# Patient Record
Sex: Male | Born: 1965 | Race: White | Hispanic: No | Marital: Married | Smoking: Current every day smoker
Health system: Southern US, Community
[De-identification: ages and names within clinical notes are randomized; demographics above are authoritative.]

## PROBLEM LIST (undated history)

## (undated) DIAGNOSIS — C189 Malignant neoplasm of colon, unspecified: Secondary | ICD-10-CM

## (undated) DIAGNOSIS — E782 Mixed hyperlipidemia: Secondary | ICD-10-CM

## (undated) DIAGNOSIS — C801 Malignant (primary) neoplasm, unspecified: Secondary | ICD-10-CM

## (undated) DIAGNOSIS — I429 Cardiomyopathy, unspecified: Secondary | ICD-10-CM

## (undated) DIAGNOSIS — J449 Chronic obstructive pulmonary disease, unspecified: Secondary | ICD-10-CM

## (undated) DIAGNOSIS — I10 Essential (primary) hypertension: Secondary | ICD-10-CM

## (undated) DIAGNOSIS — K432 Incisional hernia without obstruction or gangrene: Secondary | ICD-10-CM

## (undated) DIAGNOSIS — I350 Nonrheumatic aortic (valve) stenosis: Secondary | ICD-10-CM

## (undated) DIAGNOSIS — I2510 Atherosclerotic heart disease of native coronary artery without angina pectoris: Secondary | ICD-10-CM

## (undated) DIAGNOSIS — N529 Male erectile dysfunction, unspecified: Secondary | ICD-10-CM

## (undated) DIAGNOSIS — Z8701 Personal history of pneumonia (recurrent): Secondary | ICD-10-CM

## (undated) HISTORY — PX: LIVER RESECTION: SHX1977

## (undated) HISTORY — PX: COLOSTOMY: SHX63

## (undated) HISTORY — PX: HERNIA REPAIR: SHX51

## (undated) HISTORY — PX: COLON SURGERY: SHX602

## (undated) HISTORY — PX: COLOSTOMY REVERSAL: SHX5782

---

## 2014-03-29 DIAGNOSIS — R079 Chest pain, unspecified: Secondary | ICD-10-CM | POA: Insufficient documentation

## 2014-03-29 DIAGNOSIS — D696 Thrombocytopenia, unspecified: Secondary | ICD-10-CM | POA: Insufficient documentation

## 2015-04-25 DIAGNOSIS — M6208 Separation of muscle (nontraumatic), other site: Secondary | ICD-10-CM | POA: Insufficient documentation

## 2017-10-03 ENCOUNTER — Emergency Department (HOSPITAL_COMMUNITY)
Admission: EM | Admit: 2017-10-03 | Discharge: 2017-10-03 | Disposition: A | Discharge status: Home / Self Care | Payer: BLUE CROSS/BLUE SHIELD | Attending: Emergency Medicine | Admitting: Emergency Medicine

## 2017-10-03 ENCOUNTER — Emergency Department (HOSPITAL_COMMUNITY): Payer: BLUE CROSS/BLUE SHIELD

## 2017-10-03 ENCOUNTER — Encounter (HOSPITAL_COMMUNITY): Payer: Self-pay | Admitting: Emergency Medicine

## 2017-10-03 DIAGNOSIS — F17210 Nicotine dependence, cigarettes, uncomplicated: Secondary | ICD-10-CM | POA: Diagnosis not present

## 2017-10-03 DIAGNOSIS — R05 Cough: Secondary | ICD-10-CM | POA: Diagnosis present

## 2017-10-03 DIAGNOSIS — R1011 Right upper quadrant pain: Secondary | ICD-10-CM | POA: Insufficient documentation

## 2017-10-03 DIAGNOSIS — J189 Pneumonia, unspecified organism: Secondary | ICD-10-CM

## 2017-10-03 DIAGNOSIS — Z8505 Personal history of malignant neoplasm of liver: Secondary | ICD-10-CM | POA: Diagnosis not present

## 2017-10-03 DIAGNOSIS — J181 Lobar pneumonia, unspecified organism: Secondary | ICD-10-CM | POA: Insufficient documentation

## 2017-10-03 DIAGNOSIS — Z85858 Personal history of malignant neoplasm of other endocrine glands: Secondary | ICD-10-CM | POA: Diagnosis not present

## 2017-10-03 DIAGNOSIS — R1030 Lower abdominal pain, unspecified: Secondary | ICD-10-CM

## 2017-10-03 DIAGNOSIS — Z85038 Personal history of other malignant neoplasm of large intestine: Secondary | ICD-10-CM | POA: Insufficient documentation

## 2017-10-03 HISTORY — DX: Malignant (primary) neoplasm, unspecified: C80.1

## 2017-10-03 LAB — COMPREHENSIVE METABOLIC PANEL
ALK PHOS: 76 U/L (ref 38–126)
ALT: 24 U/L (ref 17–63)
ANION GAP: 10 (ref 5–15)
AST: 28 U/L (ref 15–41)
Albumin: 3.5 g/dL (ref 3.5–5.0)
BILIRUBIN TOTAL: 0.5 mg/dL (ref 0.3–1.2)
BUN: 10 mg/dL (ref 6–20)
CALCIUM: 8.6 mg/dL — AB (ref 8.9–10.3)
CO2: 22 mmol/L (ref 22–32)
Chloride: 102 mmol/L (ref 101–111)
Creatinine, Ser: 1.19 mg/dL (ref 0.61–1.24)
GFR calc Af Amer: >60 mL/min (ref >60)
GFR calc non Af Amer: >60 mL/min (ref >60)
GLUCOSE: 88 mg/dL (ref 65–99)
Potassium: 4.4 mmol/L (ref 3.5–5.1)
SODIUM: 134 mmol/L — AB (ref 135–145)
Total Protein: 6.6 g/dL (ref 6.5–8.1)

## 2017-10-03 LAB — CBC WITH DIFFERENTIAL/PLATELET
Basophils Absolute: 0 10*3/uL (ref 0.0–0.1)
Basophils Relative: 1 %
EOS ABS: 0 10*3/uL (ref 0.0–0.7)
Eosinophils Relative: 1 %
HEMATOCRIT: 46.6 % (ref 39.0–52.0)
HEMOGLOBIN: 15.2 g/dL (ref 13.0–17.0)
LYMPHS ABS: 1.3 10*3/uL (ref 0.7–4.0)
Lymphocytes Relative: 27 %
MCH: 30.5 pg (ref 26.0–34.0)
MCHC: 32.6 g/dL (ref 30.0–36.0)
MCV: 93.4 fL (ref 78.0–100.0)
MONO ABS: 0.7 10*3/uL (ref 0.1–1.0)
MONOS PCT: 15 %
NEUTROS ABS: 2.8 10*3/uL (ref 1.7–7.7)
NEUTROS PCT: 56 %
Platelets: 91 10*3/uL — ABNORMAL LOW (ref 150–400)
RBC: 4.99 MIL/uL (ref 4.22–5.81)
RDW: 13.8 % (ref 11.5–15.5)
WBC: 4.8 10*3/uL (ref 4.0–10.5)

## 2017-10-03 LAB — LIPASE, BLOOD: Lipase: 28 U/L (ref 11–51)

## 2017-10-03 MED ORDER — MORPHINE SULFATE (PF) 4 MG/ML IV SOLN
4.0000 mg | Freq: Once | INTRAVENOUS | Status: AC
  Administered 2017-10-03: 4 mg via INTRAVENOUS
  Filled 2017-10-03: qty 1

## 2017-10-03 MED ORDER — AZITHROMYCIN 250 MG PO TABS
500.0000 mg | ORAL_TABLET | Freq: Once | ORAL | Status: AC
  Administered 2017-10-03: 500 mg via ORAL
  Filled 2017-10-03: qty 2

## 2017-10-03 MED ORDER — BENZONATATE 100 MG PO CAPS
200.0000 mg | ORAL_CAPSULE | Freq: Once | ORAL | Status: AC
  Administered 2017-10-03: 200 mg via ORAL
  Filled 2017-10-03: qty 2

## 2017-10-03 MED ORDER — FENTANYL CITRATE (PF) 100 MCG/2ML IJ SOLN
50.0000 ug | Freq: Once | INTRAMUSCULAR | Status: AC
  Administered 2017-10-03: 50 ug via INTRAVENOUS
  Filled 2017-10-03: qty 2

## 2017-10-03 MED ORDER — ALBUTEROL SULFATE (2.5 MG/3ML) 0.083% IN NEBU: 2.5000 mg | INHALATION_SOLUTION | Freq: Four times a day (QID) | RESPIRATORY_TRACT | 0 refills | Status: DC | PRN

## 2017-10-03 MED ORDER — IPRATROPIUM-ALBUTEROL 0.5-2.5 (3) MG/3ML IN SOLN
3.0000 mL | Freq: Once | RESPIRATORY_TRACT | Status: AC
  Administered 2017-10-03: 3 mL via RESPIRATORY_TRACT
  Filled 2017-10-03: qty 3

## 2017-10-03 MED ORDER — AZITHROMYCIN 250 MG PO TABS: ORAL_TABLET | ORAL | 0 refills | Status: DC

## 2017-10-03 MED ORDER — AMOXICILLIN-POT CLAVULANATE 875-125 MG PO TABS
1.0000 | ORAL_TABLET | Freq: Once | ORAL | Status: AC
  Administered 2017-10-03: 1 via ORAL
  Filled 2017-10-03: qty 1

## 2017-10-03 MED ORDER — ONDANSETRON HCL 4 MG/2ML IJ SOLN
4.0000 mg | Freq: Once | INTRAMUSCULAR | Status: AC
  Administered 2017-10-03: 4 mg via INTRAVENOUS
  Filled 2017-10-03: qty 2

## 2017-10-03 MED ORDER — HYDROCOD POLST-CPM POLST ER 10-8 MG/5ML PO SUER: 5.0000 mL | Freq: Two times a day (BID) | ORAL | 0 refills | Status: DC

## 2017-10-03 MED ORDER — AMOXICILLIN-POT CLAVULANATE 875-125 MG PO TABS: 1.0000 | ORAL_TABLET | Freq: Two times a day (BID) | ORAL | 0 refills | Status: DC

## 2017-10-03 NOTE — ED Provider Notes (Signed)
[facility] EMERGENCY DEPARTMENT Provider Note   CSN: [id number] Arrival date & time: 10/03/17  0631     History   Chief Complaint Chief Complaint  Patient presents with  . Cough  . Fever    HPI Bob Ross is a 52 y.o. male.  HPI   Bob Ross is a 52 y.o. male with hx of multiple abdominal surgeries secondary to colon surgery, colostomy, and hernia repair with mesh placement.  He presents to the Emergency Department complaining of cough, generalized body aches and fever.  Symptoms began 2 days ago.  Fever at home of 101 this morning before taking tylenol.  Cough reported as mostly non-productive, but states he has been coughing excessively and believes he may have "torn" his abdominal mesh. Cough has been associated with nasal congestion.  Denies ear pain, sore throat.   He complains of sharp, stabbing pain to his upper and right abdomen that worsens with cough.  He denies chest pain, shortness of breath, vomiting, diarrhea and constipation.  No flu vaccine this year.  Pt is a smoker    Past Medical History:  Diagnosis Date  . 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   Past Surgical History:  Procedure Laterality Date  . COLON SURGERY    . COLOSTOMY    . COLOSTOMY REVERSAL    . HERNIA REPAIR    . LIVER RESECTION         Home Medications    Prior to Admission medications   Not on File    Family History History reviewed. No pertinent family history.  Social History Social History   Tobacco Use  . Smoking status: Current Every Day Smoker    Packs/day: 2.00    Types: Cigarettes  Substance Use Topics  . Alcohol use: Yes    Comment: occasional  . Drug use: No     Allergies   Patient has no known allergies.   Review of Systems Review of Systems  Constitutional: Negative for appetite change, chills and fever.  HENT: Positive for congestion. Negative for sore throat and trouble swallowing.     Respiratory: Positive for cough. Negative for chest tightness, shortness of breath and wheezing.   Cardiovascular: Negative for chest pain.  Gastrointestinal: Positive for abdominal pain. Negative for abdominal distention, constipation, diarrhea, nausea and vomiting.  Genitourinary: Negative for dysuria and flank pain.  Musculoskeletal: Negative for arthralgias.  Skin: Negative for rash.  Neurological: Negative for dizziness, weakness and numbness.  Hematological: Negative for adenopathy.  All other systems reviewed and are negative.    Physical Exam Updated Vital Signs BP 122/78   Pulse 74   Temp 99.4 F (37.4 C) (Oral)   Resp 18   Ht 5\' 11"  (1.803 m)   Wt 93 kg (205 lb)   SpO2 96%   BMI 28.59 kg/m   Physical Exam  Constitutional: He is oriented to person, place, and time. He appears well-developed and well-nourished. No distress.  HENT:  Head: Normocephalic and atraumatic.  Right Ear: Tympanic membrane and ear canal normal.  Left Ear: Tympanic membrane and ear canal normal.  Mouth/Throat: Uvula is midline, oropharynx is clear and moist and mucous membranes are normal. No oropharyngeal exudate.  Eyes: EOM are normal. Pupils are equal, round, and reactive to light.  Neck: Normal range of motion, full passive range of motion without pain and phonation normal. Neck supple.  Cardiovascular: Normal rate,  regular rhythm, normal heart sounds and intact distal pulses.  No murmur heard. Pulmonary/Chest: Effort normal. No stridor. No respiratory distress. He has wheezes. He has no rales. He exhibits no tenderness.  Few crackles present at the right base.  Also, few expiratory wheezes throughout.    Abdominal: Soft. He exhibits no distension and no mass. There is tenderness. There is no rebound and no guarding.  Mild tenderness to palpation of the right lower abdomen.  No guarding or rebound tenderness.  No palpable hernias of the abdominal wall  Musculoskeletal: Normal range of  motion. He exhibits no edema.  Lymphadenopathy:    He has no cervical adenopathy.  Neurological: He is alert and oriented to person, place, and time. He exhibits normal muscle tone. Coordination normal.  Skin: Skin is warm and dry. Capillary refill takes less than 2 seconds. No rash noted.  Psychiatric: He has a normal mood and affect.  Nursing note and vitals reviewed.    ED Treatments / Results  Labs (all labs ordered are listed, but only abnormal results are displayed) Labs Reviewed  COMPREHENSIVE METABOLIC PANEL - Abnormal; Notable for the following components:      Result Value   Sodium 134 (*)    Calcium 8.6 (*)    All other components within normal limits  CBC WITH DIFFERENTIAL/PLATELET - Abnormal; Notable for the following components:   Platelets 91 (*)    All other components within normal limits  LIPASE, BLOOD    EKG  EKG Interpretation None       Radiology Ct Abdomen Pelvis Wo Contrast  Result Date: 10/03/2017 CLINICAL DATA:  52 year old male with a history of prior ventral abdominal mesh hernia repair and new onset abdominal pain after coughing. EXAM: CT ABDOMEN AND PELVIS WITHOUT CONTRAST TECHNIQUE: Multidetector CT imaging of the abdomen and pelvis was performed following the standard protocol without IV contrast. COMPARISON:  None. FINDINGS: Lower chest: Mild linear atelectasis in the right lower lobe. Otherwise, the lungs are clear. No pleural effusion or pneumothorax. The visualized cardiac structures are within normal limits for size. Unremarkable distal thoracic esophagus. Hepatobiliary: Normal hepatic contour and morphology. Linear calcification in the right hepatic lobe may represent sequelae from prior intervention, or old granulomatous disease. No intra or extrahepatic biliary ductal dilatation. A solitary radiopaque stone is present in the gallbladder neck. Pancreas: Unremarkable. No pancreatic ductal dilatation or surrounding inflammatory changes. Spleen:  Normal in size without focal abnormality. Adrenals/Urinary Tract: Atrophic native right kidney. There are a few punctate calcifications affiliated with the right adrenal gland. The left adrenal gland and kidney are normal in appearance. No evidence of hydronephrosis, nephrolithiasis or exophytic renal mass. The ureters are unremarkable. The bladder is within normal limits save for a few punctate calcifications in the region of the urachus. Stomach/Bowel: History of prior sigmoid colonic resection. The colocolonic anastomosis is widely patent. No focal bowel wall thickening or obstruction. Calcified mesenteric mass in the right lower quadrant measures approximately 2.2 x 5.2 cm. There is a second smaller calcified mass in the right lower quadrant small bowel mesentery which measures 1.4 x 1.4 cm. Vascular/Lymphatic: Limited evaluation in the absence of intravenous contrast. Scattered atherosclerotic calcifications including along the abdominal aorta. No aneurysm. Calcified upper abdominal lymph nodes in the gastrohepatic ligament, the largest measures 2.1 x 2.1 cm. Reproductive: Unremarkable prostate gland. Other: Significant diastasis recti with evidence of prior large ventral hernia repair with mesh. The repair appears predominantly intact. There may be a single defect in the left  anterolateral lower abdomen were a small amount of omental fat protrudes through the mesh. Multiple loops of small bowel are closely adherent to the undersurface of the mesh. Suspect underlying adhesive disease. Musculoskeletal: No acute fracture or aggressive appearing lytic or blastic osseous lesion. IMPRESSION: 1. Evidence of prior large ventral hernia repair with mesh. There is 1 site of possible recurrent herniation of omental fat in the anterolateral aspect of the lower abdominal wall. The acuity of this abnormality can't be determined without prior imaging for comparison. If this corresponds with the location of the patient's  clinical symptoms, it may be acute. 2. Evidence of prior treated colon cancer. The patient has had a sigmoid colectomy and there are calcifications within the liver, gastrohepatic nodal station and within the small bowel mesentery of the right lower quadrant which likely represent treated metastatic disease. Again, correlation with prior imaging would be helpful to ensure stability. No suspicious uncalcified lymphadenopathy or soft tissue masses identified. 3. 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 4.  Aortic Atherosclerosis (ICD10-170.0). 5. Cholelithiasis. Electronically Signed   By: Jacqulynn Cadet M.D.   On: 10/03/2017 09:36   Dg Chest 2 View  Result Date: 10/03/2017 CLINICAL DATA:  Cough and fever for 1 day. EXAM: CHEST  2 VIEW COMPARISON:  None. FINDINGS: The cardiomediastinal silhouette is unremarkable. Streaky opacities within the right lower lung are noted with small right pleural effusion/pleural thickening. A peripheral opacity within the right lower lung noted. The left lung is clear. COPD/emphysema noted. There is no evidence of pneumothorax. No acute bony abnormalities are present. IMPRESSION: Streaky right lower lung opacities with small right pleural effusion/pleural thickening. This is of uncertain chronicity but could represent pneumonia with small parapneumonic effusion. Peripheral opacity within the right lower lung which could represent scarring changes or atelectasis. Recommend comparison with any prior outside studies. If these cannot be obtained, radiographic follow-up to resolution is recommended. COPD/emphysema. Electronically Signed   By: Margarette Canada M.D.   On: 10/03/2017 07:53    Procedures Procedures (including critical care time)  Medications Ordered in ED Medications  ipratropium-albuterol (DUONEB) 0.5-2.5 (3) MG/3ML nebulizer solution 3 mL (3 mLs Nebulization Given 10/03/17 0910)  morphine 4 MG/ML injection 4 mg (4 mg  Intravenous Given 10/03/17 0842)  ondansetron (ZOFRAN) injection 4 mg (4 mg Intravenous Given 10/03/17 0842)  fentaNYL (SUBLIMAZE) injection 50 mcg (50 mcg Intravenous Given 10/03/17 1020)  amoxicillin-clavulanate (AUGMENTIN) 875-125 MG per tablet 1 tablet (1 tablet Oral Given 10/03/17 1019)  azithromycin (ZITHROMAX) tablet 500 mg (500 mg Oral Given 10/03/17 1019)  benzonatate (TESSALON) capsule 200 mg (200 mg Oral Given 10/03/17 1019)     Initial Impression / Assessment and Plan / ED Course  I have reviewed the triage vital signs and the nursing notes.  Pertinent labs & imaging results that were available during my care of the patient were reviewed by me and considered in my medical decision making (see chart for details).    On recheck, lung sounds have improved after albuterol.  Patient overall well-appearing.  Vitals reviewed.  Low-grade fever without tachypnea, tachycardia, or hypoxia.  Discussed imaging results with patient, pain to the abdomen is epigastric and right lower quadrant. small possible compromise of the mesh on the left felt to be related to previous surgeries and given that patient's symptoms are epigastric and right lower abdomen.  There are no previous imaging results or laboratory studies in his medical records for comparison  Feeling better after medication.  Patient appears stable for outpatient treatment of pneumonia  Pt ambulated in the dept maintained O2 sat of 95% on RA.  Patient agrees to treatment plan, appears safe for discharge home, strict return precautions were discussed      Final Clinical Impressions(s) / ED Diagnoses   Final diagnoses:  Community acquired pneumonia of right lower lobe of lung [facility])  Lower abdominal pain    ED Discharge Orders    None       Kem Parkinson, PA-C 10/04/17 2053    Francine Graven, DO 10/05/17 [phone]

## 2017-10-03 NOTE — Discharge Instructions (Signed)
Drink plenty of fluids, use the albuterol nebulizer 1 treatment every 4-6 hours as needed.  Be sure to take the antibiotic as directed until finished.  Follow-up with your primary doctor in 2-3 weeks to ensure resolution of the pneumonia.  As discussed, your CT of your abdomen shows a possible area of compromise to the mesh, he will need to arrange follow-up with your surgeon.  Return to the ER for any worsening symptoms such as persistent fever, vomiting, and shortness of breath.

## 2017-10-03 NOTE — ED Notes (Signed)
RT contacted regarding DuoNeb order.

## 2017-10-03 NOTE — ED Notes (Signed)
Patient transported to CT 

## 2017-10-03 NOTE — ED Triage Notes (Signed)
Pt reports having a cough and fever since yesterday.  States hx of several abd sx with mesh repair and feels like he "did some damage" to abdomen while coughing.

## 2017-10-09 ENCOUNTER — Emergency Department (HOSPITAL_COMMUNITY): Payer: BLUE CROSS/BLUE SHIELD

## 2017-10-09 ENCOUNTER — Emergency Department (HOSPITAL_COMMUNITY)
Admission: EM | Admit: 2017-10-09 | Discharge: 2017-10-09 | Disposition: A | Discharge status: Home / Self Care | Payer: BLUE CROSS/BLUE SHIELD | Attending: Emergency Medicine | Admitting: Emergency Medicine

## 2017-10-09 ENCOUNTER — Encounter (HOSPITAL_COMMUNITY): Payer: Self-pay

## 2017-10-09 DIAGNOSIS — F17210 Nicotine dependence, cigarettes, uncomplicated: Secondary | ICD-10-CM | POA: Diagnosis not present

## 2017-10-09 DIAGNOSIS — R0602 Shortness of breath: Secondary | ICD-10-CM | POA: Diagnosis not present

## 2017-10-09 DIAGNOSIS — R05 Cough: Secondary | ICD-10-CM | POA: Insufficient documentation

## 2017-10-09 DIAGNOSIS — Z79899 Other long term (current) drug therapy: Secondary | ICD-10-CM | POA: Insufficient documentation

## 2017-10-09 DIAGNOSIS — R059 Cough, unspecified: Secondary | ICD-10-CM

## 2017-10-09 DIAGNOSIS — J441 Chronic obstructive pulmonary disease with (acute) exacerbation: Secondary | ICD-10-CM | POA: Insufficient documentation

## 2017-10-09 DIAGNOSIS — R509 Fever, unspecified: Secondary | ICD-10-CM | POA: Diagnosis present

## 2017-10-09 LAB — COMPREHENSIVE METABOLIC PANEL
ALBUMIN: 3.3 g/dL — AB (ref 3.5–5.0)
ALK PHOS: 79 U/L (ref 38–126)
ALT: 20 U/L (ref 17–63)
AST: 21 U/L (ref 15–41)
Anion gap: 9 (ref 5–15)
BILIRUBIN TOTAL: 0.5 mg/dL (ref 0.3–1.2)
BUN: 11 mg/dL (ref 6–20)
CALCIUM: 9.1 mg/dL (ref 8.9–10.3)
CO2: 26 mmol/L (ref 22–32)
CREATININE: 1.12 mg/dL (ref 0.61–1.24)
Chloride: 103 mmol/L (ref 101–111)
GFR calc Af Amer: >60 mL/min (ref >60)
GFR calc non Af Amer: >60 mL/min (ref >60)
GLUCOSE: 91 mg/dL (ref 65–99)
Potassium: 4.6 mmol/L (ref 3.5–5.1)
Sodium: 138 mmol/L (ref 135–145)
TOTAL PROTEIN: 7.2 g/dL (ref 6.5–8.1)

## 2017-10-09 LAB — CBC WITH DIFFERENTIAL/PLATELET
BASOS ABS: 0 10*3/uL (ref 0.0–0.1)
Basophils Relative: 0 %
Eosinophils Absolute: 0.1 10*3/uL (ref 0.0–0.7)
Eosinophils Relative: 2 %
HEMATOCRIT: 48.9 % (ref 39.0–52.0)
Hemoglobin: 15.9 g/dL (ref 13.0–17.0)
LYMPHS ABS: 2.3 10*3/uL (ref 0.7–4.0)
Lymphocytes Relative: 39 %
MCH: 30.3 pg (ref 26.0–34.0)
MCHC: 32.5 g/dL (ref 30.0–36.0)
MCV: 93.3 fL (ref 78.0–100.0)
MONO ABS: 0.6 10*3/uL (ref 0.1–1.0)
Monocytes Relative: 10 %
NEUTROS ABS: 3 10*3/uL (ref 1.7–7.7)
Neutrophils Relative %: 49 %
PLATELETS: 104 10*3/uL — AB (ref 150–400)
RBC: 5.24 MIL/uL (ref 4.22–5.81)
RDW: 13.7 % (ref 11.5–15.5)
WBC: 6 10*3/uL (ref 4.0–10.5)

## 2017-10-09 LAB — INFLUENZA PANEL BY PCR (TYPE A & B)
Influenza A By PCR: NEGATIVE
Influenza B By PCR: NEGATIVE

## 2017-10-09 LAB — BRAIN NATRIURETIC PEPTIDE: B NATRIURETIC PEPTIDE 5: 19 pg/mL (ref 0.0–100.0)

## 2017-10-09 LAB — I-STAT CG4 LACTIC ACID, ED: LACTIC ACID, VENOUS: 1.02 mmol/L (ref 0.5–1.9)

## 2017-10-09 MED ORDER — IPRATROPIUM-ALBUTEROL 0.5-2.5 (3) MG/3ML IN SOLN
3.0000 mL | Freq: Once | RESPIRATORY_TRACT | Status: AC
  Administered 2017-10-09: 3 mL via RESPIRATORY_TRACT
  Filled 2017-10-09: qty 3

## 2017-10-09 MED ORDER — BENZONATATE 100 MG PO CAPS: 100.0000 mg | ORAL_CAPSULE | Freq: Three times a day (TID) | ORAL | 0 refills | Status: DC

## 2017-10-09 MED ORDER — HYDROCOD POLST-CPM POLST ER 10-8 MG/5ML PO SUER: 5.0000 mL | Freq: Every evening | ORAL | 0 refills | Status: DC | PRN

## 2017-10-09 MED ORDER — PREDNISONE 20 MG PO TABS: 40.0000 mg | ORAL_TABLET | Freq: Every day | ORAL | 0 refills | Status: AC

## 2017-10-09 MED ORDER — SODIUM CHLORIDE 0.9 % IV BOLUS (SEPSIS): 1000.0000 mL | Freq: Once | INTRAVENOUS | Status: DC

## 2017-10-09 NOTE — ED Notes (Signed)
resp called.

## 2017-10-09 NOTE — Discharge Instructions (Signed)
Please use your breathing treatment every 4-6 hours as needed.  Please make sure that you use it first thing in the morning, and immediately before you go to bed.  Please stop smoking.  I have given you a prescription for steroids today.  Some common side effects include feelings of extra energy, feeling warm, increased appetite, and stomach upset.  If you are diabetic your sugars may run higher than usual.

## 2017-10-09 NOTE — ED Notes (Signed)
Pt is clear voiced, non dyspneic,   Reports no flu shot this year  Has not followed with CP since last sat   To Rad

## 2017-10-09 NOTE — ED Provider Notes (Signed)
[facility] EMERGENCY DEPARTMENT Provider Note   CSN: [id number] Arrival date & time: 10/09/17  0935     History   Chief Complaint Chief Complaint  Patient presents with  . Pneumonia  . Follow-up    HPI Bob Ross is a 52 y.o. male who presents today for follow-up of diagnosed pneumonia.  He was seen here on 2/9 (5 days ago) and diagnosed with pneumonia.  He reports compliance with his azithromycin and Augmentin, along with albuterol nebulizers.  He reports that he had maybe one day when he felt a little bit better, however generally reports no change.  His wife reports that he has lost approximately 10 pounds in the past week.  He denies any nausea vomiting or diarrhea.  Wife reports that he is having "low-grade" fevers, however never over 100.  Wife reports that when they measured his oxygen saturation at home today it was in the 80s.  Patient has smoked approximately 2 packs a day for 39 years. Patient has a history of colon cancer that spread to his liver.    HPI  Past Medical History:  Diagnosis Date  . 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   Past Surgical History:  Procedure Laterality Date  . COLON SURGERY    . COLOSTOMY    . COLOSTOMY REVERSAL    . HERNIA REPAIR    . LIVER RESECTION         Home Medications    Prior to Admission medications   Medication Sig Start Date End Date Taking? Authorizing Provider  albuterol (PROVENTIL) (2.5 MG/3ML) 0.083% nebulizer solution Take 3 mLs (2.5 mg total) by nebulization every 6 (six) hours as needed for wheezing or shortness of breath. 10/03/17  Yes Triplett, Tammy, PA-C  amoxicillin-clavulanate (AUGMENTIN) 875-125 MG tablet Take 1 tablet by mouth every 12 (twelve) hours. 10/03/17  Yes Triplett, Tammy, PA-C  aspirin EC 81 MG tablet Take 81 mg by mouth daily.   Yes [provider]  atorvastatin (LIPITOR) 80 MG tablet Take 80 mg by mouth daily at 6 PM.   09/28/17  Yes [provider]  azithromycin (ZITHROMAX) 250 MG tablet Take first 2 tablets together, then 1 every day until finished. 10/03/17  Yes Triplett, Tammy, PA-C  benzonatate (TESSALON) 100 MG capsule Take 1 capsule (100 mg total) by mouth every 8 (eight) hours. 10/09/17   Lorin Glass, PA-C  chlorpheniramine-HYDROcodone (TUSSIONEX PENNKINETIC ER) 10-8 MG/5ML SUER Take 5 mLs by mouth at bedtime as needed for cough. 10/09/17   Lorin Glass, PA-C  predniSONE (DELTASONE) 20 MG tablet Take 2 tablets (40 mg total) by mouth daily for 5 days. 10/09/17 10/14/17  Lorin Glass, PA-C    Family History No family history on file.  Social History Social History   Tobacco Use  . Smoking status: Current Every Day Smoker    Packs/day: 2.00    Types: Cigarettes  Substance Use Topics  . Alcohol use: Yes    Comment: occasional  . Drug use: No     Allergies   Patient has no known allergies.   Review of Systems Review of Systems  Constitutional: Positive for activity change, appetite change, chills, fatigue, fever and unexpected weight change.  HENT: Positive for congestion and sore throat.   Respiratory: Positive for cough and shortness of breath. Negative for chest tightness and wheezing.   Cardiovascular: Negative for chest pain, palpitations  and leg swelling.  Gastrointestinal: Positive for abdominal pain. Negative for diarrhea, nausea and vomiting.  Genitourinary: Negative for decreased urine volume, dysuria and hematuria.  Musculoskeletal: Positive for arthralgias and myalgias.  Skin: Negative for rash and wound.  Neurological: Positive for weakness ("all over"). Negative for speech difficulty and headaches.  All other systems reviewed and are negative.    Physical Exam Updated Vital Signs BP 123/77   Pulse 79   Temp 97.8 F (36.6 C) (Oral)   Resp (!) 22   Wt 90.3 kg (199 lb)   SpO2 95%   BMI 27.75 kg/m   Physical Exam  Constitutional: He  appears well-developed and well-nourished. No distress.  HENT:  Head: Normocephalic and atraumatic.  Mouth/Throat: Oropharynx is clear and moist.  Eyes: Conjunctivae are normal. Right eye exhibits no discharge. Left eye exhibits no discharge. No scleral icterus.  Neck: Normal range of motion. Neck supple.  Cardiovascular: Normal rate, regular rhythm, normal heart sounds and intact distal pulses.  No murmur heard. Pulmonary/Chest: Effort normal. No stridor. No respiratory distress.  Diffuse wheezes/rales through all lung fields.   Abdominal: Soft. Bowel sounds are normal. He exhibits no distension. There is no tenderness.  Musculoskeletal: He exhibits no edema or deformity.  No leg swelling.   Neurological: He is alert. He exhibits normal muscle tone.  Skin: Skin is warm and dry. He is not diaphoretic.  Psychiatric: He has a normal mood and affect. His behavior is normal.  Nursing note and vitals reviewed.    ED Treatments / Results  Labs (all labs ordered are listed, but only abnormal results are displayed) Labs Reviewed  COMPREHENSIVE METABOLIC PANEL - Abnormal; Notable for the following components:      Result Value   Albumin 3.3 (*)    All other components within normal limits  CBC WITH DIFFERENTIAL/PLATELET - Abnormal; Notable for the following components:   Platelets 104 (*)    All other components within normal limits  BRAIN NATRIURETIC PEPTIDE  INFLUENZA PANEL BY PCR (TYPE A & B)  I-STAT CG4 LACTIC ACID, ED    EKG  EKG Interpretation None       Radiology Dg Chest 2 View  Result Date: 10/09/2017 CLINICAL DATA:  Productive cough and congestion. EXAM: CHEST  2 VIEW COMPARISON:  10/03/2017 FINDINGS: The cardiac silhouette, mediastinal and hilar contours are normal in stable. Persistent right lower lobe process which appears to be chronic scarring changes and pleural thickening. The left lung is clear. The bony structures are intact. IMPRESSION: Chronic appearing right  basilar scarring changes and pleural thickening. No obvious infiltrate. Electronically Signed   By: Marijo Sanes M.D.   On: 10/09/2017 11:50    Procedures Procedures (including critical care time)  Medications Ordered in ED Medications  ipratropium-albuterol (DUONEB) 0.5-2.5 (3) MG/3ML nebulizer solution 3 mL (3 mLs Nebulization Given 10/09/17 1101)     Initial Impression / Assessment and Plan / ED Course  I have reviewed the triage vital signs and the nursing notes.  Pertinent labs & imaging results that were available during my care of the patient were reviewed by me and considered in my medical decision making (see chart for details).  Clinical Course as of Oct 09 1838  Fri Oct 09, 2017  1128 Lungs are now greatly improved, there is still mild Rales in bilateral lower fields, left worse than right.  [EH]  1138 Asked by RN to cancel IV fluids as patient is normal vitals, able to tolerate Po.   [  EH]  1300 Patient reevaluated, no respiratory distress, resting comfortably at this time.  Discussed discharge plan.  [EH]    Clinical Course User Index [EH] Lorin Glass, PA-C   Landis Martins presents today for evaluation of pneumonia.  He was previously diagnosed with pneumonia and started on azithromycin and Augmentin.  He reports that he is not getting better as he expects that he should be.  He is continuing to have significant cough, and his wife reports that he has lost approximately 10 pounds.  Labs were obtained, no significant leukocytosis.  Platelets are low, however improved since his labs 6 days ago.  No significant electrolyte abnormalities.  Based on reported consistent cough BNP was obtained which was not elevated.  Actiq acid normal, influenza a and B negative.  Chest x-ray shows improvement in pneumonia.  The course and expectations for pneumonia resolution were discussed with the patient and his wife.  Patient has been on appropriate antibiotic therapy.  Given his history of  approximately 80 pack years, will add on steroids to treat this as a acute exacerbation of COPD.  He reports that he has sufficient albuterol nebs at home, was not using them, was encouraged to use them.  He was given a small amount of cough syrup, as he reports that is the only thing that allows him to sleep.  He was informed to only use this at night, and given Tessalon to use during the day.  He was instructed to follow-up with his primary care provider.  He was maintaining his sats on room air.  I do not suspect PE based on course, symptoms, wells score 0 points, perc score 0 points.   At this time there does not appear to be any evidence of an acute emergency medical condition and the patient appears stable for discharge with appropriate outpatient follow up.Diagnosis was discussed with patient who verbalizes understanding and is agreeable to discharge. Pt case discussed with Dr. Lita Mains who agrees with my plan.       Final Clinical Impressions(s) / ED Diagnoses   Final diagnoses:  COPD with acute exacerbation ([place])  Cough    ED Discharge Orders        Ordered    chlorpheniramine-HYDROcodone (TUSSIONEX PENNKINETIC ER) 10-8 MG/5ML SUER  At bedtime PRN     10/09/17 1302    benzonatate (TESSALON) 100 MG capsule  Every 8 hours     10/09/17 1302    predniSONE (DELTASONE) 20 MG tablet  Daily     10/09/17 1302       Lorin Glass, [place] 10/09/17 1840    Julianne Rice, MD 10/10/17 2149

## 2017-10-09 NOTE — ED Notes (Signed)
Pt offered POs and crackers- spouse reports that he wanted a coke-  When informed that department has no cokes, spouse report she will have to go to vending machines

## 2017-10-09 NOTE — ED Triage Notes (Signed)
Pt in for recheck . Reports he was seen last Sat for pneumonia and does not feel like he is improving. conts to cough and SOB

## 2018-08-13 DIAGNOSIS — S46212A Strain of muscle, fascia and tendon of other parts of biceps, left arm, initial encounter: Secondary | ICD-10-CM | POA: Insufficient documentation

## 2020-01-19 DIAGNOSIS — R0602 Shortness of breath: Secondary | ICD-10-CM | POA: Insufficient documentation

## 2020-01-19 DIAGNOSIS — I2510 Atherosclerotic heart disease of native coronary artery without angina pectoris: Secondary | ICD-10-CM | POA: Insufficient documentation

## 2021-03-29 ENCOUNTER — Emergency Department (HOSPITAL_COMMUNITY)
Admission: EM | Admit: 2021-03-29 | Discharge: 2021-03-29 | Disposition: A | Discharge status: Home / Self Care | Payer: BC Managed Care – PPO | Attending: Emergency Medicine | Admitting: Emergency Medicine

## 2021-03-29 ENCOUNTER — Encounter (HOSPITAL_COMMUNITY): Payer: Self-pay | Admitting: *Deleted

## 2021-03-29 ENCOUNTER — Other Ambulatory Visit: Payer: Self-pay

## 2021-03-29 ENCOUNTER — Emergency Department (HOSPITAL_COMMUNITY): Payer: BC Managed Care – PPO

## 2021-03-29 DIAGNOSIS — R5383 Other fatigue: Secondary | ICD-10-CM | POA: Insufficient documentation

## 2021-03-29 DIAGNOSIS — Z7951 Long term (current) use of inhaled steroids: Secondary | ICD-10-CM | POA: Diagnosis not present

## 2021-03-29 DIAGNOSIS — Z7982 Long term (current) use of aspirin: Secondary | ICD-10-CM | POA: Insufficient documentation

## 2021-03-29 DIAGNOSIS — Z79899 Other long term (current) drug therapy: Secondary | ICD-10-CM | POA: Insufficient documentation

## 2021-03-29 DIAGNOSIS — J441 Chronic obstructive pulmonary disease with (acute) exacerbation: Secondary | ICD-10-CM | POA: Diagnosis not present

## 2021-03-29 DIAGNOSIS — Z85038 Personal history of other malignant neoplasm of large intestine: Secondary | ICD-10-CM | POA: Insufficient documentation

## 2021-03-29 DIAGNOSIS — F17210 Nicotine dependence, cigarettes, uncomplicated: Secondary | ICD-10-CM | POA: Diagnosis not present

## 2021-03-29 DIAGNOSIS — I10 Essential (primary) hypertension: Secondary | ICD-10-CM | POA: Insufficient documentation

## 2021-03-29 DIAGNOSIS — R0602 Shortness of breath: Secondary | ICD-10-CM | POA: Diagnosis present

## 2021-03-29 HISTORY — DX: Essential (primary) hypertension: I10

## 2021-03-29 HISTORY — DX: Chronic obstructive pulmonary disease, unspecified: J44.9

## 2021-03-29 LAB — CBC
HCT: 52.2 % — ABNORMAL HIGH (ref 39.0–52.0)
Hemoglobin: 17.3 g/dL — ABNORMAL HIGH (ref 13.0–17.0)
MCH: 31.3 pg (ref 26.0–34.0)
MCHC: 33.1 g/dL (ref 30.0–36.0)
MCV: 94.4 fL (ref 80.0–100.0)
Platelets: 121 10*3/uL — ABNORMAL LOW (ref 150–400)
RBC: 5.53 MIL/uL (ref 4.22–5.81)
RDW: 14 % (ref 11.5–15.5)
WBC: 7.2 10*3/uL (ref 4.0–10.5)
nRBC: 0 % (ref 0.0–0.2)

## 2021-03-29 LAB — BASIC METABOLIC PANEL
Anion gap: 7 (ref 5–15)
BUN: 18 mg/dL (ref 6–20)
CO2: 27 mmol/L (ref 22–32)
Calcium: 9 mg/dL (ref 8.9–10.3)
Chloride: 107 mmol/L (ref 98–111)
Creatinine, Ser: 1.11 mg/dL (ref 0.61–1.24)
GFR, Estimated: >60 mL/min (ref >60)
Glucose, Bld: 92 mg/dL (ref 70–99)
Potassium: 4.5 mmol/L (ref 3.5–5.1)
Sodium: 141 mmol/L (ref 135–145)

## 2021-03-29 LAB — TROPONIN I (HIGH SENSITIVITY): Troponin I (High Sensitivity): 16 ng/L (ref <18)

## 2021-03-29 MED ORDER — DOXYCYCLINE HYCLATE 100 MG PO TABS
100.0000 mg | ORAL_TABLET | Freq: Once | ORAL | Status: AC
  Administered 2021-03-29: 100 mg via ORAL
  Filled 2021-03-29: qty 1

## 2021-03-29 MED ORDER — DOXYCYCLINE HYCLATE 100 MG PO CAPS: 100.0000 mg | ORAL_CAPSULE | Freq: Two times a day (BID) | ORAL | 0 refills | Status: DC

## 2021-03-29 MED ORDER — AEROCHAMBER PLUS FLO-VU MEDIUM MISC: 1.0000 | Freq: Once | Status: DC

## 2021-03-29 MED ORDER — PREDNISONE 20 MG PO TABS: 40.0000 mg | ORAL_TABLET | Freq: Every day | ORAL | 0 refills | Status: DC

## 2021-03-29 MED ORDER — ALBUTEROL SULFATE HFA 108 (90 BASE) MCG/ACT IN AERS
6.0000 | INHALATION_SPRAY | Freq: Once | RESPIRATORY_TRACT | Status: AC
  Administered 2021-03-29: 6 via RESPIRATORY_TRACT
  Filled 2021-03-29: qty 6.7

## 2021-03-29 MED ORDER — PREDNISONE 20 MG PO TABS
40.0000 mg | ORAL_TABLET | Freq: Once | ORAL | Status: AC
Start: 2021-03-29 — End: 2021-03-29
  Administered 2021-03-29: 40 mg via ORAL
  Filled 2021-03-29: qty 2

## 2021-03-29 MED ORDER — BENZONATATE 100 MG PO CAPS: 100.0000 mg | ORAL_CAPSULE | Freq: Three times a day (TID) | ORAL | 0 refills | Status: DC

## 2021-03-29 MED ORDER — ALBUTEROL SULFATE HFA 108 (90 BASE) MCG/ACT IN AERS: 2.0000 | INHALATION_SPRAY | RESPIRATORY_TRACT | 3 refills | Status: AC | PRN

## 2021-03-29 NOTE — ED Triage Notes (Signed)
C/o shortness of breath and pain in chest when breathing, recent covid exposure

## 2021-03-29 NOTE — Discharge Instructions (Addendum)
Your testing shows no pneumonia  Please take 2 puffs of albuterol every 4 hours for the first 24 hours, then every 4 hours as needed.  This medicine will help to open up your lungs and allow you breathe easier, cough less and have less tightness in your chest.  This medicine can be taken with you - it is small and portable.    Prednisone is a steroid that helps to reduce certain types of inflammation and may be used for allergic reactions, some rashes such as poison ivy or dermatitis, for asthma attacks or bronchitis and for certain types of pain.  Please take this medicine exactly as prescribed - '40mg'$  by mouth daily for 5 days.  This can have certain side effects with some people including feeling like you can't sleep, feeling anxious or feeling like you are on a "high".  It should not cause weight gain if only taken for a short time.     Doxycycline '100mg'$  by mouth twice daily until the medicine is completely finished - this medicine is a strong antibiotic that treats certain infections.    Thank you for letting us take care of you today!  Please obtain all of your results from medical records or have your doctors office obtain the results - share them with your doctor - you should be seen at your doctors office in the next 2 days. Call today to arrange your follow up. Take the medications as prescribed. Please review all of the medicines and only take them if you do not have an allergy to them. Please be aware that if you are taking birth control pills, taking other prescriptions, ESPECIALLY ANTIBIOTICS may make the birth control ineffective - if this is the case, either do not engage in sexual activity or use alternative methods of birth control such as condoms until you have finished the medicine and your family doctor says it is OK to restart them. If you are on a blood thinner such as COUMADIN, be aware that any other medicine that you take may cause the coumadin to either work too much, or not  enough - you should have your coumadin level rechecked in next 7 days if this is the case.  ?  It is also a possibility that you have an allergic reaction to any of the medicines that you have been prescribed - Everybody reacts differently to medications and while MOST people have no trouble with most medicines, you may have a reaction such as nausea, vomiting, rash, swelling, shortness of breath. If this is the case, please stop taking the medicine immediately and contact your physician.   If you were given a medication in the ED such as percocet, vicodin, or morphine, be aware that these medicines are sedating and may change your ability to take care of yourself adequately for several hours after being given this medicines - you should not drive or take care of small children if you were given this medicine in the Emergency Department or if you have been prescribed these types of medicines. ?   You should return to the ER IMMEDIATELY if you develop severe or worsening symptoms.

## 2021-03-29 NOTE — ED Provider Notes (Signed)
[facility] EMERGENCY DEPARTMENT Provider Note   CSN: [id number] Arrival date & time: 03/29/21  1206     History Chief Complaint  Patient presents with   Shortness of Breath    Bob Ross is a 55 y.o. male.   Shortness of Breath  This patient is a 55 year old male with a known history of COPD, he has reduced the amount of tobacco that he smokes but he does still smoke.  He has a history of hypertension.  He states that about 10 days ago he tested positive for COVID after being exposed to a coworker.  Since that time he did see his family doctor who gave him a Z-Pak, he did not get any better, he then started to improve a little bit but over the last couple of days has worsened with increasing cough and chest congestion.  He had fever at the beginning of the illness but this seems to have gone away, he has had some progressive generalized fatigue.  He does have some pain in his chest when he takes a deep breath or when he coughs.  He has not had any specific medications for this prior to arrival, he is using his daily bronchodilator therapy with some intermittent relief.  He denies nausea vomiting or diarrhea.  He was sent here by the urgent care where he went this morning, it was reported that he had an oxygen around 90%  Past Medical History:  Diagnosis Date   Cancer ([place])    colon, liver, lymph node   COPD (chronic obstructive pulmonary disease) ([place])    Hypertension     There are no problems to display for this patient.   Past Surgical History:  Procedure Laterality Date   COLON SURGERY     COLOSTOMY     COLOSTOMY REVERSAL     HERNIA REPAIR     LIVER RESECTION         No family history on file.  Social History   Tobacco Use   Smoking status: Every Day    Packs/day: 2.00    Types: Cigarettes   Smokeless tobacco: Never  Substance Use Topics   Alcohol use: Yes    Comment: occasional   Drug use: No    Home Medications Prior to Admission medications    Medication Sig Start Date End Date Taking? Authorizing Provider  albuterol (VENTOLIN HFA) 108 (90 Base) MCG/ACT inhaler Inhale 2 puffs into the lungs every 4 (four) hours as needed for wheezing or shortness of breath. 03/29/21  Yes Noemi Chapel, MD  benzonatate (TESSALON) 100 MG capsule Take 1 capsule (100 mg total) by mouth every 8 (eight) hours. 03/29/21  Yes Noemi Chapel, MD  doxycycline (VIBRAMYCIN) 100 MG capsule Take 1 capsule (100 mg total) by mouth 2 (two) times daily. 03/29/21  Yes Noemi Chapel, MD  predniSONE (DELTASONE) 20 MG tablet Take 2 tablets (40 mg total) by mouth daily. 03/29/21  Yes Noemi Chapel, MD  albuterol (PROVENTIL) (2.5 MG/3ML) 0.083% nebulizer solution Take 3 mLs (2.5 mg total) by nebulization every 6 (six) hours as needed for wheezing or shortness of breath. 10/03/17   Triplett, Tammy, PA-C  amoxicillin-clavulanate (AUGMENTIN) 875-125 MG tablet Take 1 tablet by mouth every 12 (twelve) hours. 10/03/17   Triplett, Tammy, PA-C  aspirin EC 81 MG tablet Take 81 mg by mouth daily.    [provider]  atorvastatin (LIPITOR) 80 MG tablet Take 80 mg by mouth daily at 6 PM.  09/28/17   [provider]  azithromycin (ZITHROMAX) 250 MG tablet Take first 2 tablets together, then 1 every day until finished. 10/03/17   Triplett, Tammy, PA-C  chlorpheniramine-HYDROcodone (TUSSIONEX PENNKINETIC ER) 10-8 MG/5ML SUER Take 5 mLs by mouth at bedtime as needed for cough. 10/09/17   Lorin Glass, PA-C    Allergies    Patient has no known allergies.  Review of Systems   Review of Systems  Respiratory:  Positive for shortness of breath.   All other systems reviewed and are negative.  Physical Exam Updated Vital Signs BP 104/72 (BP Location: Right Arm)   Pulse 84   Temp 98.3 F (36.8 C) (Oral)   Resp 18   SpO2 97%   Physical Exam Vitals and nursing note reviewed.  Constitutional:      General: He is not in acute distress.    Appearance: He is well-developed.  HENT:      Head: Normocephalic and atraumatic.     Mouth/Throat:     Pharynx: No oropharyngeal exudate.  Eyes:     General: No scleral icterus.       Right eye: No discharge.        Left eye: No discharge.     Conjunctiva/sclera: Conjunctivae normal.     Pupils: Pupils are equal, round, and reactive to light.  Neck:     Thyroid: No thyromegaly.     Vascular: No JVD.  Cardiovascular:     Rate and Rhythm: Normal rate and regular rhythm.     Heart sounds: Normal heart sounds. No murmur heard.   No friction rub. No gallop.  Pulmonary:     Effort: Pulmonary effort is normal. No respiratory distress.     Breath sounds: No wheezing or rales.     Comments: No increased work of breathing or accessory muscle use, no rales or rhonchi, he has diminished breath sounds bilaterally but is able to speak in full sentences Abdominal:     General: Bowel sounds are normal. There is no distension.     Palpations: Abdomen is soft. There is no mass.     Tenderness: There is no abdominal tenderness.  Musculoskeletal:        General: No tenderness. Normal range of motion.     Cervical back: Normal range of motion and neck supple.  Lymphadenopathy:     Cervical: No cervical adenopathy.  Skin:    General: Skin is warm and dry.     Findings: No erythema or rash.  Neurological:     Mental Status: He is alert.     Coordination: Coordination normal.  Psychiatric:        Behavior: Behavior normal.    ED Results / Procedures / Treatments   Labs (all labs ordered are listed, but only abnormal results are displayed) Labs Reviewed  CBC - Abnormal; Notable for the following components:      Result Value   Hemoglobin 17.3 (*)    HCT 52.2 (*)    Platelets 121 (*)    All other components within normal limits  BASIC METABOLIC PANEL  TROPONIN I (HIGH SENSITIVITY)    EKG EKG Interpretation  Date/Time:  Friday March 29 2021 12:20:52 EDT Ventricular Rate:  94 PR Interval:  148 QRS Duration: 82 QT  Interval:  368 QTC Calculation: 460 R Axis:   48 Text Interpretation: Normal sinus rhythm ST & T wave abnormality, consider inferior ischemia Prolonged QT Abnormal ECG since 10/09/17, rate faster but ST abnormalities are unchanged from prior Confirmed by  Noemi Chapel [phone]) on 03/29/2021 12:23:27 PM  Radiology DG Chest 2 View  Result Date: 03/29/2021 CLINICAL DATA:  Chest pain EXAM: CHEST - 2 VIEW COMPARISON:  10/09/2017 FINDINGS: Heart size is normal. Hyperinflated lungs with chronically coarsened interstitial markings. Extensive linear scarring within the right mid lung and right lung base. Chronic blunting of the right costophrenic angle, likely reflecting scarring. Mild left basilar scarring or atelectasis. No superimposed airspace opacities seen. No large pleural fluid collection. No pneumothorax. IMPRESSION: 1. 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 2. Mild left basilar scarring or atelectasis. Electronically Signed   By: Davina Poke D.O.   On: 03/29/2021 13:23    Procedures Procedures   Medications Ordered in ED Medications  AeroChamber Plus Flo-Vu Medium MISC 1 each (1 each Other Not Given 03/29/21 1257)  predniSONE (DELTASONE) tablet 40 mg (40 mg Oral Given 03/29/21 1255)  doxycycline (VIBRA-TABS) tablet 100 mg (100 mg Oral Given 03/29/21 1255)  albuterol (VENTOLIN HFA) 108 (90 Base) MCG/ACT inhaler 6 puff (6 puffs Inhalation Given 03/29/21 1257)    ED Course  I have reviewed the triage vital signs and the nursing notes.  Pertinent labs & imaging results that were available during my care of the patient were reviewed by me and considered in my medical decision making (see chart for details).  Clinical Course as of 03/29/21 1349  Fri Mar 29, 2021  1346 Xray normal - no infiltrates - trop neg, BMP and CBC normal - stable for d/c with COPD treatment. [BM]    Clinical Course User Index [BM] Noemi Chapel, MD   MDM Rules/Calculators/A&P                           EKG is  relatively unchanged, will go for x-ray to make sure he has not developed a bacterial pneumonia, he is afebrile, heart rate of 81, blood pressure of 107/83 and oxygen is 93% on room air.  He does have diminished lung sounds, will give steroids, albuterol, with his underlying history of COPD and worsening cough we will also give a round of doxycycline.  Evidently he did not get antivirals but is more than 10 days from his positive test and they would not be helpful at this time anyway.  The patient is understanding and agreeable to the plan  Improved with meds Xray neg Stable for d/c Pt agreeable.  Final Clinical Impression(s) / ED Diagnoses Final diagnoses:  COPD exacerbation ([place])    Rx / DC Orders ED Discharge Orders          Ordered    predniSONE (DELTASONE) 20 MG tablet  Daily        03/29/21 1348    albuterol (VENTOLIN HFA) 108 (90 Base) MCG/ACT inhaler  Every 4 hours PRN        03/29/21 1348    doxycycline (VIBRAMYCIN) 100 MG capsule  2 times daily        03/29/21 1348    benzonatate (TESSALON) 100 MG capsule  Every 8 hours        03/29/21 1348             Noemi Chapel, MD 03/29/21 1349

## 2021-08-30 DIAGNOSIS — J449 Chronic obstructive pulmonary disease, unspecified: Secondary | ICD-10-CM | POA: Diagnosis not present

## 2021-08-30 DIAGNOSIS — Z6830 Body mass index (BMI) 30.0-30.9, adult: Secondary | ICD-10-CM | POA: Diagnosis not present

## 2021-08-30 DIAGNOSIS — F17200 Nicotine dependence, unspecified, uncomplicated: Secondary | ICD-10-CM | POA: Diagnosis not present

## 2021-10-15 DIAGNOSIS — R059 Cough, unspecified: Secondary | ICD-10-CM | POA: Diagnosis not present

## 2021-10-15 DIAGNOSIS — J069 Acute upper respiratory infection, unspecified: Secondary | ICD-10-CM | POA: Diagnosis not present

## 2021-11-19 ENCOUNTER — Ambulatory Visit
Admission: RE | Admit: 2021-11-19 | Discharge: 2021-11-19 | Disposition: A | Discharge status: Home / Self Care | Payer: BC Managed Care – PPO | Source: Ambulatory Visit | Attending: Urgent Care | Admitting: Urgent Care

## 2021-11-19 ENCOUNTER — Ambulatory Visit (INDEPENDENT_AMBULATORY_CARE_PROVIDER_SITE_OTHER): Payer: BC Managed Care – PPO

## 2021-11-19 ENCOUNTER — Other Ambulatory Visit: Payer: Self-pay

## 2021-11-19 VITALS — BP 106/71 | HR 83 | Temp 98.1°F | Resp 18

## 2021-11-19 DIAGNOSIS — L03012 Cellulitis of left finger: Secondary | ICD-10-CM | POA: Diagnosis not present

## 2021-11-19 DIAGNOSIS — M19042 Primary osteoarthritis, left hand: Secondary | ICD-10-CM | POA: Diagnosis not present

## 2021-11-19 DIAGNOSIS — M79642 Pain in left hand: Secondary | ICD-10-CM

## 2021-11-19 MED ORDER — ACETAMINOPHEN 325 MG PO TABS: 650.0000 mg | ORAL_TABLET | Freq: Four times a day (QID) | ORAL | 0 refills | Status: DC | PRN

## 2021-11-19 MED ORDER — DOXYCYCLINE HYCLATE 100 MG PO CAPS: 100.0000 mg | ORAL_CAPSULE | Freq: Two times a day (BID) | ORAL | 0 refills | Status: DC

## 2021-11-19 MED ORDER — TRAMADOL HCL 50 MG PO TABS: 50.0000 mg | ORAL_TABLET | Freq: Four times a day (QID) | ORAL | 0 refills | Status: DC | PRN

## 2021-11-19 NOTE — ED Provider Notes (Signed)
?[place] ? ? ?MRN: [id number] DOB: Jun 05, 1966 ? ?Subjective:  ? ?Bob Ross is a 56 y.o. male presenting for 3-day history of acute onset persistent and worsening left hand pain, swelling, redness.  He thought that there was an abscess over the left index finger and popped it with some drainage that came out.  He does a lot of work with his hands and is worried that he has a metallic foreign body there.  Would like an x-ray.  No history of CKD.  He does have high risk for heart event since he is a smoker and has heart disease.  No history of MI.  Does not do NSAIDs but he did take tramadol yesterday from the severe pain he has. ? ?No current facility-administered medications for this encounter. ? ?Current Outpatient Medications:  ?  doxycycline (VIBRAMYCIN) 100 MG capsule, Take 1 capsule (100 mg total) by mouth 2 (two) times daily., Disp: 20 capsule, Rfl: 0 ?  albuterol (PROVENTIL) (2.5 MG/3ML) 0.083% nebulizer solution, Take 3 mLs (2.5 mg total) by nebulization every 6 (six) hours as needed for wheezing or shortness of breath., Disp: 75 mL, Rfl: 0 ?  albuterol (VENTOLIN HFA) 108 (90 Base) MCG/ACT inhaler, Inhale 2 puffs into the lungs every 4 (four) hours as needed for wheezing or shortness of breath., Disp: 1 each, Rfl: 3 ?  aspirin EC 81 MG tablet, Take 81 mg by mouth daily., Disp: , Rfl:  ?  atorvastatin (LIPITOR) 80 MG tablet, Take 80 mg by mouth daily at 6 PM. , Disp: , Rfl: 0 ?  benzonatate (TESSALON) 100 MG capsule, Take 1 capsule (100 mg total) by mouth every 8 (eight) hours., Disp: 21 capsule, Rfl: 0 ?  chlorpheniramine-HYDROcodone (TUSSIONEX PENNKINETIC ER) 10-8 MG/5ML SUER, Take 5 mLs by mouth at bedtime as needed for cough., Disp: 30 mL, Rfl: 0 ?  predniSONE (DELTASONE) 20 MG tablet, Take 2 tablets (40 mg total) by mouth daily., Disp: 10 tablet, Rfl: 0  ? ?No Known Allergies ? ?Past Medical History:  ?Diagnosis Date  ? Cancer [facility])   ? colon, liver, lymph node  ? COPD (chronic  obstructive pulmonary disease) ([place])   ? Hypertension   ?  ? ?Past Surgical History:  ?Procedure Laterality Date  ? COLON SURGERY    ? COLOSTOMY    ? COLOSTOMY REVERSAL    ? HERNIA REPAIR    ? LIVER RESECTION    ? ? ?History reviewed. No pertinent family history. ? ?Social History  ? ?Tobacco Use  ? Smoking status: Every Day  ?  Packs/day: 2.00  ?  Types: Cigarettes  ? Smokeless tobacco: Never  ?Substance Use Topics  ? Alcohol use: Yes  ?  Comment: occasional  ? Drug use: No  ? ? ?ROS ? ? ?Objective:  ? ?Vitals: ?BP 106/71 (BP Location: Right Arm)   Pulse 83   Temp 98.1 ?F (36.7 ?C) (Oral)   Resp 18   SpO2 95%  ? ?Physical Exam ?Constitutional:   ?   General: He is not in acute distress. ?   Appearance: Normal appearance. He is well-developed and normal weight. He is not ill-appearing, toxic-appearing or diaphoretic.  ?HENT:  ?   Head: Normocephalic and atraumatic.  ?   Right Ear: External ear normal.  ?   Left Ear: External ear normal.  ?   Nose: Nose normal.  ?   Mouth/Throat:  ?   Pharynx: Oropharynx is clear.  ?Eyes:  ?   General:  No scleral icterus.    ?   Right eye: No discharge.     ?   Left eye: No discharge.  ?   Extraocular Movements: Extraocular movements intact.  ?Cardiovascular:  ?   Rate and Rhythm: Normal rate.  ?Pulmonary:  ?   Effort: Pulmonary effort is normal.  ?Musculoskeletal:  ?     Hands: ? ?   Cervical back: Normal range of motion.  ?Neurological:  ?   Mental Status: He is alert and oriented to person, place, and time.  ?Psychiatric:     ?   Mood and Affect: Mood normal.     ?   Behavior: Behavior normal.     ?   Thought Content: Thought content normal.     ?   Judgment: Judgment normal.  ? ? ? ? ? ? ? ? ?DG Hand Complete Left ? ?Result Date: 11/19/2021 ?CLINICAL DATA:  LEFT hand pain, redness, and swelling for 2 days, possible insect bite second MCP joint region EXAM: LEFT HAND - COMPLETE 3+ VIEW COMPARISON:  None FINDINGS: Osseous mineralization normal. Joint spaces preserved though mild  degenerative changes are noted at the IP joint thumb. No acute fracture, dislocation, or bone destruction. IMPRESSION: No acute osseous abnormalities. Electronically Signed   By: Lavonia Dana M.D.   On: 11/19/2021 09:33   ? ?Assessment and Plan :  ? ?PDMP not reviewed this encounter. ? ?1. Cellulitis of finger of left hand   ?2. Left hand pain   ? ?Start doxycycline to address cellulitis of the left index finger, hand.  Recommended Tylenol for regular pain control, tramadol for breakthrough pain.  Maintain strict ER precautions.  I did provide patient with information to [facility] in the event that he wants a consultation regarding his left finger, hand infection. Counseled patient on potential for adverse effects with medications prescribed/recommended today, ER and return-to-clinic precautions discussed, patient verbalized understanding. ? ?  ?Jaynee Eagles, PA-C ?11/19/21 8527 ? ?

## 2021-11-19 NOTE — ED Triage Notes (Signed)
Pt presents with swollen left hand from possible insect bite  ?

## 2021-11-19 NOTE — Discharge Instructions (Addendum)
Start taking doxycycline to address the infection of your finger and hand.  Please schedule Tylenol at 500 mg - 650 mg once every 6 hours as needed for aches and pains.  If you still have pain despite taking Tylenol regularly, this is breakthrough pain.  You can use tramadol once every 6 hours for this.  Once your pain is better controlled, switch back to just Tylenol. ? ?You can contact [facility] today to see if they want to schedule follow-up appointment for you in the event that you end up needing an incision and drainage procedure.  The other option is to present to the emergency room if your symptoms worsen significantly. ? ?

## 2022-03-18 ENCOUNTER — Ambulatory Visit: Payer: Self-pay

## 2022-03-18 ENCOUNTER — Telehealth: Payer: Self-pay | Admitting: Orthopedic Surgery

## 2022-03-18 ENCOUNTER — Ambulatory Visit
Admission: EM | Admit: 2022-03-18 | Discharge: 2022-03-18 | Disposition: A | Discharge status: Home / Self Care | Payer: BC Managed Care – PPO | Attending: Family Medicine | Admitting: Family Medicine

## 2022-03-18 ENCOUNTER — Ambulatory Visit (INDEPENDENT_AMBULATORY_CARE_PROVIDER_SITE_OTHER): Payer: BC Managed Care – PPO

## 2022-03-18 DIAGNOSIS — M25561 Pain in right knee: Secondary | ICD-10-CM | POA: Diagnosis not present

## 2022-03-18 DIAGNOSIS — M1711 Unilateral primary osteoarthritis, right knee: Secondary | ICD-10-CM | POA: Diagnosis not present

## 2022-03-18 MED ORDER — DEXAMETHASONE SODIUM PHOSPHATE 10 MG/ML IJ SOLN
10.0000 mg | Freq: Once | INTRAMUSCULAR | Status: AC
  Administered 2022-03-18: 10 mg via INTRAMUSCULAR

## 2022-03-18 MED ORDER — CYCLOBENZAPRINE HCL 10 MG PO TABS: 10.0000 mg | ORAL_TABLET | Freq: Two times a day (BID) | ORAL | 0 refills | Status: DC | PRN

## 2022-03-18 NOTE — Discharge Instructions (Signed)
Follow-up with orthopedics for your knee pain and to further evaluate the abnormal spot on your x-ray today.  This may be nothing serious but does merit further evaluation.

## 2022-03-18 NOTE — ED Triage Notes (Signed)
Pt reports swelling and pain in right knee x 4-5 days. Pain is worse when walking. Ibuprofen and knee brace gives some relief. Pt denies any injury.

## 2022-03-18 NOTE — Telephone Encounter (Signed)
Patient inquiry/call returned per voice message - patient states seen at Urgent Care in [place] for knee pain - aware we will call back when office reopens tomorrow regarding scheduling.

## 2022-03-18 NOTE — ED Provider Notes (Signed)
[facility]    CSN: [id number] Arrival date & time: 03/18/22  1037      History   Chief Complaint Chief Complaint  Patient presents with   Appointment    1100   Knee Pain    HPI Bob Ross is a 56 y.o. male.   Presenting today with 4 to 5-day history of right anterior knee pain, swelling with no known injury.  States the pain is worse with extension of the leg, weightbearing.  Denies redness, warmth, weakness, numbness, tingling, radiation of pain.  Has torn a ligament in this knee in the past but otherwise no known chronic issues with the knee.  Trying ibuprofen and Tylenol, knee brace which provides mild short-term relief.    Past Medical History:  Diagnosis Date   Cancer [facility])    colon, liver, lymph node   COPD (chronic obstructive pulmonary disease) (HCC)    Hypertension     There are no problems to display for this patient.   Past Surgical History:  Procedure Laterality Date   COLON SURGERY     COLOSTOMY     COLOSTOMY REVERSAL     HERNIA REPAIR     LIVER RESECTION         Home Medications    Prior to Admission medications   Medication Sig Start Date End Date Taking? Authorizing Provider  cyclobenzaprine (FLEXERIL) 10 MG tablet Take 1 tablet (10 mg total) by mouth 2 (two) times daily as needed for muscle spasms. Do not drink alcohol or drive while taking this medication.  May cause drowsiness. 03/18/22  Yes Volney American, PA-C  acetaminophen (TYLENOL) 325 MG tablet Take 2 tablets (650 mg total) by mouth every 6 (six) hours as needed. 11/19/21   Jaynee Eagles, PA-C  albuterol (PROVENTIL) (2.5 MG/3ML) 0.083% nebulizer solution Take 3 mLs (2.5 mg total) by nebulization every 6 (six) hours as needed for wheezing or shortness of breath. 10/03/17   Triplett, Tammy, PA-C  albuterol (VENTOLIN HFA) 108 (90 Base) MCG/ACT inhaler Inhale 2 puffs into the lungs every 4 (four) hours as needed for wheezing or shortness of breath. 03/29/21   Noemi Chapel, MD   aspirin EC 81 MG tablet Take 81 mg by mouth daily.    [provider]  atorvastatin (LIPITOR) 80 MG tablet Take 80 mg by mouth daily at 6 PM.  09/28/17   [provider]  benzonatate (TESSALON) 100 MG capsule Take 1 capsule (100 mg total) by mouth every 8 (eight) hours. 03/29/21   Noemi Chapel, MD  chlorpheniramine-HYDROcodone [facility]) 10-8 MG/5ML SUER Take 5 mLs by mouth at bedtime as needed for cough. 10/09/17   Lorin Glass, PA-C  doxycycline (VIBRAMYCIN) 100 MG capsule Take 1 capsule (100 mg total) by mouth 2 (two) times daily. 11/19/21   Jaynee Eagles, PA-C  predniSONE (DELTASONE) 20 MG tablet Take 2 tablets (40 mg total) by mouth daily. 03/29/21   Noemi Chapel, MD  traMADol (ULTRAM) 50 MG tablet Take 1 tablet (50 mg total) by mouth every 6 (six) hours as needed. 11/19/21   Jaynee Eagles, PA-C    Family History History reviewed. No pertinent family history.  Social History Social History   Tobacco Use   Smoking status: Every Day    Packs/day: 2.00    Types: Cigarettes   Smokeless tobacco: Never  Substance Use Topics   Alcohol use: Yes    Comment: occasional   Drug use: No     Allergies  Tiotropium bromide monohydrate   Review of Systems Review of Systems Per HPI  Physical Exam Triage Vital Signs ED Triage Vitals  Enc Vitals Group     BP 03/18/22 1120 125/70     Pulse Rate 03/18/22 1120 70     Resp 03/18/22 1120 18     Temp 03/18/22 1120 98.2 F (36.8 C)     Temp Source 03/18/22 1120 Oral     SpO2 03/18/22 1120 92 %     Weight --      Height --      Head Circumference --      Peak Flow --      Pain Score 03/18/22 1118 6     Pain Loc --      Pain Edu? --      Excl. in [place]? --    No data found.  Updated Vital Signs BP 125/70 (BP Location: Right Arm)   Pulse 70   Temp 98.2 F (36.8 C) (Oral)   Resp 18   SpO2 92%   Visual Acuity Right Eye Distance:   Left Eye Distance:   Bilateral Distance:    Right Eye Near:    Left Eye Near:    Bilateral Near:     Physical Exam Vitals and nursing note reviewed.  Constitutional:      Appearance: Normal appearance.  HENT:     Head: Atraumatic.  Eyes:     Extraocular Movements: Extraocular movements intact.     Conjunctiva/sclera: Conjunctivae normal.  Cardiovascular:     Rate and Rhythm: Normal rate and regular rhythm.  Pulmonary:     Effort: Pulmonary effort is normal.  Musculoskeletal:        General: Swelling and tenderness present. No deformity or signs of injury. Normal range of motion.     Cervical back: Normal range of motion and neck supple.     Comments: Trace anterior knee swelling, no discoloration, warmth.  Minimal tenderness to palpation medial patellar border.  Negative McMurray's and drawer testing.  No joint instability on exam.  Normal range of motion intact  Skin:    General: Skin is warm and dry.  Neurological:     General: No focal deficit present.     Mental Status: He is oriented to person, place, and time.     Comments: Right lower extremity neurovascularly intact  Psychiatric:        Mood and Affect: Mood normal.        Thought Content: Thought content normal.        Judgment: Judgment normal.    UC Treatments / Results  Labs (all labs ordered are listed, but only abnormal results are displayed) Labs Reviewed - No data to display  EKG   Radiology DG Knee Complete 4 Views Right  Result Date: 03/18/2022 CLINICAL DATA:  Swelling and pain. EXAM: RIGHT KNEE - COMPLETE 4+ VIEW COMPARISON:  None available FINDINGS: Minimal medial compartment joint space narrowing and peripheral osteophytosis. Mild patellofemoral joint space narrowing with mild superior and minimal inferior patellar degenerative osteophytes. There is a sclerotic focus within the anterior lateral distal femoral metadiaphysis measuring up to 3 cm in craniocaudal dimension that is nonspecific but compatible with a bone island. No joint effusion.  No acute fracture or  dislocation. IMPRESSION: 1. Mild medial and patellofemoral compartment osteoarthritis. 2. Sclerotic focus within the distal anterior humeral metadiaphysis is nonspecific but compatible with a bone island. In the absence of a known primary malignancy, a sclerotic bone metastasis  would be far less likely. Recommend clinical correlation. Electronically Signed   By: Yvonne Kendall M.D.   On: 03/18/2022 11:46    Procedures Procedures (including critical care time)  Medications Ordered in UC Medications  dexamethasone (DECADRON) injection 10 mg (10 mg Intramuscular Given 03/18/22 1211)    Initial Impression / Assessment and Plan / UC Course  I have reviewed the triage vital signs and the nursing notes.  Pertinent labs & imaging results that were available during my care of the patient were reviewed by me and considered in my medical decision making (see chart for details).     X-ray of the right knee showing osteoarthritic changes, possibly his current symptoms could be related to an arthritis flare.  We will try IM Decadron, Flexeril, rest protocol and discussed follow-up with orthopedics particularly as there is a sclerotic focus seen on x-ray that would merit further evaluation, particular in the setting of his past history of metastatic cancer.  Discussed these findings with patient who is agreeable to scheduling with orthopedics.  These resources were given.  Final Clinical Impressions(s) / UC Diagnoses   Final diagnoses:  Acute pain of right knee     Discharge Instructions      Follow-up with orthopedics for your knee pain and to further evaluate the abnormal spot on your x-ray today.  This may be nothing serious but does merit further evaluation.    ED Prescriptions     Medication Sig Dispense Auth. Provider   cyclobenzaprine (FLEXERIL) 10 MG tablet Take 1 tablet (10 mg total) by mouth 2 (two) times daily as needed for muscle spasms. Do not drink alcohol or drive while taking this  medication.  May cause drowsiness. 10 tablet Volney American, [place]      PDMP not reviewed this encounter.   Volney American, [place] 03/18/22 1346

## 2022-03-19 NOTE — Telephone Encounter (Signed)
Done; patient aware of appointment scheduled.

## 2022-03-25 ENCOUNTER — Ambulatory Visit (INDEPENDENT_AMBULATORY_CARE_PROVIDER_SITE_OTHER): Payer: BC Managed Care – PPO | Admitting: Orthopaedic Surgery

## 2022-03-25 ENCOUNTER — Encounter: Payer: Self-pay | Admitting: Orthopaedic Surgery

## 2022-03-25 VITALS — BP 129/88 | HR 76 | Ht 71.0 in | Wt 223.1 lb

## 2022-03-25 DIAGNOSIS — F17210 Nicotine dependence, cigarettes, uncomplicated: Secondary | ICD-10-CM

## 2022-03-25 DIAGNOSIS — M25561 Pain in right knee: Secondary | ICD-10-CM

## 2022-03-25 MED ORDER — NAPROXEN 500 MG PO TABS: 500.0000 mg | ORAL_TABLET | Freq: Two times a day (BID) | ORAL | 5 refills | Status: DC

## 2022-03-25 NOTE — Progress Notes (Signed)
Subjective:    Patient ID: Bob Ross, male    DOB: [birth year], 56 y.o.   MRN: [id number]  HPI He has pain of the right knee.  He has had some slight swelling and popping for a while but on March 12, 2022 he had a lot of pain medially of the knee, swelling and popping.  It gave way.  He went to urgent care.  X-rays were done which showed: IMPRESSION: 1. Mild medial and patellofemoral compartment osteoarthritis. 2. Sclerotic focus within the distal anterior humeral metadiaphysis is nonspecific but compatible with a bone island. In the absence of a known primary malignancy, a sclerotic bone metastasis would be far less likely. Recommend clinical correlation.  He has continued pain.  He has some giving way and the swelling is less.  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  He has tried heat and ibuprofen which help slightly.  I have independently reviewed and interpreted x-rays of this patient done at another site by another physician or qualified health professional.  I will notify xray department about statement of humeral problem instead of femoral problem.  I have explained X-rays to the patient.  I am concerned about medial meniscus tear by his history and the giving way.  He is 20 years post colon cancer and chemotherapy.  He has no problem now.    Review of Systems  Constitutional:  Positive for activity change.  Musculoskeletal:  Positive for arthralgias, gait problem and joint swelling.  All other systems reviewed and are negative. For Review of Systems, all other systems reviewed and are negative.  The following is a summary of the past history medically, past history surgically, known current medicines, social history and family history.  This information is gathered electronically by the computer from prior information and documentation.  I review this each visit and have found including this information at this point in the chart is beneficial and informative.   Past  Medical History:  Diagnosis Date   Cancer ([place])    colon, liver, lymph node   COPD (chronic obstructive pulmonary disease) (HCC)    Hypertension     Past Surgical History:  Procedure Laterality Date   COLON SURGERY     COLOSTOMY     COLOSTOMY REVERSAL     HERNIA REPAIR     LIVER RESECTION      Current Outpatient Medications on File Prior to Visit  Medication Sig Dispense Refill   acetaminophen (TYLENOL) 325 MG tablet Take 2 tablets (650 mg total) by mouth every 6 (six) hours as needed. 30 tablet 0   albuterol (PROVENTIL) (2.5 MG/3ML) 0.083% nebulizer solution Take 3 mLs (2.5 mg total) by nebulization every 6 (six) hours as needed for wheezing or shortness of breath. 75 mL 0   albuterol (VENTOLIN HFA) 108 (90 Base) MCG/ACT inhaler Inhale 2 puffs into the lungs every 4 (four) hours as needed for wheezing or shortness of breath. 1 each 3   aspirin EC 81 MG tablet Take 81 mg by mouth daily.     atorvastatin (LIPITOR) 80 MG tablet Take 80 mg by mouth daily at 6 PM.   0   benzonatate (TESSALON) 100 MG capsule Take 1 capsule (100 mg total) by mouth every 8 (eight) hours. 21 capsule 0   chlorpheniramine-HYDROcodone (TUSSIONEX PENNKINETIC ER) 10-8 MG/5ML SUER Take 5 mLs by mouth at bedtime as needed for cough. 30 mL 0   doxycycline (VIBRAMYCIN) 100 MG capsule Take 1 capsule (100 mg total) by  mouth 2 (two) times daily. 20 capsule 0   traMADol (ULTRAM) 50 MG tablet Take 1 tablet (50 mg total) by mouth every 6 (six) hours as needed. 15 tablet 0   cyclobenzaprine (FLEXERIL) 10 MG tablet Take 1 tablet (10 mg total) by mouth 2 (two) times daily as needed for muscle spasms. Do not drink alcohol or drive while taking this medication.  May cause drowsiness. (Patient not taking: Reported on 03/25/2022) 10 tablet 0   predniSONE (DELTASONE) 20 MG tablet Take 2 tablets (40 mg total) by mouth daily. (Patient not taking: Reported on 03/25/2022) 10 tablet 0   No current facility-administered medications on file  prior to visit.    Social History   Socioeconomic History   Marital status: Married    Spouse name: Not on file   Number of children: Not on file   Years of education: Not on file   Highest education level: Not on file  Occupational History   Not on file  Tobacco Use   Smoking status: Every Day    Packs/day: 2.00    Types: Cigarettes   Smokeless tobacco: Never  Substance and Sexual Activity   Alcohol use: Yes    Comment: occasional   Drug use: No   Sexual activity: Not on file  Other Topics Concern   Not on file  Social History Narrative   Not on file   Social Determinants of Health   Financial Resource Strain: Not on file  Food Insecurity: Not on file  Transportation Needs: Not on file  Physical Activity: Not on file  Stress: Not on file  Social Connections: Not on file  Intimate Partner Violence: Not on file    History reviewed. No pertinent family history.  BP 129/88   Pulse 76   Ht '5\' 11"'$  (1.803 m)   Wt 223 lb 2 oz (101.2 kg)   BMI 31.12 kg/m   Body mass index is 31.12 kg/m.      Objective:   Physical Exam Vitals and nursing note reviewed. Exam conducted with a chaperone present.  Constitutional:      Appearance: He is well-developed.  HENT:     Head: Normocephalic and atraumatic.  Eyes:     Conjunctiva/sclera: Conjunctivae normal.     Pupils: Pupils are equal, round, and reactive to light.  Cardiovascular:     Rate and Rhythm: Normal rate and regular rhythm.  Pulmonary:     Effort: Pulmonary effort is normal.  Abdominal:     Palpations: Abdomen is soft.  Musculoskeletal:     Cervical back: Normal range of motion and neck supple.       Legs:  Skin:    General: Skin is warm and dry.  Neurological:     Mental Status: He is alert and oriented to person, place, and time.     Cranial Nerves: No cranial nerve deficit.     Motor: No abnormal muscle tone.     Coordination: Coordination normal.     Deep Tendon Reflexes: Reflexes are normal  and symmetric. Reflexes normal.  Psychiatric:        Behavior: Behavior normal.        Thought Content: Thought content normal.        Judgment: Judgment normal.           Assessment & Plan:   Encounter Diagnoses  Name Primary?   Acute pain of right knee Yes   Nicotine dependence, cigarettes, uncomplicated    PROCEDURE NOTE:  The  patient requests injections of the right knee , verbal consent was obtained.  The right knee was prepped appropriately after time out was performed.   Sterile technique was observed and injection of 1 cc of DepoMedrol '40mg'$  with several cc's of plain xylocaine. Anesthesia was provided by ethyl chloride and a 20-gauge needle was used to inject the knee area. The injection was tolerated well.  A band aid dressing was applied.  The patient was advised to apply ice later today and tomorrow to the injection sight as needed.  I have told him I am concerned about meniscus tear medially of the right knee.  I will begin Naprosyn 500 po bid pc.  Return in two weeks.  He may need MRI then.  Call if any problem.  Precautions discussed.  Electronically Signed Sanjuana Kava, MD 8/1/20238:23 AM

## 2022-03-25 NOTE — Patient Instructions (Signed)
Smoking Tobacco Information, Adult Smoking tobacco can be harmful to your health. Tobacco contains a toxic colorless chemical called nicotine. Nicotine causes changes in your brain that make you want more and more. This is called addiction. This can make it hard to stop smoking once you start. Tobacco also has other toxic chemicals that can hurt your body and raise your risk of many cancers. Menthol or "lite" tobacco or cigarette brands are not safer than regular brands. How can smoking tobacco affect me? Smoking tobacco puts you at risk for: Cancer. Smoking is most commonly associated with lung cancer, but can also lead to cancer in other parts of the body. Chronic obstructive pulmonary disease (COPD). This is a long-term lung condition that makes it hard to breathe. It also gets worse over time. High blood pressure (hypertension), heart disease, stroke, heart attack, and lung infections, such as pneumonia. Cataracts. This is when the lenses in the eyes become clouded. Digestive problems. This may include peptic ulcers, heartburn, and gastroesophageal reflux disease (GERD). Oral health problems, such as gum disease, mouth sores, and tooth loss. Loss of taste and smell. Smoking also affects how you look and smell. Smoking may cause: Wrinkles. Yellow or stained teeth, fingers, and fingernails. Bad breath. Bad-smelling clothes and hair. Smoking tobacco can also affect your social life, because: It may be challenging to find places to smoke when away from home. Many workplaces, restaurants, hotels, and public places are tobacco-free. Smoking is expensive. This is due to the cost of tobacco and the long-term costs of treating health problems from smoking. Secondhand smoke may affect those around you. Secondhand smoke can cause lung cancer, breathing problems, and heart disease. Children of smokers have a higher risk for: Sudden infant death syndrome (SIDS). Ear infections. Lung infections. What  actions can I take to prevent health problems? Quit smoking  Do not start smoking. Quit if you already smoke. Do not replace cigarette smoking with vaping devices, such as e-cigarettes. Make a plan to quit smoking and commit to it. Look for programs to help you, and ask your health care provider for recommendations and ideas. Set a date and write down all the reasons you want to quit. Let your friends and family know you are quitting so they can help and support you. Consider finding friends who also want to quit. It can be easier to quit with someone else, so that you can support each other. Talk with your health care provider about using nicotine replacement medicines to help you quit. These include gum, lozenges, patches, sprays, or pills. If you try to quit but return to smoking, stay positive. It is common to slip up when you first quit, so take it one day at a time. Be prepared for cravings. When you feel the urge to smoke, chew gum or suck on hard candy. Lifestyle Stay busy. Take care of your body. Get plenty of exercise, eat a healthy diet, and drink plenty of water. Find ways to manage your stress, such as meditation, yoga, exercise, or time spent with friends and family. Ask your health care provider about having regular tests (screenings) to check for cancer. This may include blood tests, imaging tests, and other tests. Where to find support To get support to quit smoking, consider: Asking your health care provider for more information and resources. Joining a support group for people who want to quit smoking in your local community. There are many effective programs that may help you to quit. Calling the smokefree.gov counselor   helpline at 1-800-QUIT-NOW (1-800-784-8669). Where to find more information You may find more information about quitting smoking from: Centers for Disease Control and Prevention: cdc.gov/tobacco Smokefree.gov: smokefree.gov American Lung Association:  freedomfromsmoking.org Contact a health care provider if: You have problems breathing. Your lips, nose, or fingers turn blue. You have chest pain. You are coughing up blood. You feel like you will faint. You have other health changes that cause you to worry. Summary Smoking tobacco can negatively affect your health, the health of those around you, your finances, and your social life. Do not start smoking. Quit if you already smoke. If you need help quitting, ask your health care provider. Consider joining a support group for people in your local community who want to quit smoking. There are many effective programs that may help you to quit. This information is not intended to replace advice given to you by your health care provider. Make sure you discuss any questions you have with your health care provider. Document Revised: 08/06/2021 Document Reviewed: 08/06/2021 Elsevier Patient Education  2023 [employer]  

## 2022-04-02 DIAGNOSIS — H6090 Unspecified otitis externa, unspecified ear: Secondary | ICD-10-CM | POA: Diagnosis not present

## 2022-04-02 DIAGNOSIS — H9203 Otalgia, bilateral: Secondary | ICD-10-CM | POA: Diagnosis not present

## 2022-04-02 DIAGNOSIS — R070 Pain in throat: Secondary | ICD-10-CM | POA: Diagnosis not present

## 2022-04-08 ENCOUNTER — Encounter: Payer: Self-pay | Admitting: Orthopaedic Surgery

## 2022-04-08 ENCOUNTER — Ambulatory Visit: Payer: BC Managed Care – PPO | Admitting: Orthopaedic Surgery

## 2022-04-08 DIAGNOSIS — M25561 Pain in right knee: Secondary | ICD-10-CM

## 2022-04-08 DIAGNOSIS — F17210 Nicotine dependence, cigarettes, uncomplicated: Secondary | ICD-10-CM

## 2022-04-08 DIAGNOSIS — Z720 Tobacco use: Secondary | ICD-10-CM | POA: Insufficient documentation

## 2022-04-08 DIAGNOSIS — H6090 Unspecified otitis externa, unspecified ear: Secondary | ICD-10-CM | POA: Insufficient documentation

## 2022-04-08 NOTE — Patient Instructions (Signed)
Steps to Quit Smoking Smoking tobacco is the leading cause of preventable death. It can affect almost every organ in the body. Smoking puts you and people around you at risk for many serious, long-lasting (chronic) diseases. Quitting smoking can be hard, but it is one of the best things that you can do for your health. It is never too late to quit. Do not give up if you cannot quit the first time. Some people need to try many times to quit. Do your best to stick to your quit plan, and talk with your doctor if you have any questions or concerns. How do I get ready to quit? Pick a date to quit. Set a date within the next 2 weeks to give you time to prepare. Write down the reasons why you are quitting. Keep this list in places where you will see it often. Tell your family, friends, and co-workers that you are quitting. Their support is important. Talk with your doctor about the choices that may help you quit. Find out if your health insurance will pay for these treatments. Know the people, places, things, and activities that make you want to smoke (triggers). Avoid them. What first steps can I take to quit smoking? Throw away all cigarettes at home, at work, and in your car. Throw away the things that you use when you smoke, such as ashtrays and lighters. Clean your car. Empty the ashtray. Clean your home, including curtains and carpets. What can I do to help me quit smoking? Talk with your doctor about taking medicines and seeing a counselor. You are more likely to succeed when you do both. If you are pregnant or breastfeeding: Talk with your doctor about counseling or other ways to quit smoking. Do not take medicine to help you quit smoking unless your doctor tells you to. Quit right away Quit smoking completely, instead of slowly cutting back on how much you smoke over a period of time. Stopping smoking right away may be more successful than slowly quitting. Go to counseling. In-person is best  if this is an option. You are more likely to quit if you go to counseling sessions regularly. Take medicine You may take medicines to help you quit. Some medicines need a prescription, and some you can buy over-the-counter. Some medicines may contain a drug called nicotine to replace the nicotine in cigarettes. Medicines may: Help you stop having the desire to smoke (cravings). Help to stop the problems that come when you stop smoking (withdrawal symptoms). Your doctor may ask you to use: Nicotine patches, gum, or lozenges. Nicotine inhalers or sprays. Non-nicotine medicine that you take by mouth. Find resources Find resources and other ways to help you quit smoking and remain smoke-free after you quit. They include: Online chats with a counselor. Phone quitlines. Printed self-help materials. Support groups or group counseling. Text messaging programs. Mobile phone apps. Use apps on your mobile phone or tablet that can help you stick to your quit plan. Examples of free services include Quit Guide from the CDC and smokefree.gov  What can I do to make it easier to quit?  Talk to your family and friends. Ask them to support and encourage you. Call a phone quitline, such as 1-800-QUIT-NOW, reach out to support groups, or work with a counselor. Ask people who smoke to not smoke around you. Avoid places that make you want to smoke, such as: Bars. Parties. Smoke-break areas at work. Spend time with people who do not smoke. Lower   the stress in your life. Stress can make you want to smoke. Try these things to lower stress: Getting regular exercise. Doing deep-breathing exercises. Doing yoga. Meditating. What benefits will I see if I quit smoking? Over time, you may have: A better sense of smell and taste. Less coughing and sore throat. A slower heart rate. Lower blood pressure. Clearer skin. Better breathing. Fewer sick days. Summary Quitting smoking can be hard, but it is one of  the best things that you can do for your health. Do not give up if you cannot quit the first time. Some people need to try many times to quit. When you decide to quit smoking, make a plan to help you succeed. Quit smoking right away, not slowly over a period of time. When you start quitting, get help and support to keep you smoke-free. This information is not intended to replace advice given to you by your health care provider. Make sure you discuss any questions you have with your health care provider. Document Revised: 08/02/2021 Document Reviewed: 08/02/2021 Elsevier Patient Education  2023 Elsevier Inc.  

## 2022-04-08 NOTE — Progress Notes (Signed)
I am better.  The injection last time really helped him.  He has no swelling now of the knee on the right.  He has no giving way.  He has been taking the Naprosyn with no side effects.  He is pleased with his progress.  Right knee has no effusion, has crepitus, ROM 0 to 115, no limp, stable, NV intact, no distal edema.  Encounter Diagnoses  Name Primary?   Acute pain of right knee Yes   Nicotine dependence, cigarettes, uncomplicated    I have told him he still could have a meniscus tear.  To just observe it.  I will see him in one month.  Call if any problem.  Precautions discussed.  Continue the Naprosyn.  Electronically Signed Sanjuana Kava, MD 8/15/20238:27 AM

## 2022-04-09 ENCOUNTER — Emergency Department (HOSPITAL_COMMUNITY): Payer: BC Managed Care – PPO

## 2022-04-09 ENCOUNTER — Encounter (HOSPITAL_COMMUNITY)
Admission: EM | Disposition: A | Discharge status: Home / Self Care | Payer: Self-pay | Source: Home / Self Care | Attending: Cardiovascular Disease

## 2022-04-09 ENCOUNTER — Other Ambulatory Visit: Payer: Self-pay

## 2022-04-09 ENCOUNTER — Encounter (HOSPITAL_COMMUNITY): Payer: Self-pay | Admitting: Emergency Medicine

## 2022-04-09 ENCOUNTER — Inpatient Hospital Stay (HOSPITAL_COMMUNITY)
Admission: EM | Admit: 2022-04-09 | Discharge: 2022-04-18 | DRG: 266 | Disposition: A | Discharge status: Home / Self Care | Payer: BC Managed Care – PPO | Attending: Cardiovascular Disease | Admitting: Cardiovascular Disease

## 2022-04-09 DIAGNOSIS — I214 Non-ST elevation (NSTEMI) myocardial infarction: Secondary | ICD-10-CM

## 2022-04-09 DIAGNOSIS — I723 Aneurysm of iliac artery: Secondary | ICD-10-CM | POA: Diagnosis not present

## 2022-04-09 DIAGNOSIS — F17200 Nicotine dependence, unspecified, uncomplicated: Secondary | ICD-10-CM | POA: Diagnosis not present

## 2022-04-09 DIAGNOSIS — R7303 Prediabetes: Secondary | ICD-10-CM | POA: Diagnosis present

## 2022-04-09 DIAGNOSIS — I7143 Infrarenal abdominal aortic aneurysm, without rupture: Secondary | ICD-10-CM | POA: Diagnosis present

## 2022-04-09 DIAGNOSIS — I25110 Atherosclerotic heart disease of native coronary artery with unstable angina pectoris: Secondary | ICD-10-CM | POA: Diagnosis not present

## 2022-04-09 DIAGNOSIS — K045 Chronic apical periodontitis: Secondary | ICD-10-CM | POA: Diagnosis present

## 2022-04-09 DIAGNOSIS — Z01818 Encounter for other preprocedural examination: Secondary | ICD-10-CM

## 2022-04-09 DIAGNOSIS — D72829 Elevated white blood cell count, unspecified: Secondary | ICD-10-CM | POA: Diagnosis present

## 2022-04-09 DIAGNOSIS — E782 Mixed hyperlipidemia: Secondary | ICD-10-CM | POA: Diagnosis present

## 2022-04-09 DIAGNOSIS — Z720 Tobacco use: Secondary | ICD-10-CM | POA: Diagnosis present

## 2022-04-09 DIAGNOSIS — D696 Thrombocytopenia, unspecified: Secondary | ICD-10-CM | POA: Diagnosis not present

## 2022-04-09 DIAGNOSIS — Z9049 Acquired absence of other specified parts of digestive tract: Secondary | ICD-10-CM

## 2022-04-09 DIAGNOSIS — I10 Essential (primary) hypertension: Secondary | ICD-10-CM | POA: Diagnosis not present

## 2022-04-09 DIAGNOSIS — J449 Chronic obstructive pulmonary disease, unspecified: Secondary | ICD-10-CM | POA: Diagnosis not present

## 2022-04-09 DIAGNOSIS — Z888 Allergy status to other drugs, medicaments and biological substances status: Secondary | ICD-10-CM

## 2022-04-09 DIAGNOSIS — I110 Hypertensive heart disease with heart failure: Secondary | ICD-10-CM | POA: Diagnosis present

## 2022-04-09 DIAGNOSIS — J439 Emphysema, unspecified: Secondary | ICD-10-CM | POA: Diagnosis not present

## 2022-04-09 DIAGNOSIS — Z79899 Other long term (current) drug therapy: Secondary | ICD-10-CM

## 2022-04-09 DIAGNOSIS — C787 Secondary malignant neoplasm of liver and intrahepatic bile duct: Secondary | ICD-10-CM | POA: Diagnosis present

## 2022-04-09 DIAGNOSIS — I428 Other cardiomyopathies: Secondary | ICD-10-CM | POA: Diagnosis present

## 2022-04-09 DIAGNOSIS — Z006 Encounter for examination for normal comparison and control in clinical research program: Secondary | ICD-10-CM | POA: Diagnosis not present

## 2022-04-09 DIAGNOSIS — Z20822 Contact with and (suspected) exposure to covid-19: Secondary | ICD-10-CM | POA: Diagnosis not present

## 2022-04-09 DIAGNOSIS — L538 Other specified erythematous conditions: Secondary | ICD-10-CM | POA: Diagnosis present

## 2022-04-09 DIAGNOSIS — K0530 Chronic periodontitis, unspecified: Secondary | ICD-10-CM

## 2022-04-09 DIAGNOSIS — Q231 Congenital insufficiency of aortic valve: Secondary | ICD-10-CM | POA: Diagnosis not present

## 2022-04-09 DIAGNOSIS — I350 Nonrheumatic aortic (valve) stenosis: Secondary | ICD-10-CM

## 2022-04-09 DIAGNOSIS — K029 Dental caries, unspecified: Secondary | ICD-10-CM | POA: Diagnosis not present

## 2022-04-09 DIAGNOSIS — I5043 Acute on chronic combined systolic (congestive) and diastolic (congestive) heart failure: Secondary | ICD-10-CM | POA: Diagnosis not present

## 2022-04-09 DIAGNOSIS — I708 Atherosclerosis of other arteries: Secondary | ICD-10-CM | POA: Diagnosis present

## 2022-04-09 DIAGNOSIS — Z9221 Personal history of antineoplastic chemotherapy: Secondary | ICD-10-CM

## 2022-04-09 DIAGNOSIS — E669 Obesity, unspecified: Secondary | ICD-10-CM | POA: Diagnosis present

## 2022-04-09 DIAGNOSIS — R079 Chest pain, unspecified: Secondary | ICD-10-CM | POA: Diagnosis not present

## 2022-04-09 DIAGNOSIS — K06020 Generalized gingival recession, unspecified: Secondary | ICD-10-CM | POA: Diagnosis present

## 2022-04-09 DIAGNOSIS — Z6830 Body mass index (BMI) 30.0-30.9, adult: Secondary | ICD-10-CM

## 2022-04-09 DIAGNOSIS — Z952 Presence of prosthetic heart valve: Secondary | ICD-10-CM

## 2022-04-09 DIAGNOSIS — K439 Ventral hernia without obstruction or gangrene: Secondary | ICD-10-CM | POA: Diagnosis present

## 2022-04-09 DIAGNOSIS — Z7982 Long term (current) use of aspirin: Secondary | ICD-10-CM

## 2022-04-09 DIAGNOSIS — Z85038 Personal history of other malignant neoplasm of large intestine: Secondary | ICD-10-CM

## 2022-04-09 DIAGNOSIS — K036 Deposits [accretions] on teeth: Secondary | ICD-10-CM | POA: Diagnosis present

## 2022-04-09 DIAGNOSIS — Z7951 Long term (current) use of inhaled steroids: Secondary | ICD-10-CM

## 2022-04-09 DIAGNOSIS — I2510 Atherosclerotic heart disease of native coronary artery without angina pectoris: Secondary | ICD-10-CM | POA: Diagnosis not present

## 2022-04-09 DIAGNOSIS — N2889 Other specified disorders of kidney and ureter: Secondary | ICD-10-CM | POA: Diagnosis present

## 2022-04-09 DIAGNOSIS — F17210 Nicotine dependence, cigarettes, uncomplicated: Secondary | ICD-10-CM | POA: Diagnosis not present

## 2022-04-09 DIAGNOSIS — Z905 Acquired absence of kidney: Secondary | ICD-10-CM

## 2022-04-09 DIAGNOSIS — S35401S Unspecified injury of right renal artery, sequela: Secondary | ICD-10-CM

## 2022-04-09 DIAGNOSIS — Z8701 Personal history of pneumonia (recurrent): Secondary | ICD-10-CM

## 2022-04-09 DIAGNOSIS — Z85528 Personal history of other malignant neoplasm of kidney: Secondary | ICD-10-CM

## 2022-04-09 HISTORY — DX: Personal history of pneumonia (recurrent): Z87.01

## 2022-04-09 HISTORY — DX: Cardiomyopathy, unspecified: I42.9

## 2022-04-09 HISTORY — DX: Male erectile dysfunction, unspecified: N52.9

## 2022-04-09 HISTORY — DX: Essential (primary) hypertension: I10

## 2022-04-09 HISTORY — DX: Mixed hyperlipidemia: E78.2

## 2022-04-09 HISTORY — PX: LEFT HEART CATH AND CORONARY ANGIOGRAPHY: CATH118249

## 2022-04-09 HISTORY — DX: Incisional hernia without obstruction or gangrene: K43.2

## 2022-04-09 HISTORY — DX: Atherosclerotic heart disease of native coronary artery without angina pectoris: I25.10

## 2022-04-09 HISTORY — DX: Nonrheumatic aortic (valve) stenosis: I35.0

## 2022-04-09 HISTORY — DX: Malignant neoplasm of colon, unspecified: C18.9

## 2022-04-09 LAB — BASIC METABOLIC PANEL
Anion gap: 5 (ref 5–15)
BUN: 17 mg/dL (ref 6–20)
CO2: 25 mmol/L (ref 22–32)
Calcium: 9.1 mg/dL (ref 8.9–10.3)
Chloride: 110 mmol/L (ref 98–111)
Creatinine, Ser: 1.12 mg/dL (ref 0.61–1.24)
GFR, Estimated: >60 mL/min (ref >60)
Glucose, Bld: 82 mg/dL (ref 70–99)
Potassium: 4.3 mmol/L (ref 3.5–5.1)
Sodium: 140 mmol/L (ref 135–145)

## 2022-04-09 LAB — CBC
HCT: 49.3 % (ref 39.0–52.0)
Hemoglobin: 16.4 g/dL (ref 13.0–17.0)
MCH: 30.9 pg (ref 26.0–34.0)
MCHC: 33.3 g/dL (ref 30.0–36.0)
MCV: 93 fL (ref 80.0–100.0)
Platelets: 117 10*3/uL — ABNORMAL LOW (ref 150–400)
RBC: 5.3 MIL/uL (ref 4.22–5.81)
RDW: 14.8 % (ref 11.5–15.5)
WBC: 11.4 10*3/uL — ABNORMAL HIGH (ref 4.0–10.5)
nRBC: 0 % (ref 0.0–0.2)

## 2022-04-09 LAB — TROPONIN I (HIGH SENSITIVITY)
Troponin I (High Sensitivity): 489 ng/L (ref <18)
Troponin I (High Sensitivity): 1146 ng/L (ref <18)

## 2022-04-09 SURGERY — LEFT HEART CATH AND CORONARY ANGIOGRAPHY
Anesthesia: LOCAL

## 2022-04-09 MED ORDER — IOHEXOL 350 MG/ML SOLN
INTRAVENOUS | Status: DC | PRN
  Administered 2022-04-09: 105 mL

## 2022-04-09 MED ORDER — ASPIRIN 81 MG PO CHEW
CHEWABLE_TABLET | ORAL | Status: AC
  Filled 2022-04-09: qty 4

## 2022-04-09 MED ORDER — TRAMADOL HCL 50 MG PO TABS: 50.0000 mg | ORAL_TABLET | Freq: Four times a day (QID) | ORAL | Status: DC | PRN

## 2022-04-09 MED ORDER — NITROGLYCERIN IN D5W 200-5 MCG/ML-% IV SOLN
5.0000 ug/min | INTRAVENOUS | Status: DC
  Administered 2022-04-09: 5 ug/min via INTRAVENOUS
  Filled 2022-04-09: qty 250

## 2022-04-09 MED ORDER — ACETAMINOPHEN 325 MG PO TABS
650.0000 mg | ORAL_TABLET | ORAL | Status: DC | PRN
  Filled 2022-04-09: qty 2

## 2022-04-09 MED ORDER — HEPARIN (PORCINE) IN NACL 1000-0.9 UT/500ML-% IV SOLN
INTRAVENOUS | Status: AC
  Filled 2022-04-09: qty 500

## 2022-04-09 MED ORDER — LIDOCAINE HCL (PF) 1 % IJ SOLN
INTRAMUSCULAR | Status: DC | PRN
  Administered 2022-04-09: 2 mL

## 2022-04-09 MED ORDER — MIDAZOLAM HCL 2 MG/2ML IJ SOLN
INTRAMUSCULAR | Status: AC
  Filled 2022-04-09: qty 2

## 2022-04-09 MED ORDER — ACETAMINOPHEN 500 MG PO TABS
1000.0000 mg | ORAL_TABLET | Freq: Once | ORAL | Status: AC
Start: 2022-04-09 — End: 2022-04-09
  Administered 2022-04-09: 1000 mg via ORAL
  Filled 2022-04-09: qty 2

## 2022-04-09 MED ORDER — ACETAMINOPHEN 325 MG PO TABS
650.0000 mg | ORAL_TABLET | ORAL | Status: DC | PRN
  Administered 2022-04-11 – 2022-04-14 (×3): 650 mg via ORAL
  Filled 2022-04-09 (×2): qty 2

## 2022-04-09 MED ORDER — HEPARIN (PORCINE) 25000 UT/250ML-% IV SOLN
1200.0000 [IU]/h | INTRAVENOUS | Status: DC
  Administered 2022-04-09: 1200 [IU]/h via INTRAVENOUS
  Filled 2022-04-09: qty 250

## 2022-04-09 MED ORDER — ATORVASTATIN CALCIUM 80 MG PO TABS
80.0000 mg | ORAL_TABLET | Freq: Every day | ORAL | Status: DC
  Administered 2022-04-10: 80 mg via ORAL
  Filled 2022-04-09: qty 1

## 2022-04-09 MED ORDER — LABETALOL HCL 5 MG/ML IV SOLN: 10.0000 mg | INTRAVENOUS | Status: AC | PRN

## 2022-04-09 MED ORDER — ONDANSETRON HCL 4 MG/2ML IJ SOLN: 4.0000 mg | Freq: Four times a day (QID) | INTRAMUSCULAR | Status: DC | PRN

## 2022-04-09 MED ORDER — MIDAZOLAM HCL 2 MG/2ML IJ SOLN
INTRAMUSCULAR | Status: DC | PRN
  Administered 2022-04-09: 1 mg via INTRAVENOUS

## 2022-04-09 MED ORDER — SODIUM CHLORIDE 0.9% FLUSH
3.0000 mL | Freq: Two times a day (BID) | INTRAVENOUS | Status: DC
  Administered 2022-04-09 – 2022-04-17 (×15): 3 mL via INTRAVENOUS

## 2022-04-09 MED ORDER — NITROGLYCERIN 0.4 MG SL SUBL
0.4000 mg | SUBLINGUAL_TABLET | SUBLINGUAL | Status: DC | PRN
  Administered 2022-04-09: 0.4 mg via SUBLINGUAL
  Filled 2022-04-09: qty 1

## 2022-04-09 MED ORDER — HEPARIN SODIUM (PORCINE) 1000 UNIT/ML IJ SOLN
INTRAMUSCULAR | Status: DC | PRN
  Administered 2022-04-09: 5000 [IU] via INTRAVENOUS

## 2022-04-09 MED ORDER — HEPARIN BOLUS VIA INFUSION
4000.0000 [IU] | Freq: Once | INTRAVENOUS | Status: AC
  Administered 2022-04-09: 4000 [IU] via INTRAVENOUS

## 2022-04-09 MED ORDER — SODIUM CHLORIDE 0.9 % IV SOLN: 250.0000 mL | INTRAVENOUS | Status: DC | PRN

## 2022-04-09 MED ORDER — VERAPAMIL HCL 2.5 MG/ML IV SOLN
INTRAVENOUS | Status: AC
  Filled 2022-04-09: qty 2

## 2022-04-09 MED ORDER — MORPHINE SULFATE (PF) 4 MG/ML IV SOLN
4.0000 mg | Freq: Once | INTRAVENOUS | Status: AC
  Administered 2022-04-09: 4 mg via INTRAVENOUS
  Filled 2022-04-09: qty 1

## 2022-04-09 MED ORDER — ONDANSETRON HCL 4 MG/2ML IJ SOLN
4.0000 mg | Freq: Once | INTRAMUSCULAR | Status: AC
Start: 2022-04-09 — End: 2022-04-09
  Administered 2022-04-09: 4 mg via INTRAVENOUS
  Filled 2022-04-09: qty 2

## 2022-04-09 MED ORDER — ALBUTEROL SULFATE (2.5 MG/3ML) 0.083% IN NEBU
2.5000 mg | INHALATION_SOLUTION | Freq: Four times a day (QID) | RESPIRATORY_TRACT | Status: DC | PRN
  Administered 2022-04-14: 2.5 mg via RESPIRATORY_TRACT

## 2022-04-09 MED ORDER — ASPIRIN 81 MG PO CHEW
CHEWABLE_TABLET | ORAL | Status: DC | PRN
  Administered 2022-04-09: 324 mg via ORAL

## 2022-04-09 MED ORDER — ONDANSETRON HCL 4 MG/2ML IJ SOLN
4.0000 mg | Freq: Four times a day (QID) | INTRAMUSCULAR | Status: DC | PRN
Start: 2022-04-09 — End: 2022-04-17

## 2022-04-09 MED ORDER — HEPARIN SODIUM (PORCINE) 1000 UNIT/ML IJ SOLN
INTRAMUSCULAR | Status: AC
  Filled 2022-04-09: qty 10

## 2022-04-09 MED ORDER — ASPIRIN 81 MG PO TBEC
81.0000 mg | DELAYED_RELEASE_TABLET | Freq: Every day | ORAL | Status: DC
  Administered 2022-04-10 – 2022-04-18 (×8): 81 mg via ORAL
  Filled 2022-04-09 (×8): qty 1

## 2022-04-09 MED ORDER — SODIUM CHLORIDE 0.9% FLUSH
3.0000 mL | INTRAVENOUS | Status: DC | PRN
  Administered 2022-04-13: 3 mL via INTRAVENOUS

## 2022-04-09 MED ORDER — LACTATED RINGERS IV BOLUS
1000.0000 mL | Freq: Once | INTRAVENOUS | Status: AC
  Administered 2022-04-09: 1000 mL via INTRAVENOUS

## 2022-04-09 MED ORDER — VERAPAMIL HCL 2.5 MG/ML IV SOLN
INTRAVENOUS | Status: DC | PRN
  Administered 2022-04-09: 10 mL via INTRA_ARTERIAL

## 2022-04-09 MED ORDER — FENTANYL CITRATE (PF) 100 MCG/2ML IJ SOLN
INTRAMUSCULAR | Status: AC
  Filled 2022-04-09: qty 2

## 2022-04-09 MED ORDER — SODIUM CHLORIDE 0.9 % IV BOLUS
500.0000 mL | Freq: Once | INTRAVENOUS | Status: AC
  Administered 2022-04-09: 500 mL via INTRAVENOUS

## 2022-04-09 MED ORDER — SODIUM CHLORIDE 0.9 % IV SOLN: INTRAVENOUS | Status: AC

## 2022-04-09 MED ORDER — ASPIRIN 325 MG PO TABS: 325.0000 mg | ORAL_TABLET | Freq: Every day | ORAL | Status: DC

## 2022-04-09 MED ORDER — LIDOCAINE HCL (PF) 1 % IJ SOLN
INTRAMUSCULAR | Status: AC
  Filled 2022-04-09: qty 30

## 2022-04-09 MED ORDER — SODIUM CHLORIDE 0.9 % IV SOLN: INTRAVENOUS | Status: DC

## 2022-04-09 MED ORDER — SODIUM CHLORIDE 0.9% FLUSH
3.0000 mL | Freq: Two times a day (BID) | INTRAVENOUS | Status: DC
  Administered 2022-04-11 – 2022-04-17 (×10): 3 mL via INTRAVENOUS

## 2022-04-09 MED ORDER — FENTANYL CITRATE (PF) 100 MCG/2ML IJ SOLN
INTRAMUSCULAR | Status: DC | PRN
  Administered 2022-04-09: 25 ug via INTRAVENOUS

## 2022-04-09 MED ORDER — SODIUM CHLORIDE 0.9 % IV BOLUS: 500.0000 mL | Freq: Once | INTRAVENOUS | Status: DC

## 2022-04-09 MED ORDER — HYDRALAZINE HCL 20 MG/ML IJ SOLN: 10.0000 mg | INTRAMUSCULAR | Status: AC | PRN

## 2022-04-09 MED ORDER — SODIUM CHLORIDE 0.9% FLUSH: 3.0000 mL | INTRAVENOUS | Status: DC | PRN

## 2022-04-09 MED ORDER — SODIUM CHLORIDE 0.9 % IV SOLN
INTRAVENOUS | Status: AC | PRN
  Administered 2022-04-09: 10 mL/h via INTRAVENOUS

## 2022-04-09 MED ORDER — HEPARIN (PORCINE) IN NACL 1000-0.9 UT/500ML-% IV SOLN
INTRAVENOUS | Status: DC | PRN
  Administered 2022-04-09 (×2): 500 mL

## 2022-04-09 SURGICAL SUPPLY — 15 items
BAND ZEPHYR COMPRESS 30 LONG (HEMOSTASIS) ×1 IMPLANT
CATH INFINITI 6F ANG MULTIPACK (CATHETERS) ×1 IMPLANT
CATH LAUNCHER 6FR EBU3.5 (CATHETERS) ×1 IMPLANT
GLIDESHEATH SLEND SS 6F .021 (SHEATH) ×1 IMPLANT
GUIDEWIRE INQWIRE 1.5J.035X260 (WIRE) IMPLANT
INQWIRE 1.5J .035X260CM (WIRE) ×2
KIT HEART LEFT (KITS) ×2 IMPLANT
PACK CARDIAC CATHETERIZATION (CUSTOM PROCEDURE TRAY) ×2 IMPLANT
SYR MEDRAD MARK V 150ML (SYRINGE) ×1 IMPLANT
TRANSDUCER W/STOPCOCK (MISCELLANEOUS) ×2 IMPLANT
TUBING CIL FLEX 10 FLL-RA (TUBING) ×2 IMPLANT
TUBING CONTRAST HIGH PRESS 48 (TUBING) ×1 IMPLANT
VALVE GUARDIAN II ~~LOC~~ HEMO (MISCELLANEOUS) ×1 IMPLANT
WIRE EMERALD 3MM-J .035X260CM (WIRE) ×1 IMPLANT
WIRE EMERALD ST .035X150CM (WIRE) ×1 IMPLANT

## 2022-04-09 NOTE — ED Triage Notes (Signed)
Pt presents with mid-sternal and left chest pain that started around 7 am.

## 2022-04-09 NOTE — Progress Notes (Signed)
ANTICOAGULATION CONSULT NOTE - Initial Consult  Pharmacy Consult for heparin Indication: chest pain/ACS  Allergies  Allergen Reactions   Tiotropium Bromide Monohydrate     Other reaction(s): anaphylaxis    Patient Measurements: Height: '5\' 11"'$  (180.3 cm) Weight: 100.7 kg (222 lb) IBW/kg (Calculated) : 75.3 Heparin Dosing Weight: 96kg  Vital Signs: Temp: 98 F (36.7 C) (08/16 1350) Temp Source: Oral (08/16 1350) BP: 102/75 (08/16 1500) Pulse Rate: 66 (08/16 1500)  Labs: Recent Labs    04/09/22 1424  HGB 16.4  HCT 49.3  PLT 117*  CREATININE 1.12  TROPONINIHS 489*    Estimated Creatinine Clearance: 91.2 mL/min (by C-G formula based on SCr of 1.12 mg/dL).   Medical History: Past Medical History:  Diagnosis Date   Cancer ([place])    colon, liver, lymph node   COPD (chronic obstructive pulmonary disease) ([facility])    Hypertension     Medications:  (Not in a hospital admission)   Assessment: Pharmacy consulted to dose heparin in patient with chest pain/ACS.  Patient is not on anticoagulation prior to admission.  CBC WNL Trop 489  Goal of Therapy:  Heparin level 0.3-0.7 units/ml Monitor platelets by anticoagulation protocol: Yes   Plan:  Give 4000 units bolus x 1 Start heparin infusion at 1200 units/hr Check anti-Xa level in 6 hours and daily while on heparin Continue to monitor H&H and platelets  Margot Ables, PharmD Clinical Pharmacist 04/09/2022 3:31 PM

## 2022-04-09 NOTE — Interval H&P Note (Signed)
History and Physical Interval Note:  04/09/2022 5:46 PM  Kalim Kissel  has presented today for surgery, with the diagnosis of chest pain.  The various methods of treatment have been discussed with the patient and family. After consideration of risks, benefits and other options for treatment, the patient has consented to  Procedure(s): LEFT HEART CATH AND CORONARY ANGIOGRAPHY (N/A) as a surgical intervention.  The patient's history has been reviewed, patient examined, no change in status, stable for surgery.  I have reviewed the patient's chart and labs.  Questions were answered to the patient's satisfaction.    Cath Lab Visit (complete for each Cath Lab visit)  Clinical Evaluation Leading to the Procedure:   ACS: Yes.    Non-ACS:    Anginal Classification: CCS IV  Anti-ischemic medical therapy: Maximal Therapy (2 or more classes of medications)  Non-Invasive Test Results: No non-invasive testing performed  Prior CABG: No previous CABG        Early Osmond

## 2022-04-09 NOTE — H&P (Signed)
Cardiology Admission History and Physical:   Patient ID: Bob Ross; [id number]; 10/19/1965   Admission date: 04/09/2022  Primary Care Provider: Coolidge Breeze, FNP Primary Cardiologist: New to [facility] Primary Electrophysiologist: None  Chief Complaint: Chest tightness  Patient Profile:   Bob Ross is a 56 y.o. 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  He was not doing anything strenuous at the time.  Symptoms persisted, associated with diaphoresis and sense of breathlessness.  He does not report any preceding symptoms at baseline, follows with PCP in [facility].  History includes nonobstructive CAD documented at cardiac catheterization in 2015 at [facility] and more recently in 2021 through the [facility] cardiology system.  It looks like he had evidence of a cardiomyopathy with LVEF 40% at cardiac catheterization in 2021, although has not had regular follow-up for this.  Home regimen includes aspirin and Lipitor (had myalgias on 80 mg dose and it was cut back to 40 mg daily).  LDL was 86 in 2021, no recent lab work for review.  Patient evaluated in the ER, treated with nitroglycerin, morphine, ultimately IV nitroglycerin and heparin with improvement in chest pain but still present.  I personally reviewed his ECG which shows nonspecific inferolateral ST-T wave abnormalities, suggestive of ischemia.  Past Medical History:  Diagnosis Date   CAD (coronary artery disease)    Nonobstructive by cardiac catheterization 2021 [facility] cardiology   Cardiomyopathy [facility])    Colon cancer ([place])    Metastatic to liver and lymph nodes status  post surgery and chemotherapy 2005   Erectile dysfunction    Essential hypertension    History of pneumonia    Incisional hernia    Mixed hyperlipidemia     Past Surgical History:  Procedure Laterality Date   COLON SURGERY     COLOSTOMY     COLOSTOMY REVERSAL     HERNIA REPAIR     LIVER RESECTION       Medications Prior to Admission: Prior to Admission medications   Medication Sig Start Date End Date Taking? Authorizing Provider  acetaminophen (TYLENOL) 325 MG tablet Take 2 tablets (650 mg total) by mouth every 6 (six) hours as needed. 11/19/21   Jaynee Eagles, PA-C  albuterol (PROVENTIL) (2.5 MG/3ML) 0.083% nebulizer solution Take 3 mLs (2.5 mg total) by nebulization every 6 (six) hours as needed for wheezing or shortness of breath. 10/03/17   Triplett, Tammy, PA-C  albuterol (VENTOLIN HFA) 108 (90 Base) MCG/ACT inhaler Inhale 2 puffs into the lungs every 4 (four) hours as needed for wheezing or shortness of breath. 03/29/21   Noemi Chapel, MD  amoxicillin-clavulanate (AUGMENTIN) 875-125 MG tablet 1 tablet Orally every 12 hrs for 10 day(s) 04/02/22   [provider]  aspirin EC 81 MG tablet Take 81 mg by mouth daily.    [provider]  atorvastatin (LIPITOR) 80 MG tablet Take 80 mg by mouth daily at 6 PM.  09/28/17   [provider]  cyclobenzaprine (FLEXERIL) 10 MG tablet Take 1 tablet (10 mg total) by mouth 2 (two) times daily as needed for muscle spasms. Do not drink alcohol or drive while taking this  medication.  May cause drowsiness. 03/18/22   Volney American, PA-C  naproxen (NAPROSYN) 500 MG tablet Take 1 tablet (500 mg total) by mouth 2 (two) times daily with a meal. 03/25/22   Sanjuana Kava, MD  predniSONE (DELTASONE) 20 MG tablet Take 2 tablets (40 mg total) by mouth daily. 03/29/21   Noemi Chapel, MD  traMADol (ULTRAM) 50 MG tablet Take 1 tablet (50 mg total) by mouth every 6 (six) hours as needed. 11/19/21   Jaynee Eagles, PA-C     Allergies:     Allergies  Allergen Reactions   Tiotropium Bromide Monohydrate     Other reaction(s): anaphylaxis    Social History:   Social History   Tobacco Use   Smoking status: Every Day    Packs/day: 2.00    Types: Cigarettes   Smokeless tobacco: Never  Substance Use Topics   Alcohol use: Yes    Comment: Occasional     Family History:  The patient's family history includes AAA (abdominal aortic aneurysm) in his mother; Cancer in his father.    ROS:  Please see the history of present illness.  All other ROS reviewed and negative.     Physical Exam/Data:   Vitals:   04/09/22 1500 04/09/22 1530 04/09/22 1600 04/09/22 1615  BP: 102/75 1'07/86 95/71 97/74 '$  Pulse: 66 66 62 60  Resp: '17 12 14 13  '$ Temp:      TempSrc:      SpO2: 94% 98% 96% 95%  Weight:      Height:       No intake or output data in the 24 hours ending 04/09/22 1625 Filed Weights   04/09/22 1349  Weight: 100.7 kg   Body mass index is 30.96 kg/m.   Gen: Patient is in no acute distress, 3/10 chest pain. HEENT: Conjunctiva and lids normal, oropharynx clear. Neck: Supple, no elevated JVP or carotid bruits, no thyromegaly. Lungs: Clear to auscultation, nonlabored breathing at rest. Cardiac: Regular rate and rhythm, no S3 or significant systolic murmur, no pericardial rub. Abdomen: Large midline incisional hernia, nontender, bowel sounds present, no guarding or rebound. Extremities: No pitting edema, distal pulses 2+. Skin: Warm and dry. Musculoskeletal: No kyphosis. Neuropsychiatric: Alert and oriented x3, affect grossly appropriate.   EKG:  An ECG dated 04/09/2022 was personally reviewed today and demonstrated:  Sinus rhythm with PAC, inferolateral ST-T wave abnormalities.  Relevant CV Studies:  Cardiac catheterization 01/27/2020 ([facility]): Findings:  1. Diffuse non-obstructive CAD including 30% mid LAD stenosis (FFR =  0.90), 30% mid circumflex stenosis (Pd/Pa = 0.99), and 30% proximal RCA  stenosis  (Pd/Pa = 1.0)  2. Normal left ventricular filling pressures (LVEDP =  15 mmHg)  3. Decreased  left ventricular systolic function (estimated LVEF = 40 %)  with lateral wall hypokinesis   Laboratory Data:  Chemistry Recent Labs  Lab 04/09/22 1424  NA 140  K 4.3  CL 110  CO2 25  GLUCOSE 82  BUN 17  CREATININE 1.12  CALCIUM 9.1  GFRNONAA >60  ANIONGAP 5     Hematology Recent Labs  Lab 04/09/22 1424  WBC 11.4*  RBC 5.30  HGB 16.4  HCT 49.3  MCV 93.0  MCH 30.9  MCHC 33.3  RDW 14.8  PLT 117*   Cardiac Enzymes Recent Labs  Lab 04/09/22 1424  TROPONINIHS 489*    Radiology/Studies:  DG Chest Portable 1 View  Result Date: 04/09/2022 CLINICAL DATA:  Chest pain EXAM: PORTABLE CHEST 1 VIEW COMPARISON:  03/29/2021 FINDINGS: The heart size and mediastinal contours are within normal limits. Unchanged small, chronic right pleural effusion and/or pleural thickening with associated bandlike scarring or atelectasis of the right lung base. No acute appearing airspace opacity. The visualized skeletal structures are unremarkable. IMPRESSION: 1. Unchanged small, chronic right pleural effusion and/or pleural thickening with associated bandlike scarring or atelectasis of the right lung base. 2.  No acute appearing airspace opacity. Electronically Signed   By: Delanna Ahmadi M.D.   On: 04/09/2022 15:15    Assessment and Plan:   1.  Unstable angina with evolving NSTEMI, initial high-sensitivity troponin I 489 and nonspecific ST-T wave abnormalities inferolaterally.  At baseline patient has a history of mild, nonobstructive CAD documented at cardiac catheterization in 2021.  Possible plaque rupture at this time given sudden onset of symptoms this morning.  He continues to have chest pain on IV nitroglycerin and IV heparin, has received aspirin and morphine, on statin therapy.  2.  Secondary cardiomyopathy, LVEF 40% at cardiac catheterization in 2021.  Presumably nonischemic given reassuring  coronary status at that time.  Does not look like he has had regular follow-up for this or recent reevaluation of LVEF.  3.  Mixed hyperlipidemia, on Lipitor.  No recent lipids for review.  4.  Tobacco abuse.  5.  History of metastatic colon cancer status post surgery and chemotherapy in 2005 and with residual abdominal incisional hernia.  He has been in remission by report.  Situation reviewed with patient and wife.  Plan will be transfer to [facility] in anticipation of a diagnostic cardiac catheterization to assess for revascularization strategies today.  Risks and benefits considered and he is in agreement to proceed, had a radial cardiac catheterization in 2021 without incident.  He will need an echocardiogram as well.  Continue aspirin, Lipitor, IV nitroglycerin, IV heparin.  Current heart rate in the 09G and systolic around 283, hold off on further medications pending further evaluation.  Dr. Burt Knack is the accepting physician on our service at [facility].  Signed, Rozann Lesches, MD  04/09/2022 4:25 PM

## 2022-04-09 NOTE — ED Provider Notes (Signed)
[place] PROGRESSIVE CARE Provider Note  CSN: [id number] Arrival date & time: 04/09/22 1341  Chief Complaint(s) Chest Pain  HPI Bob Ross is a 56 y.o. male with PMH CAD with catheterization in 2021 with nonobstructive cardiac disease, colon cancer/renal cancer status post nephrectomy and chemotherapy, HTN, HLD who presents emergency department for evaluation of chest pain.  Patient states that he awoke around 4 AM this morning with no symptoms but abruptly at 7 AM this morning he started to have worsening severe onset midsternal chest pain.  He states that the pain feels like a pressure sitting on top of his chest with minimal radiation to his left arm.  He has no associated shortness of breath, diaphoresis, nausea or vomiting.  He does endorse mild exertional component to this pain.  He states it does feel similar to the previous episode of needing catheterization.  He states that most of his cardiac care was performed at [facility] but he has since moved and would like to establish cardiac care here closer to [facility].  He took 325 aspirin prior to arrival but has not taken any nitroglycerin.  Past Medical History Past Medical History:  Diagnosis Date   CAD (coronary artery disease)    Nonobstructive by cardiac catheterization 2021 [facility] cardiology   Cardiomyopathy [facility])    Colon cancer [facility])    Metastatic to liver and lymph nodes status post surgery and chemotherapy 2005   Erectile dysfunction    Essential hypertension    History of pneumonia    Incisional hernia    Mixed hyperlipidemia    Patient Active Problem List   Diagnosis Date Noted   NSTEMI (non-ST elevated myocardial infarction) ([place]) 04/09/2022   Otitis externa 04/08/2022   Tobacco abuse 04/08/2022   Coronary artery disease 01/19/2020   SOB (shortness of breath) 01/19/2020   Rupture of left distal biceps tendon 08/13/2018   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 Authorizing Provider  albuterol (PROVENTIL) (2.5 MG/3ML) 0.083% nebulizer solution Take 3 mLs (2.5 mg total) by nebulization every 6 (six) hours as needed for wheezing or shortness of breath. 10/03/17  Yes Triplett, Tammy, PA-C  albuterol (VENTOLIN HFA) 108 (90 Base) MCG/ACT inhaler Inhale 2 puffs into the lungs every 4 (four) hours as needed for wheezing or shortness of breath. 03/29/21  Yes Noemi Chapel, MD  aspirin EC 81 MG tablet Take 81 mg by mouth daily.   Yes [provider]  atorvastatin (LIPITOR) 80 MG tablet Take 80 mg by mouth daily at 6 PM.  09/28/17  Yes [provider]  naproxen (NAPROSYN) 500 MG tablet Take 1 tablet (500 mg total) by mouth 2 (two) times daily with a meal. 03/25/22  Yes Sanjuana Kava, MD  predniSONE (DELTASONE) 20 MG tablet Take 2 tablets (40 mg total) by mouth daily. 03/29/21  Yes Noemi Chapel, MD  traMADol (ULTRAM) 50 MG tablet Take 1 tablet (50 mg total) by mouth every 6 (six) hours as needed. 11/19/21  Yes Jaynee Eagles, PA-C  acetaminophen (TYLENOL) 325 MG tablet Take 2 tablets (650 mg total) by mouth every 6 (six) hours as needed. 11/19/21   Jaynee Eagles, PA-C  amoxicillin-clavulanate (AUGMENTIN) 875-125 MG tablet 1 tablet Orally every 12 hrs for 10 day(s) 04/02/22   [provider]  cyclobenzaprine (FLEXERIL) 10 MG tablet Take 1 tablet (10 mg total)  by mouth 2 (two) times daily as needed for muscle spasms. Do not drink alcohol or drive while taking this medication.  May cause drowsiness. 03/18/22   Volney American, PA-C                                                                                                                                    Past Surgical History Past Surgical History:  Procedure Laterality Date   COLON SURGERY     COLOSTOMY     COLOSTOMY REVERSAL     HERNIA REPAIR     LIVER RESECTION     Family  History Family History  Problem Relation Age of Onset   AAA (abdominal aortic aneurysm) Mother    Cancer Father     Social History Social History   Tobacco Use   Smoking status: Every Day    Packs/day: 2.00    Types: Cigarettes   Smokeless tobacco: Never  Substance Use Topics   Alcohol use: Yes    Comment: Occasional   Drug use: No   Allergies Tiotropium bromide monohydrate  Review of Systems Review of Systems  Cardiovascular:  Positive for chest pain.    Physical Exam Vital Signs  I have reviewed the triage vital signs BP 107/80 (BP Location: Left Arm)   Pulse (!) 59   Temp 97.7 F (36.5 C) (Oral)   Resp 12   Ht '5\' 11"'$  (1.803 m)   Wt 100.7 kg   SpO2 94%   BMI 30.96 kg/m   Physical Exam Constitutional:      General: He is not in acute distress.    Appearance: Normal appearance. He is ill-appearing.  HENT:     Head: Normocephalic and atraumatic.     Nose: No congestion or rhinorrhea.  Eyes:     General:        Right eye: No discharge.        Left eye: No discharge.     Extraocular Movements: Extraocular movements intact.     Pupils: Pupils are equal, round, and reactive to light.  Cardiovascular:     Rate and Rhythm: Normal rate and regular rhythm.     Heart sounds: No murmur heard. Pulmonary:     Effort: No respiratory distress.     Breath sounds: No wheezing or rales.  Abdominal:     General: There is no distension.     Tenderness: There is no abdominal tenderness.  Musculoskeletal:        General: Normal range of motion.     Cervical back: Normal range of motion.  Skin:    General: Skin is warm and dry.  Neurological:     General: No focal deficit present.     Mental Status: He is alert.     ED Results and Treatments Labs (all labs ordered are listed, but only abnormal results are displayed) Labs Reviewed  CBC - Abnormal; Notable for  the following components:      Result Value   WBC 11.4 (*)    Platelets 117 (*)    All other  components within normal limits  TROPONIN I (HIGH SENSITIVITY) - Abnormal; Notable for the following components:   Troponin I (High Sensitivity) 489 (*)    All other components within normal limits  TROPONIN I (HIGH SENSITIVITY) - Abnormal; Notable for the following components:   Troponin I (High Sensitivity) 1,146 (*)    All other components within normal limits  BASIC METABOLIC PANEL  CBC  LIPOPROTEIN A (LPA)  LIPID PANEL  BASIC METABOLIC PANEL  HEMOGLOBIN A1C  HIV ANTIBODY (ROUTINE TESTING W REFLEX)                                                                                                                          Radiology CARDIAC CATHETERIZATION  Result Date: 04/09/2022 1.  Mild obstructive coronary artery disease. 2.  Severely elevated LVEDP of 29 mmHg. 3.  Heavily calcified aortic valve with a mean gradient of 52 mmHg and a peak to peak gradient of 62 mmHg consistent with severe aortic stenosis; aortic waveforms demonstrated parvus and tardus. 4.  Preserved left ventricular ejection fraction. Recommendation: Echocardiogram to evaluate for severe aortic stenosis which will inform therapy moving forward.   DG Chest Portable 1 View  Result Date: 04/09/2022 CLINICAL DATA:  Chest pain EXAM: PORTABLE CHEST 1 VIEW COMPARISON:  03/29/2021 FINDINGS: The heart size and mediastinal contours are within normal limits. Unchanged small, chronic right pleural effusion and/or pleural thickening with associated bandlike scarring or atelectasis of the right lung base. No acute appearing airspace opacity. The visualized skeletal structures are unremarkable. IMPRESSION: 1. Unchanged small, chronic right pleural effusion and/or pleural thickening with associated bandlike scarring or atelectasis of the right lung base. 2.  No acute appearing airspace opacity. Electronically Signed   By: Delanna Ahmadi M.D.   On: 04/09/2022 15:15    Pertinent labs & imaging results that were available during my care of  the patient were reviewed by me and considered in my medical decision making (see MDM for details).  Medications Ordered in ED Medications  nitroGLYCERIN (NITROSTAT) SL tablet 0.4 mg ( Sublingual MAR Unhold 04/09/22 1928)  nitroGLYCERIN 50 mg in dextrose 5 % 250 mL (0.2 mg/mL) infusion (0 mcg/min Intravenous Stopped 04/09/22 1618)  heparin ADULT infusion 100 units/mL (25000 units/257m) (1,200 Units/hr Intravenous Infusion Verify 04/09/22 1553)  aspirin EC tablet 81 mg (has no administration in time range)  traMADol (ULTRAM) tablet 50 mg (has no administration in time range)  atorvastatin (LIPITOR) tablet 80 mg (has no administration in time range)  albuterol (PROVENTIL) (2.5 MG/3ML) 0.083% nebulizer solution 2.5 mg (has no administration in time range)  acetaminophen (TYLENOL) tablet 650 mg (has no administration in time range)  ondansetron (ZOFRAN) injection 4 mg (has no administration in time range)  sodium chloride flush (NS) 0.9 % injection 3 mL (3 mLs Intravenous Not Given 04/09/22  2127)  sodium chloride flush (NS) 0.9 % injection 3 mL (has no administration in time range)  0.9 %  sodium chloride infusion (has no administration in time range)  labetalol (NORMODYNE) injection 10 mg (has no administration in time range)  hydrALAZINE (APRESOLINE) injection 10 mg (has no administration in time range)  acetaminophen (TYLENOL) tablet 650 mg (has no administration in time range)  ondansetron (ZOFRAN) injection 4 mg (has no administration in time range)  0.9 %  sodium chloride infusion (has no administration in time range)  sodium chloride flush (NS) 0.9 % injection 3 mL (3 mLs Intravenous Given 04/09/22 2126)  sodium chloride flush (NS) 0.9 % injection 3 mL (has no administration in time range)  0.9 %  sodium chloride infusion (has no administration in time range)  lactated ringers bolus 1,000 mL (0 mLs Intravenous Stopped 04/09/22 1548)  acetaminophen (TYLENOL) tablet 1,000 mg (1,000 mg Oral Given  04/09/22 1511)  morphine (PF) 4 MG/ML injection 4 mg (4 mg Intravenous Given 04/09/22 1546)  ondansetron (ZOFRAN) injection 4 mg (4 mg Intravenous Given 04/09/22 1544)  heparin bolus via infusion 4,000 Units (4,000 Units Intravenous Bolus from Bag 04/09/22 1551)  sodium chloride 0.9 % bolus 500 mL (500 mLs Intravenous New Bag/Given 04/09/22 1628)  0.9 %  sodium chloride infusion (10 mL/hr Intravenous New Bag/Given 04/09/22 1756)                                                                                                                                     Procedures .Critical Care  Performed by: Teressa Lower, MD Authorized by: Teressa Lower, MD   Critical care provider statement:    Critical care time (minutes):  30   Critical care was necessary to treat or prevent imminent or life-threatening deterioration of the following conditions:  Cardiac failure   Critical care was time spent personally by me on the following activities:  Development of treatment plan with patient or surrogate, discussions with consultants, evaluation of patient's response to treatment, examination of patient, ordering and review of laboratory studies, ordering and review of radiographic studies, ordering and performing treatments and interventions, pulse oximetry, re-evaluation of patient's condition and review of old charts   (including critical care time)  Medical Decision Making / ED Course   This patient presents to the ED for concern of chest pain, this involves an extensive number of treatment options, and is a complaint that carries with it a high risk of complications and morbidity.  The differential diagnosis includes ACS, PE, pneumonia, GERD  MDM: Patient seen in the emergency department for evaluation of chest pain.  Physical exam reveals an ill-appearing patient clutching his chest with facial ruddiness and obvious discomfort.  Initial laboratory evaluation with a leukocytosis to 11.4 and chest x-ray  with no acute pathology.  ECG without significant ST elevation.  Nitroglycerin trial performed in the emergency department and he did have some  mild improvement with the initial dose of sublingual nitro but he had a significant headache with this dose.  Thus we decided to move to a nitroglycerin drip to slowly titrate the nitroglycerin for pain control without significant side effects.  Initial troponin was pending at time of signout and just as I was performing signout to the oncoming provider his troponin returned at 489.  Please see provider signout note for continuation of work-up.  Anticipate cardiology consultation and transfer to [facility] for catheterization.   Additional history obtained: -Additional history obtained from wife -External records from outside source obtained and reviewed including: Chart review including previous notes, labs, imaging, consultation notes   Lab Tests: -I ordered, reviewed, and interpreted labs.   The pertinent results include:   Labs Reviewed  CBC - Abnormal; Notable for the following components:      Result Value   WBC 11.4 (*)    Platelets 117 (*)    All other components within normal limits  TROPONIN I (HIGH SENSITIVITY) - Abnormal; Notable for the following components:   Troponin I (High Sensitivity) 489 (*)    All other components within normal limits  TROPONIN I (HIGH SENSITIVITY) - Abnormal; Notable for the following components:   Troponin I (High Sensitivity) 1,146 (*)    All other components within normal limits  BASIC METABOLIC PANEL  CBC  LIPOPROTEIN A (LPA)  LIPID PANEL  BASIC METABOLIC PANEL  HEMOGLOBIN A1C  HIV ANTIBODY (ROUTINE TESTING W REFLEX)      EKG   EKG Interpretation  Date/Time:  Wednesday April 09 2022 13:55:43 EDT Ventricular Rate:  77 PR Interval:  158 QRS Duration: 88 QT Interval:  384 QTC Calculation: 434 R Axis:   -28 Text Interpretation: Sinus rhythm with marked sinus arrhythmia Left ventricular  hypertrophy with repolarization abnormality ( R in aVL ) Abnormal ECG When compared with ECG of 29-Mar-2021 12:20, QRS axis Shifted left No significant change since last tracing Confirmed by Isla Pence [phone]) on 04/09/2022 3:09:58 PM         Imaging Studies ordered: I ordered imaging studies including chest x-ray I independently visualized and interpreted imaging. I agree with the radiologist interpretation   Medicines ordered and prescription drug management: Meds ordered this encounter  Medications   lactated ringers bolus 1,000 mL   nitroGLYCERIN (NITROSTAT) SL tablet 0.4 mg   DISCONTD: aspirin tablet 325 mg   acetaminophen (TYLENOL) tablet 1,000 mg   nitroGLYCERIN 50 mg in dextrose 5 % 250 mL (0.2 mg/mL) infusion   morphine (PF) 4 MG/ML injection 4 mg   ondansetron (ZOFRAN) injection 4 mg   heparin bolus via infusion 4,000 Units   heparin ADULT infusion 100 units/mL (25000 units/245m)   aspirin EC tablet 81 mg   traMADol (ULTRAM) tablet 50 mg   atorvastatin (LIPITOR) tablet 80 mg   albuterol (PROVENTIL) (2.5 MG/3ML) 0.083% nebulizer solution 2.5 mg   acetaminophen (TYLENOL) tablet 650 mg   ondansetron (ZOFRAN) injection 4 mg   sodium chloride flush (NS) 0.9 % injection 3 mL   sodium chloride flush (NS) 0.9 % injection 3 mL   0.9 %  sodium chloride infusion   DISCONTD: 0.9 %  sodium chloride infusion   DISCONTD: sodium chloride 0.9 % bolus 500 mL   sodium chloride 0.9 % bolus 500 mL   DISCONTD: aspirin chewable tablet   0.9 %  sodium chloride infusion   DISCONTD: midazolam (VERSED) injection   DISCONTD: fentaNYL (SUBLIMAZE) injection   DISCONTD: lidocaine (PF) (  XYLOCAINE) 1 % injection   DISCONTD: Radial Cocktail/Verapamil only   DISCONTD: heparin sodium (porcine) injection   DISCONTD: iohexol (OMNIPAQUE) 350 MG/ML injection   DISCONTD: Heparin (Porcine) in NaCl 1000-0.9 UT/500ML-% SOLN   labetalol (NORMODYNE) injection 10 mg   hydrALAZINE (APRESOLINE)  injection 10 mg   acetaminophen (TYLENOL) tablet 650 mg   ondansetron (ZOFRAN) injection 4 mg   0.9 %  sodium chloride infusion   sodium chloride flush (NS) 0.9 % injection 3 mL   sodium chloride flush (NS) 0.9 % injection 3 mL   0.9 %  sodium chloride infusion    -I have reviewed the patients home medicines and have made adjustments as needed  Critical interventions Nitroglycerin, expedited cardiac work-up   Cardiac Monitoring: The patient was maintained on a cardiac monitor.  I personally viewed and interpreted the cardiac monitored which showed an underlying rhythm of: NSR  Social Determinants of Health:  Factors impacting patients care include: none   Reevaluation: After the interventions noted above, I reevaluated the patient and found that they have :improved  Co morbidities that complicate the patient evaluation  Past Medical History:  Diagnosis Date   CAD (coronary artery disease)    Nonobstructive by cardiac catheterization 2021 [facility] cardiology   Cardiomyopathy [facility])    Colon cancer ([place])    Metastatic to liver and lymph nodes status post surgery and chemotherapy 2005   Erectile dysfunction    Essential hypertension    History of pneumonia    Incisional hernia    Mixed hyperlipidemia       Dispostion: I considered admission for this patient, and disposition currently pending but anticipate urgent transfer to [facility] for likely catheterization.     Final Clinical Impression(s) / ED Diagnoses Final diagnoses:  NSTEMI (non-ST elevated myocardial infarction) [facility])     '@PCDICTATION'$ @    Teressa Lower, MD 04/09/22 2312

## 2022-04-09 NOTE — ED Notes (Signed)
Pt left with [facility]

## 2022-04-09 NOTE — ED Provider Notes (Signed)
Pt signed out by Dr. Matilde Sprang pending labs.  Pt's troponin came back elevated at 489.  Pt told the nurse his pain was coming back, and he looked uncomfortable.  Pt d/w Dr. Domenic Polite who will come see pt.  Pt started on a nitro and a heparin drip.  He is given morphine as well for his pain.  Dr. Domenic Polite will admit.  Heart cath is planned.   Isla Pence, MD 04/09/22 1630

## 2022-04-10 ENCOUNTER — Observation Stay (HOSPITAL_COMMUNITY): Payer: BC Managed Care – PPO

## 2022-04-10 ENCOUNTER — Other Ambulatory Visit: Payer: Self-pay | Admitting: Cardiology

## 2022-04-10 ENCOUNTER — Encounter (HOSPITAL_COMMUNITY): Payer: Self-pay | Admitting: Internal Medicine

## 2022-04-10 DIAGNOSIS — L538 Other specified erythematous conditions: Secondary | ICD-10-CM | POA: Diagnosis present

## 2022-04-10 DIAGNOSIS — D72829 Elevated white blood cell count, unspecified: Secondary | ICD-10-CM | POA: Diagnosis present

## 2022-04-10 DIAGNOSIS — C787 Secondary malignant neoplasm of liver and intrahepatic bile duct: Secondary | ICD-10-CM | POA: Diagnosis not present

## 2022-04-10 DIAGNOSIS — E782 Mixed hyperlipidemia: Secondary | ICD-10-CM | POA: Diagnosis present

## 2022-04-10 DIAGNOSIS — I214 Non-ST elevation (NSTEMI) myocardial infarction: Secondary | ICD-10-CM | POA: Diagnosis not present

## 2022-04-10 DIAGNOSIS — J439 Emphysema, unspecified: Secondary | ICD-10-CM | POA: Diagnosis not present

## 2022-04-10 DIAGNOSIS — Q231 Congenital insufficiency of aortic valve: Secondary | ICD-10-CM | POA: Diagnosis not present

## 2022-04-10 DIAGNOSIS — D696 Thrombocytopenia, unspecified: Secondary | ICD-10-CM | POA: Diagnosis not present

## 2022-04-10 DIAGNOSIS — Z952 Presence of prosthetic heart valve: Secondary | ICD-10-CM | POA: Diagnosis not present

## 2022-04-10 DIAGNOSIS — E669 Obesity, unspecified: Secondary | ICD-10-CM | POA: Diagnosis present

## 2022-04-10 DIAGNOSIS — J449 Chronic obstructive pulmonary disease, unspecified: Secondary | ICD-10-CM

## 2022-04-10 DIAGNOSIS — I428 Other cardiomyopathies: Secondary | ICD-10-CM | POA: Diagnosis not present

## 2022-04-10 DIAGNOSIS — I7143 Infrarenal abdominal aortic aneurysm, without rupture: Secondary | ICD-10-CM | POA: Diagnosis not present

## 2022-04-10 DIAGNOSIS — K036 Deposits [accretions] on teeth: Secondary | ICD-10-CM | POA: Diagnosis present

## 2022-04-10 DIAGNOSIS — F17210 Nicotine dependence, cigarettes, uncomplicated: Secondary | ICD-10-CM | POA: Diagnosis present

## 2022-04-10 DIAGNOSIS — I25110 Atherosclerotic heart disease of native coronary artery with unstable angina pectoris: Secondary | ICD-10-CM | POA: Diagnosis not present

## 2022-04-10 DIAGNOSIS — K06020 Generalized gingival recession, unspecified: Secondary | ICD-10-CM | POA: Diagnosis present

## 2022-04-10 DIAGNOSIS — I723 Aneurysm of iliac artery: Secondary | ICD-10-CM | POA: Diagnosis not present

## 2022-04-10 DIAGNOSIS — Z20822 Contact with and (suspected) exposure to covid-19: Secondary | ICD-10-CM | POA: Diagnosis not present

## 2022-04-10 DIAGNOSIS — K045 Chronic apical periodontitis: Secondary | ICD-10-CM | POA: Diagnosis present

## 2022-04-10 DIAGNOSIS — R7303 Prediabetes: Secondary | ICD-10-CM | POA: Diagnosis present

## 2022-04-10 DIAGNOSIS — I5043 Acute on chronic combined systolic (congestive) and diastolic (congestive) heart failure: Secondary | ICD-10-CM | POA: Diagnosis not present

## 2022-04-10 DIAGNOSIS — K029 Dental caries, unspecified: Secondary | ICD-10-CM | POA: Diagnosis present

## 2022-04-10 DIAGNOSIS — K439 Ventral hernia without obstruction or gangrene: Secondary | ICD-10-CM | POA: Diagnosis present

## 2022-04-10 DIAGNOSIS — I350 Nonrheumatic aortic (valve) stenosis: Secondary | ICD-10-CM

## 2022-04-10 DIAGNOSIS — I110 Hypertensive heart disease with heart failure: Secondary | ICD-10-CM | POA: Diagnosis not present

## 2022-04-10 DIAGNOSIS — Z006 Encounter for examination for normal comparison and control in clinical research program: Secondary | ICD-10-CM | POA: Diagnosis not present

## 2022-04-10 LAB — LIPID PANEL
Cholesterol: 126 mg/dL (ref 0–200)
HDL: 36 mg/dL — ABNORMAL LOW (ref >40)
LDL Cholesterol: 28 mg/dL (ref 0–99)
Total CHOL/HDL Ratio: 3.5 RATIO
Triglycerides: 309 mg/dL — ABNORMAL HIGH (ref <150)
VLDL: 62 mg/dL — ABNORMAL HIGH (ref 0–40)

## 2022-04-10 LAB — CBC
HCT: 44.7 % (ref 39.0–52.0)
Hemoglobin: 15.1 g/dL (ref 13.0–17.0)
MCH: 31.4 pg (ref 26.0–34.0)
MCHC: 33.8 g/dL (ref 30.0–36.0)
MCV: 92.9 fL (ref 80.0–100.0)
Platelets: 101 10*3/uL — ABNORMAL LOW (ref 150–400)
RBC: 4.81 MIL/uL (ref 4.22–5.81)
RDW: 14.9 % (ref 11.5–15.5)
WBC: 10.5 10*3/uL (ref 4.0–10.5)
nRBC: 0 % (ref 0.0–0.2)

## 2022-04-10 LAB — BASIC METABOLIC PANEL
Anion gap: 4 — ABNORMAL LOW (ref 5–15)
BUN: 18 mg/dL (ref 6–20)
CO2: 24 mmol/L (ref 22–32)
Calcium: 8.2 mg/dL — ABNORMAL LOW (ref 8.9–10.3)
Chloride: 111 mmol/L (ref 98–111)
Creatinine, Ser: 1.21 mg/dL (ref 0.61–1.24)
GFR, Estimated: >60 mL/min (ref >60)
Glucose, Bld: 106 mg/dL — ABNORMAL HIGH (ref 70–99)
Potassium: 4.2 mmol/L (ref 3.5–5.1)
Sodium: 139 mmol/L (ref 135–145)

## 2022-04-10 LAB — ECHOCARDIOGRAM COMPLETE
AR max vel: 0.65 cm2
AV Area VTI: 0.52 cm2
AV Area mean vel: 0.55 cm2
AV Mean grad: 48.7 mmHg
AV Peak grad: 73.7 mmHg
Ao pk vel: 4.29 m/s
Area-P 1/2: 4.89 cm2
Calc EF: 52.6 %
Height: 71 in
S' Lateral: 4.1 cm
Single Plane A2C EF: 55 %
Single Plane A4C EF: 45.5 %
Weight: 3529.6 oz

## 2022-04-10 LAB — HEMOGLOBIN A1C
Hgb A1c MFr Bld: 6.2 % — ABNORMAL HIGH (ref 4.8–5.6)
Mean Plasma Glucose: 131.24 mg/dL

## 2022-04-10 LAB — HIV ANTIBODY (ROUTINE TESTING W REFLEX): HIV Screen 4th Generation wRfx: NONREACTIVE

## 2022-04-10 LAB — HEPARIN LEVEL (UNFRACTIONATED): Heparin Unfractionated: <0.1 IU/mL — ABNORMAL LOW (ref 0.30–0.70)

## 2022-04-10 MED ORDER — ENOXAPARIN SODIUM 40 MG/0.4ML IJ SOSY
40.0000 mg | PREFILLED_SYRINGE | Freq: Every day | INTRAMUSCULAR | Status: AC
  Administered 2022-04-10 – 2022-04-16 (×7): 40 mg via SUBCUTANEOUS
  Filled 2022-04-10 (×7): qty 0.4

## 2022-04-10 MED ORDER — IOHEXOL 350 MG/ML SOLN
80.0000 mL | Freq: Once | INTRAVENOUS | Status: AC | PRN
  Administered 2022-04-10: 80 mL via INTRAVENOUS

## 2022-04-10 MED ORDER — NICOTINE 21 MG/24HR TD PT24
21.0000 mg | MEDICATED_PATCH | Freq: Every day | TRANSDERMAL | Status: DC
  Administered 2022-04-10 – 2022-04-18 (×9): 21 mg via TRANSDERMAL
  Filled 2022-04-10 (×9): qty 1

## 2022-04-10 MED ORDER — SENNOSIDES-DOCUSATE SODIUM 8.6-50 MG PO TABS
2.0000 | ORAL_TABLET | Freq: Two times a day (BID) | ORAL | Status: DC | PRN
  Administered 2022-04-10: 2 via ORAL
  Filled 2022-04-10 (×2): qty 2

## 2022-04-10 MED ORDER — FUROSEMIDE 10 MG/ML IJ SOLN
40.0000 mg | Freq: Once | INTRAMUSCULAR | Status: AC
  Administered 2022-04-10: 40 mg via INTRAVENOUS
  Filled 2022-04-10: qty 4

## 2022-04-10 NOTE — Progress Notes (Signed)
  Echocardiogram 2D Echocardiogram has been performed.  Bob Ross 04/10/2022, 9:54 AM

## 2022-04-10 NOTE — H&P (View-Only) (Signed)
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  The patient reports feeling well until yesterday morning when he developed severe substernal chest tightness about the time he arrived at work.  He was not doing any strenuous activity.  This persisted and was associated with shortness of breath and diaphoresis.  He presented to the emergency room at [facility].  Electrocardiogram showed nonspecific inferolateral ST-T wave changes suggesting ischemia.  Initial troponin was 489 and increased to 1146.  He was treated with nitroglycerin, morphine, and IV nitroglycerin with heparin with improvement in his pain but it persisted and he was transferred to [facility] for further evaluation.  Cardiac catheterization yesterday showed mild diffuse disease throughout the LAD.  There is moderate diffuse disease throughout the right coronary artery.  LVEDP was elevated at 29.  The aortic valve had a mean gradient of 52 mmHg with a peak to peak gradient of 62 mmHg consistent with severe aortic stenosis.  He had an echocardiogram today showing a calcified and thickened aortic valve with a mean gradient of 49 mmHg and a peak gradient of 74 mmHg with a valve area of 0.52 cm by VTI.  Left  ventricular ejection fraction was 50 to 55%.  He had a gated cardiac CTA today which has not been officially read but appears to show a heavily calcified bicuspid aortic valve.  His abdominal and pelvic CTA shows severe diffuse aortoiliac atherosclerosis with calcified and noncalcified plaque.  There is a 3.1 cm infrarenal abdominal aortic aneurysm.  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  He denies any prior chest pain or pressure.  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  He denies any dizziness or syncope.  Has had no orthopnea.  Denies peripheral edema.  He does use inhalers for wheezing frequently.  The patient underwent multiple surgeries related to his metastatic colon cancer and abdominal wall hernias.  He had a colon resection with colostomy as well as partial liver resection.  He was treated with chemotherapy.  He subsequently developed abdominal wall infection and recurrent abdominal wall hernias and had to have multiple surgeries, use of mesh, wound vacs, and eventually healed but his abdominal wall has basically been a large hernia.  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  He has noted chronic problems with his teeth that he relates to chemotherapy.  He does not see a dentist regularly. Past Medical History:  Diagnosis Date   CAD (coronary  artery disease)    Nonobstructive by cardiac catheterization 2021 [facility] cardiology   Cardiomyopathy [facility])    Colon cancer ([place])    Metastatic to liver and lymph nodes status post surgery and chemotherapy 2005   Erectile dysfunction    Essential hypertension    History of pneumonia    Incisional hernia    Mixed hyperlipidemia     Past Surgical History:  Procedure Laterality Date   COLON SURGERY     COLOSTOMY     COLOSTOMY  REVERSAL     HERNIA REPAIR     LEFT HEART CATH AND CORONARY ANGIOGRAPHY N/A 04/09/2022   Procedure: LEFT HEART CATH AND CORONARY ANGIOGRAPHY;  Surgeon: Early Osmond, MD;  Location: [facility];  Service: Cardiovascular;  Laterality: N/A;   LIVER RESECTION      Family History  Problem Relation Age of Onset   AAA (abdominal aortic aneurysm) Mother    Cancer Father     Social History   Socioeconomic History   Marital status: Married    Spouse name: Not on file   Number of children: Not on file   Years of education: Not on file   Highest education level: Not on file  Occupational History   Not on file  Tobacco Use   Smoking status: Every Day    Packs/day: 2.00    Types: Cigarettes   Smokeless tobacco: Never  Substance and Sexual Activity   Alcohol use: Yes    Comment: Occasional   Drug use: No   Sexual activity: Not on file  Other Topics Concern   Not on file  Social History Narrative   Not on file   Social Determinants of Health   Financial Resource Strain: Not on file  Food Insecurity: Not on file  Transportation Needs: Not on file  Physical Activity: Not on file  Stress: Not on file  Social Connections: Not on file  Intimate Partner Violence: Not on file    Prior to Admission medications   Medication Sig Start Date End Date Taking? Authorizing Provider  albuterol (PROVENTIL) (2.5 MG/3ML) 0.083% nebulizer solution Take 3 mLs (2.5 mg total) by nebulization every 6 (six) hours as needed for wheezing or shortness of breath. Patient taking differently: Take 2.5 mg by nebulization as needed for wheezing or shortness of breath. 10/03/17  Yes Triplett, Tammy, PA-C  albuterol (VENTOLIN HFA) 108 (90 Base) MCG/ACT inhaler Inhale 2 puffs into the lungs every 4 (four) hours as needed for wheezing or shortness of breath. Patient taking differently: Inhale 2 puffs into the lungs daily as needed for wheezing or shortness of breath. 03/29/21  Yes Noemi Chapel, MD  aspirin  EC 81 MG tablet Take 81 mg by mouth every other day.   Yes [provider]  aspirin-acetaminophen-caffeine (EXCEDRIN MIGRAINE) [phone] MG tablet Take 2 tablets by mouth daily as needed for headache or migraine.   Yes [provider]  atorvastatin (LIPITOR) 40 MG tablet Take 40 mg by mouth daily.   Yes [provider]  cholecalciferol (VITAMIN D3) 25 MCG (1000 UNIT) tablet Take 1,000 Units by mouth daily.   Yes [provider]  Cyanocobalamin (B-12 PO) Take 1 capsule by mouth daily.   Yes [provider]  Fluticasone-Umeclidin-Vilant (TRELEGY ELLIPTA) 100-62.5-25 MCG/ACT AEPB Inhale 1 puff into the lungs daily.   Yes [provider]  ibuprofen (ADVIL) 800 MG tablet Take 800 mg by mouth daily as needed for headache.   Yes [provider]  lisinopril (ZESTRIL) 10 MG tablet Take 10 mg by mouth at bedtime.   Yes [provider]  naproxen (NAPROSYN) 500 MG tablet Take 1 tablet (500 mg total) by mouth 2 (two) times daily with a meal. 03/25/22  Yes Sanjuana Kava, MD  Omega-3 Fatty Acids (FISH OIL PO) Take 1 capsule by mouth daily.   Yes [provider]  senna-docusate (SENNA S) 8.6-50 MG tablet Take 1-2 tablets by mouth See admin instructions. Take 2 tablets in the morning and 1 tablet at night   Yes [provider]  traMADol (ULTRAM) 50 MG tablet Take 1 tablet (50 mg total) by mouth every 6 (six) hours as needed. Patient taking differently: Take 50 mg by mouth daily as needed for moderate pain. 11/19/21  Yes Jaynee Eagles, PA-C  amoxicillin-clavulanate (AUGMENTIN) 875-125 MG tablet 1 tablet Orally every 12 hrs for 10 day(s) Patient not taking: Reported on 04/10/2022 04/02/22   [provider]  atorvastatin (LIPITOR) 80 MG tablet Take 80 mg by mouth daily at 6 PM.  Patient not taking: Reported on 04/10/2022 09/28/17   [provider]  cyclobenzaprine (FLEXERIL) 10 MG tablet Take 1 tablet (10 mg total) by  mouth 2 (two) times daily as needed for muscle spasms. Do not drink alcohol or drive while taking this medication.  May cause drowsiness. Patient not taking: Reported on 04/10/2022 03/18/22   Volney American, PA-C  predniSONE (DELTASONE) 20 MG tablet Take 2 tablets (40 mg total) by mouth daily. Patient not taking: Reported on 04/10/2022 03/29/21   Noemi Chapel, MD    Current Facility-Administered Medications  Medication Dose Route Frequency Provider Last Rate Last Admin   0.9 %  sodium chloride infusion  250 mL Intravenous PRN Ahmed Prima, Marye Round M, PA-C       0.9 %  sodium chloride infusion  250 mL Intravenous PRN Early Osmond, MD       acetaminophen (TYLENOL) tablet 650 mg  650 mg Oral Q4H PRN Ahmed Prima, Brittany M, PA-C       acetaminophen (TYLENOL) tablet 650 mg  650 mg Oral Q4H PRN Early Osmond, MD       albuterol (PROVENTIL) (2.5 MG/3ML) 0.083% nebulizer solution 2.5 mg  2.5 mg Nebulization Q6H PRN Strader, Brittany M, PA-C       aspirin EC tablet 81 mg  81 mg Oral Daily Bernerd Pho M, PA-C   81 mg at 04/10/22 0857   atorvastatin (LIPITOR) tablet 80 mg  80 mg Oral [id number] Erma Heritage, PA-C   80 mg at 04/10/22 1644   enoxaparin (LOVENOX) injection 40 mg  40 mg Subcutaneous Daily Reino Bellis B, NP   40 mg at 04/10/22 1001   nicotine (NICODERM CQ - dosed in mg/24 hours) patch 21 mg  21 mg Transdermal Daily Ledora Bottcher, PA   21 mg at 04/10/22 1747   nitroGLYCERIN (NITROSTAT) SL tablet 0.4 mg  0.4 mg Sublingual Q5 min PRN Bernerd Pho M, PA-C   0.4 mg at 04/09/22 1451   ondansetron (ZOFRAN) injection 4 mg  4 mg Intravenous Q6H PRN Ahmed Prima, [place] M, PA-C       ondansetron [facility]) injection 4 mg  4 mg Intravenous Q6H PRN Early Osmond, MD       sodium chloride flush (NS) 0.9 % injection 3 mL  3 mL Intravenous Q12H Strader, [place] M, PA-C       sodium chloride flush (NS) 0.9 % injection 3 mL  3 mL Intravenous PRN  Ahmed Prima, [place] M, PA-C       sodium  chloride flush (NS) 0.9 % injection 3 mL  3 mL Intravenous Q12H Early Osmond, MD   3 mL at 04/10/22 1002   sodium chloride flush (NS) 0.9 % injection 3 mL  3 mL Intravenous PRN Early Osmond, MD       traMADol Veatrice Bourbon) tablet 50 mg  50 mg Oral Q6H PRN Ahmed Prima, [place] M, PA-C        Allergies  Allergen Reactions   Tiotropium Bromide Monohydrate Anaphylaxis      Review of Systems:   General:  normal appetite, normal energy, no weight gain, no weight loss, no fever  Cardiac:  + chest pain with exertion, + chest pain at rest, no SOB with  exertion, no resting SOB, no PND, no orthopnea, no palpitations, no arrhythmia, no atrial fibrillation, no LE edema, no dizzy spells, no syncope  Respiratory:  no shortness of breath, no home oxygen, no productive cough, no dry cough, no bronchitis, + wheezing, no hemoptysis, no asthma, no pain with inspiration or cough, no sleep apnea, no CPAP at night  GI:   no difficulty swallowing, no reflux, no frequent heartburn, no hiatal hernia, no abdominal pain, no constipation, no diarrhea, no hematochezia, no hematemesis, no melena  GU:   no dysuria,  no frequency, no urinary tract infection, no hematuria, no enlarged prostate, no kidney stones, + kidney disease  Vascular:  no pain suggestive of claudication, no pain in feet, no leg cramps, no varicose veins, no DVT, no non-healing foot ulcer  Neuro:   no stroke, no TIA's, no seizures, no headaches, no temporary blindness one eye,  no slurred speech, no peripheral neuropathy, no chronic pain, no instability of gait, no memory/cognitive dysfunction  Musculoskeletal: no arthritis, no joint swelling, no myalgias, no difficulty walking, normal mobility   Skin:   no rash, no itching, no skin infections, no pressure sores or ulcerations  Psych:   no anxiety, no depression, no nervousness, no unusual recent stress  Eyes:   no blurry vision, no floaters, no recent vision changes, no glasses or contacts  ENT:   no  hearing loss, no loose or painful teeth, no dentures, last saw dentist many years ago  Hematologic:  no easy bruising, no abnormal bleeding, no clotting disorder, no frequent epistaxis  Endocrine:  no diabetes, does not check CBG's at home     Physical Exam:   BP 115/83 (BP Location: Left Arm)   Pulse 74   Temp 98.1 F (36.7 C) (Oral)   Resp 16   Ht '5\' 11"'$  (1.803 m)   Wt 100.1 kg   SpO2 94%   BMI 30.77 kg/m   General:  well-appearing  HEENT:  Unremarkable, NCAT, PERLA, EOMI, poor dentition  Neck:   no JVD, no bruits, no adenopathy   Chest:   clear to auscultation, symmetrical breath sounds, no wheezes, no rhonchi   CV:   RRR, 3/6 systolic murmur RSB, no diastolic murmur  Abdomen:  soft, non-tender, no masses, very large ventral hernia extending from the xyphoid process to lower abdomen.  Extremities:  warm, well-perfused, pulses palpable at ankles, no lower extremity edema  Rectal/GU  Deferred  Neuro:   Grossly non-focal and symmetrical throughout  Skin:   Clean and dry, no rashes, no breakdown  Diagnostic Tests:  Lab Results: Recent Labs    04/09/22 1424 04/10/22 0142  WBC 11.4* 10.5  HGB 16.4 15.1  HCT 49.3 44.7  PLT 117* 101*   BMET:  Recent Labs    04/09/22 1424 04/10/22 0142  NA 140 139  K 4.3 4.2  CL 110 111  CO2 25 24  GLUCOSE 82 106*  BUN 17 18  CREATININE 1.12 1.21  CALCIUM 9.1 8.2*    Physicians  Panel Physicians Referring Physician Case Authorizing Physician  Early Osmond, MD (Primary)     Procedures  LEFT HEART CATH AND CORONARY ANGIOGRAPHY   Conclusion  1.  Mild obstructive coronary artery disease. 2.  Severely elevated LVEDP of 29 mmHg. 3.  Heavily calcified aortic valve with a mean gradient of 52 mmHg and a peak to peak gradient of 62 mmHg consistent with severe aortic stenosis; aortic waveforms demonstrated parvus and tardus. 4.  Preserved left ventricular ejection fraction.   Recommendation: Echocardiogram to evaluate for  severe aortic stenosis which will inform therapy moving forward.   Indications  Acute coronary syndrome (HCC) [I24.9 (ICD-10-CM)]   Clinical Presentation  CHF/Shock Congestive heart failure present. NYHA Class III. No shock present.   Procedural Details  Technical Details The patient is a 56 year old male with a history of nonobstructive coronary artery disease on cardiac catheterization in 2021, metastatic colon cancer status postsurgery and chemotherapy in remission and ongoing tobacco abuse who developed acute onset chest pain earlier today.  He was found to have developed an acute coronary syndrome.  He was unable to be rendered chest pain-free with medications.  For this reason he was referred for an urgent invasive assessment due to the indication of acute coronary syndrome refractory to medical therapy.  After obtaining consent the patient was brought to the cardiac catheterization laboratory and prepped and draped in a sterile fashion.  Xylocaine was used to anesthetize the right wrist and 6 French Terumo glide sheath placed there.  5000 units heparin and 5 mg of verapamil were administered through the sheath.  Coronary angiography was done with a JR4 diagnostic catheter and EBU 3 5 guide.  We noticed that the patient's arterial waveform demonstrated parvus and tardus.  Additionally we noticed a very large jet from the aortic valve on catheter manipulation and that the aortic valve was severely calcified.  After ruling out obstructive coronary artery disease we elected to cross the valve.  A left ventricular end-diastolic pressure was measured and found to be 29 mmHg.  Ventriculography in the RAO projection demonstrated a normal ejection fraction with no significant wall motion abnormalities and no evidence of Takotsubo cardiomyopathy.  The peak to peak gradient across the aortic valve was 62 mmHg and the mean gradient was 52 mmHg consistent with severe aortic stenosis.  At the conclusion of  the procedure the patient was comfortable and chest pain-free.  A TR band was placed.  There are no acute complications. Estimated blood loss <50 mL.   During this procedure medications were administered to achieve and maintain moderate conscious sedation while the patient's heart rate, blood pressure, and oxygen saturation were continuously monitored and I was present face-to-face 100% of this time.   Medications (Filter: Administrations occurring from 1741 to 1926 on 04/09/22)  important  Continuous medications are totaled by the amount administered until 04/09/22 1926.   aspirin chewable tablet (mg) Total dose:  324 mg  Date/Time Rate/Dose/Volume Action   04/09/22 1755 324 mg Given    0.9 %  sodium chloride infusion (mL/hr) Total dose:  Cannot be calculated*  *Continuous medication not stopped within the calculation time range. Date/Time Rate/Dose/Volume Action   04/09/22  1756 10 mL/hr New Bag/Given    midazolam (VERSED) injection (mg) Total dose:  1 mg  Date/Time Rate/Dose/Volume Action   04/09/22 1801 1 mg Given    fentaNYL (SUBLIMAZE) injection (mcg) Total dose:  25 mcg  Date/Time Rate/Dose/Volume Action   04/09/22 1801 25 mcg Given    lidocaine (PF) (XYLOCAINE) 1 % injection (mL) Total volume:  2 mL  Date/Time Rate/Dose/Volume Action   04/09/22 1808 2 mL Given    Radial Cocktail/Verapamil only (mL) Total volume:  10 mL  Date/Time Rate/Dose/Volume Action   04/09/22 1809 10 mL Given    heparin sodium (porcine) injection (Units) Total dose:  5,000 Units  Date/Time Rate/Dose/Volume Action   04/09/22 1809 5,000 Units Given    iohexol (OMNIPAQUE) 350 MG/ML injection (mL) Total volume:  105 mL  Date/Time Rate/Dose/Volume Action   04/09/22 1853 105 mL Given    Heparin (Porcine) in NaCl 1000-0.9 UT/500ML-% SOLN (mL) Total volume:  1,000 mL  Date/Time Rate/Dose/Volume Action   04/09/22 1838 500 mL Given   1838 500 mL Given    Sedation Time  Sedation  Time Physician-1: 50 minutes 8 seconds Contrast  Medication Name Total Dose  iohexol (OMNIPAQUE) 350 MG/ML injection 105 mL   Radiation/Fluoro  Fluoro time: 7.3, 7.3 (min) DAP: 28003 (mGycm2) Cumulative Air Kerma: 462, 703 (mGy) Complications  Complications documented before study signed (04/09/2022  5:00 PM)   No complications were associated with this study.  Documented by Early Osmond, MD - 04/09/2022  7:04 PM     Coronary Findings  Diagnostic Dominance: Right Left Anterior Descending  There is mild diffuse disease throughout the vessel.    Right Coronary Artery  There is moderate diffuse disease throughout the vessel.    Intervention   No interventions have been documented.   Coronary Diagrams  Diagnostic Dominance: Right  Intervention  Implants     No implant documentation for this case.   Syngo Images   Show images for CARDIAC CATHETERIZATION Images on Long Term Storage   Show images for [place], Gentle to Procedure Log  Procedure Log    Hemo Data  Flowsheet Row Most Recent Value  Aortic Mean Gradient 52.46 mmHg  Aortic Peak Gradient 93.8 mmHg  AO Systolic Pressure 182 mmHg  AO Diastolic Pressure 78 mmHg  AO Mean 93 mmHg  LV Systolic Pressure 993 mmHg  LV Diastolic Pressure 15 mmHg  LV EDP 29 mmHg  AOp Systolic Pressure 716 mmHg  AOp Diastolic Pressure 71 mmHg  AOp Mean Pressure 88 mmHg  LVp Systolic Pressure 967 mmHg  LVp Diastolic Pressure 17 mmHg  LVp EDP Pressure 32 mmHg    ECHOCARDIOGRAM REPORT         Patient Name:   HRISTOPHER MISSILDINE  Date of Exam: 04/10/2022  Medical Rec #:  [id number]  Height:       71.0 in  Accession #:    [id number] Weight:       220.6 lb  Date of Birth:  [birth year]   BSA:          2.198 m  Patient Age:    16 years   BP:           100/74 mmHg  Patient Gender: M          HR:           65 bpm.  Exam Location:  Inpatient   Procedure: 2D Echo, Cardiac Doppler and Color Doppler   Indications:    Acute  myocardial  infarction, unspecified I21.9     History:        Patient has no prior history of Echocardiogram  examinations.                  Cardiomyopathy, CAD; Risk Factors:Hypertension and  Dyslipidemia.     Sonographer:    Bernadene Person RDCS  Referring Phys: [id number] [place]     1. Left ventricular ejection fraction, by estimation, is 50 to 55%. Left  ventricular ejection fraction by 2D MOD biplane is 52.6 %. The left  ventricle has low normal function. The left ventricle demonstrates  regional wall motion abnormalities (see  scoring diagram/findings for description). The left ventricular internal  cavity size was mildly dilated. Left ventricular diastolic parameters are  consistent with Grade I diastolic dysfunction (impaired relaxation).  Elevated left ventricular  end-diastolic pressure. There is mild hypokinesis of the left ventricular,  basal-mid inferior wall and inferolateral wall.   2. Right ventricular systolic function is normal. The right ventricular  size is normal.   3. The mitral valve is grossly normal. Trivial mitral valve  regurgitation.   4. The aortic valve is calcified. There is moderate calcification of the  aortic valve. Aortic valve regurgitation is not visualized. Severe aortic  valve stenosis. Aortic valve area, by VTI measures 0.52 cm. Aortic valve  mean gradient measures 48.7  mmHg. Aortic valve Vmax measures 4.29 m/s. DI is 0.16   5. The inferior vena cava is normal in size with greater than 50%  respiratory variability, suggesting right atrial pressure of 3 mmHg.   Comparison(s): No prior Echocardiogram.   FINDINGS   Left Ventricle: Left ventricular ejection fraction, by estimation, is 50  to 55%. Left ventricular ejection fraction by 2D MOD biplane is 52.6 %.  The left ventricle has low normal function. The left ventricle  demonstrates regional wall motion  abnormalities. Mild hypokinesis of the left ventricular, basal-mid   inferior wall and inferolateral wall. The left ventricular internal cavity  size was mildly dilated. There is no left ventricular hypertrophy. Left  ventricular diastolic parameters are  consistent with Grade I diastolic dysfunction (impaired relaxation).  Elevated left ventricular end-diastolic pressure.      LV Wall Scoring:  The antero-lateral wall and posterior wall are hypokinetic. The inferior  wall  is normal.   Right Ventricle: The right ventricular size is normal. No increase in  right ventricular wall thickness. Right ventricular systolic function is  normal.   Left Atrium: Left atrial size was normal in size.   Right Atrium: Right atrial size was normal in size.   Pericardium: There is no evidence of pericardial effusion.   Mitral Valve: The mitral valve is grossly normal. Trivial mitral valve  regurgitation.   Tricuspid Valve: The tricuspid valve is grossly normal. Tricuspid valve  regurgitation is trivial.   Aortic Valve: The aortic valve is calcified. There is moderate  calcification of the aortic valve. Aortic valve regurgitation is not  visualized. Severe aortic stenosis is present. Aortic valve mean gradient  measures 48.7 mmHg. Aortic valve peak gradient  measures 73.7 mmHg. Aortic valve area, by VTI measures 0.52 cm.   Pulmonic Valve: The pulmonic valve was normal in structure. Pulmonic valve  regurgitation is not visualized.   Aorta: The aortic root and ascending aorta are structurally normal, with  no evidence of dilitation.   Venous: The inferior vena cava is normal in size with greater than 50%  respiratory variability,  suggesting right atrial pressure of 3 mmHg.   IAS/Shunts: No atrial level shunt detected by color flow Doppler.      LEFT VENTRICLE  PLAX 2D                        Biplane EF (MOD)  LVIDd:         5.90 cm         LV Biplane EF:   Left  LVIDs:         4.10 cm                          ventricular  LV PW:         1.10 cm                           ejection  LV IVS:        1.00 cm                          fraction by  LVOT diam:     2.00 cm                          2D MOD  LV SV:         62                               biplane is  LV SV Index:   28                               52.6 %.  LVOT Area:     3.14 cm                                 Diastology                                 LV e' medial:    5.03 cm/s  LV Volumes (MOD)               LV E/e' medial:  16.3  LV vol d, MOD    125.0 ml      LV e' lateral:   7.54 cm/s  A2C:                           LV E/e' lateral: 10.8  LV vol d, MOD    121.0 ml  A4C:  LV vol s, MOD    56.3 ml  A2C:  LV vol s, MOD    65.9 ml  A4C:  LV SV MOD A2C:   68.7 ml  LV SV MOD A4C:   121.0 ml  LV SV MOD BP:    69.3 ml   RIGHT VENTRICLE  RV S prime:     11.30 cm/s  TAPSE (M-mode): 2.2 cm   LEFT ATRIUM             Index        RIGHT ATRIUM           Index  LA diam:  3.10 cm 1.41 cm/m   RA Area:     13.70 cm  LA Vol (A2C):   51.7 ml 23.52 ml/m  RA Volume:   34.40 ml  15.65 ml/m  LA Vol (A4C):   47.2 ml 21.47 ml/m  LA Biplane Vol: 51.9 ml 23.61 ml/m   AORTIC VALVE  AV Area (Vmax):    0.65 cm  AV Area (Vmean):   0.55 cm  AV Area (VTI):     0.52 cm  AV Vmax:           429.33 cm/s  AV Vmean:          331.000 cm/s  AV VTI:            1.23 m  AV Peak Grad:      73.7 mmHg  AV Mean Grad:      48.7 mmHg  LVOT Vmax:         88.30 cm/s  LVOT Vmean:        58.300 cm/s  LVOT VTI:          0.198 m  LVOT/AV VTI ratio: 0.16     AORTA  Ao Root diam: 3.10 cm  Ao Asc diam:  3.50 cm   MITRAL VALVE  MV Area (PHT): 4.89 cm    SHUNTS  MV Decel Time: 155 msec    Systemic VTI:  0.20 m  MV E velocity: 81.80 cm/s  Systemic Diam: 2.00 cm  MV A velocity: 79.70 cm/s  MV E/A ratio:  1.03   Lyman Bishop MD  Electronically signed by Lyman Bishop MD  Signature Date/Time: 04/10/2022/12:08:40 PM         Final    Narrative & Impression  CLINICAL DATA:  Preop evaluation for  aortic valve replacement   EXAM: CTA ABDOMEN AND PELVIS WITHOUT AND WITH CONTRAST   TECHNIQUE: Multidetector CT imaging of the abdomen and pelvis was performed using the standard protocol during bolus administration of intravenous contrast. Multiplanar reconstructed images and MIPs were obtained and reviewed to evaluate the vascular anatomy.   RADIATION DOSE REDUCTION: This exam was performed according to the departmental dose-optimization program which includes automated exposure control, adjustment of the mA and/or kV according to patient size and/or use of iterative reconstruction technique.   CONTRAST:  19m OMNIPAQUE IOHEXOL 350 MG/ML SOLN   COMPARISON:  CT abdomen and pelvis dated October 03, 2016   FINDINGS: CTA CHEST FINDINGS   Cardiovascular: Normal heart size. No pericardial effusion. Aortic valve thickening and calcifications. Normal caliber thoracic aorta with mild atherosclerotic disease. Standard three-vessel aortic arch with no significant stenosis. No suspicious central filling defects of the pulmonary arteries.   Mediastinum/Nodes: Esophagus and thyroid are unremarkable. Calcified mediastinal and right hilar lymph nodes, likely sequela of prior granulomatous infection. No pathologically enlarged lymph nodes seen in the chest.   Lungs/Pleura: Central airways are patent. Mild centrilobular emphysema. Smooth right pleural thickening. Linear opacities of the right lung base, likely due to scarring.   Musculoskeletal: No chest wall abnormality. No acute or significant osseous findings.   CTA ABDOMEN AND PELVIS FINDINGS   Hepatobiliary: Unchanged linear calcification of the hepatic lobe, possibly due to prior intervention granulomatous disease. Gallstone with no gallbladder wall thickening. No biliary ductal dilation.   Pancreas: Unremarkable. No pancreatic ductal dilatation or surrounding inflammatory changes.   Spleen: Normal in size without focal  abnormality.   Adrenals/Urinary Tract: Bilateral adrenal glands are unremarkable. Severely atrophic right kidney. No hydronephrosis. Scattered low-attenuation lesions of  the left kidney, largest is compatible with a simple cysts, others are too small to completely characterize, no further follow-up imaging is recommended.   Stomach/Bowel: Stomach is within normal limits. Prior partial resection of the sigmoid colon. No evidence of bowel wall thickening, distention, or inflammatory changes.   Vascular/lymphatic: Calcified abdominal lymph nodes of the gastrohepatic ligament, unchanged when compared with prior exam. No enlarged noncalcified lymph nodes seen in the abdomen or pelvis. Severe calcified and noncalcified plaque of the abdominal aorta. Dilated infrarenal abdominal aorta, measuring up to 3.1 cm. 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   Reproductive: Prostatomegaly, measuring up to 4.8 cm.   Other: Stable linear calcified structure of the right lower quadrant seen on series 4, image 197. large amount of rectus diastasis, similar to prior. Prior ventral abdominal wall hernia repair with small defect of the left lower quadrant seen on series 4, image 191, unchanged when compared prior.   Musculoskeletal: No acute or significant osseous findings.   VASCULAR MEASUREMENTS PERTINENT TO TAVR:   AORTA:   Minimal Aortic Diameter -  16.5 mm   Severity of Aortic Calcification-moderate   RIGHT PELVIS:   Right Common Iliac Artery -   Minimal Diameter-8.1 mm   Tortuosity-severe   Calcification-moderate   Right External Iliac Artery -   Minimal Diameter-7.9 mm   Tortuosity-mild   Calcification-mild   Right Common Femoral Artery -   Minimal Diameter-6.1 mm   Tortuosity-mild   Calcification-mild   LEFT PELVIS:   Left  Common Iliac Artery -   Minimal Diameter-8.4 mm   Tortuosity-moderate   Calcification-moderate   Left External Iliac Artery -   Minimal Diameter-7.1 mm   Tortuosity-none   Calcification-mild   Left Common Femoral Artery -   Minimal Diameter-6.8 mm   Tortuosity-none   Calcification-mild   Review of the MIP images confirms the above findings.   IMPRESSION: 1. Vascular findings and measurements pertinent to potential TAVR procedure, as detailed above. 2. Thickening and calcification of the aortic valve, compatible with reported clinical history of aortic stenosis. 3. 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 Left main and 3 vessel coronary artery disease. 4. Infrarenal abdominal aortic aneurysm measuring up to 3.1 cm. Recommend follow-up ultrasound every 3 years. This recommendation follows ACR consensus guidelines: White Paper of the ACR Incidental Findings Committee II on Vascular Findings. J Am Coll Radiol 2013; 10:789-794. 5. 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     Electronically Signed   By: Yetta Glassman M.D.   On: 04/10/2022 15:41      Impression:  This 56 year old gentleman presents with stage D, severe, symptomatic aortic stenosis with NYHA class IV symptoms of chest pain and shortness of breath at rest and ruled in for non-STEMI.  I have personally reviewed his 2D echocardiogram, cardiac catheterization, and CTA studies.  His echocardiogram shows a heavily calcified aortic valve with restricted leaflet mobility.  The mean gradient was 49 mmHg with a valve area of 0.52 cm consistent with severe aortic stenosis.  Cardiac catheterization showed mild nonobstructive coronary disease.  The mean gradient across the aortic valve was 52 mmHg with a peak gradient of 64 mmHg.  His gated cardiac CTA has not been officially read but appears to show a heavily calcified bicuspid aortic valve.  His abdominal  and pelvic CTA shows severe diffuse aortoiliac atherosclerosis  with a small infrarenal abdominal aortic aneurysm with a large amount of laminated thrombus within it.  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  I agree that aortic valve replacement is indicated in this patient for relief of his symptoms and to prevent further left ventricular dysfunction and death.  Given his age of 28 he would normally be considered best treated with surgical aortic valve replacement.  His situation is more complicated due to multiple comorbidities including a massive abdominal wall hernia extending up to the xiphoid process and costal margins which would make the risk of sternotomy prohibitive.  He has some bowel extending up over the tip of the xiphoid process.  He could be considered for aortic valve replacement through a partial sternotomy or right anterior thoracotomy although I think that would result in difficult exposure and he may have obliteration of his pericardial space due to his prior surgeries for colon cancer and subsequent complications with abdominal wall hernias, mesh, and infection.  I think it is very likely that his pericardial space was entered which would result in adhesions.  An alternative incision would also not negate the following comorbidities including a 40+ year history of heavy smoking and continued smoking of 1-1/2 to 2 packs of cigarettes per day on multiple inhalers and as needed prednisone suggesting significant COPD.  He would benefit from having PFTs done.  He only has 1 functioning kidney.  He has not seen a dentist in years and obviously has poor dentition and will need dental evaluation.  I would recommend getting pulmonary function testing done as well as dental evaluation.  We will await the final reading of his gated cardiac CTA to see if TAVR is even an option.  Then we can have a  multidisciplinary discussion concerning the pros and cons of both approaches.  I discussed all this with the patient and his wife and answered all of their questions.  Plan:  He should have dental evaluation and pulmonary function testing with diffusion capacity while awaiting final reading of his gated cardiac CTA.   Gaye Pollack, MD 04/10/2022 7:58 PM

## 2022-04-10 NOTE — Consult Note (Signed)
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  The patient reports feeling well until yesterday morning when he developed severe substernal chest tightness about the time he arrived at work.  He was not doing any strenuous activity.  This persisted and was associated with shortness of breath and diaphoresis.  He presented to the emergency room at [facility].  Electrocardiogram showed nonspecific inferolateral ST-T wave changes suggesting ischemia.  Initial troponin was 489 and increased to 1146.  He was treated with nitroglycerin, morphine, and IV nitroglycerin with heparin with improvement in his pain but it persisted and he was transferred to [facility] for further evaluation.  Cardiac catheterization yesterday showed mild diffuse disease throughout the LAD.  There is moderate diffuse disease throughout the right coronary artery.  LVEDP was elevated at 29.  The aortic valve had a mean gradient of 52 mmHg with a peak to peak gradient of 62 mmHg consistent with severe aortic stenosis.  He had an echocardiogram today showing a calcified and thickened aortic valve with a mean gradient of 49 mmHg and a peak gradient of 74 mmHg with a valve area of 0.52 cm by VTI.  Left  ventricular ejection fraction was 50 to 55%.  He had a gated cardiac CTA today which has not been officially read but appears to show a heavily calcified bicuspid aortic valve.  His abdominal and pelvic CTA shows severe diffuse aortoiliac atherosclerosis with calcified and noncalcified plaque.  There is a 3.1 cm infrarenal abdominal aortic aneurysm.  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  He denies any prior chest pain or pressure.  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  He denies any dizziness or syncope.  Has had no orthopnea.  Denies peripheral edema.  He does use inhalers for wheezing frequently.  The patient underwent multiple surgeries related to his metastatic colon cancer and abdominal wall hernias.  He had a colon resection with colostomy as well as partial liver resection.  He was treated with chemotherapy.  He subsequently developed abdominal wall infection and recurrent abdominal wall hernias and had to have multiple surgeries, use of mesh, wound vacs, and eventually healed but his abdominal wall has basically been a large hernia.  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  He has noted chronic problems with his teeth that he relates to chemotherapy.  He does not see a dentist regularly. Past Medical History:  Diagnosis Date   CAD (coronary  artery disease)    Nonobstructive by cardiac catheterization 2021 [facility]   Cardiomyopathy [facility])    Colon cancer ([place])    Metastatic to liver and lymph nodes status post surgery and chemotherapy 2005   Erectile dysfunction    Essential hypertension    History of pneumonia    Incisional hernia    Mixed hyperlipidemia     Past Surgical History:  Procedure Laterality Date   COLON SURGERY     COLOSTOMY     COLOSTOMY  REVERSAL     HERNIA REPAIR     LEFT HEART CATH AND CORONARY ANGIOGRAPHY N/A 04/09/2022   Procedure: LEFT HEART CATH AND CORONARY ANGIOGRAPHY;  Surgeon: Early Osmond, MD;  Location: [place] CV LAB;  Service: Cardiovascular;  Laterality: N/A;   LIVER RESECTION      Family History  Problem Relation Age of Onset   AAA (abdominal aortic aneurysm) Mother    Cancer Father     Social History   Socioeconomic History   Marital status: Married    Spouse name: Not on file   Number of children: Not on file   Years of education: Not on file   Highest education level: Not on file  Occupational History   Not on file  Tobacco Use   Smoking status: Every Day    Packs/day: 2.00    Types: Cigarettes   Smokeless tobacco: Never  Substance and Sexual Activity   Alcohol use: Yes    Comment: Occasional   Drug use: No   Sexual activity: Not on file  Other Topics Concern   Not on file  Social History Narrative   Not on file   Social Determinants of Health   Financial Resource Strain: Not on file  Food Insecurity: Not on file  Transportation Needs: Not on file  Physical Activity: Not on file  Stress: Not on file  Social Connections: Not on file  Intimate Partner Violence: Not on file    Prior to Admission medications   Medication Sig Start Date End Date Taking? Authorizing Provider  albuterol (PROVENTIL) (2.5 MG/3ML) 0.083% nebulizer solution Take 3 mLs (2.5 mg total) by nebulization every 6 (six) hours as needed for wheezing or shortness of breath. Patient taking differently: Take 2.5 mg by nebulization as needed for wheezing or shortness of breath. 10/03/17  Yes Triplett, Tammy, PA-C  albuterol (VENTOLIN HFA) 108 (90 Base) MCG/ACT inhaler Inhale 2 puffs into the lungs every 4 (four) hours as needed for wheezing or shortness of breath. Patient taking differently: Inhale 2 puffs into the lungs daily as needed for wheezing or shortness of breath. 03/29/21  Yes Noemi Chapel, MD  aspirin  EC 81 MG tablet Take 81 mg by mouth every other day.   Yes [provider]  aspirin-acetaminophen-caffeine (EXCEDRIN MIGRAINE) [phone] MG tablet Take 2 tablets by mouth daily as needed for headache or migraine.   Yes [provider]  atorvastatin (LIPITOR) 40 MG tablet Take 40 mg by mouth daily.   Yes [provider]  cholecalciferol (VITAMIN D3) 25 MCG (1000 UNIT) tablet Take 1,000 Units by mouth daily.   Yes [provider]  Cyanocobalamin (B-12 PO) Take 1 capsule by mouth daily.   Yes [provider]  Fluticasone-Umeclidin-Vilant (TRELEGY ELLIPTA) 100-62.5-25 MCG/ACT AEPB Inhale 1 puff into the lungs daily.   Yes [provider]  ibuprofen (ADVIL) 800 MG tablet Take 800 mg by mouth daily as needed for headache.   Yes [provider]  lisinopril (ZESTRIL) 10 MG tablet Take 10 mg by mouth at bedtime.   Yes [provider]  naproxen (NAPROSYN) 500 MG tablet Take 1 tablet (500 mg total) by mouth 2 (two) times daily with a meal. 03/25/22  Yes Sanjuana Kava, MD  Omega-3 Fatty Acids (FISH OIL PO) Take 1 capsule by mouth daily.   Yes [provider]  senna-docusate (SENNA S) 8.6-50 MG tablet Take 1-2 tablets by mouth See admin instructions. Take 2 tablets in the morning and 1 tablet at night   Yes [provider]  traMADol (ULTRAM) 50 MG tablet Take 1 tablet (50 mg total) by mouth every 6 (six) hours as needed. Patient taking differently: Take 50 mg by mouth daily as needed for moderate pain. 11/19/21  Yes Jaynee Eagles, PA-C  amoxicillin-clavulanate (AUGMENTIN) 875-125 MG tablet 1 tablet Orally every 12 hrs for 10 day(s) Patient not taking: Reported on 04/10/2022 04/02/22   [provider]  atorvastatin (LIPITOR) 80 MG tablet Take 80 mg by mouth daily at 6 PM.  Patient not taking: Reported on 04/10/2022 09/28/17   [provider]  cyclobenzaprine (FLEXERIL) 10 MG tablet Take 1 tablet (10 mg total) by  mouth 2 (two) times daily as needed for muscle spasms. Do not drink alcohol or drive while taking this medication.  May cause drowsiness. Patient not taking: Reported on 04/10/2022 03/18/22   Volney American, PA-C  predniSONE (DELTASONE) 20 MG tablet Take 2 tablets (40 mg total) by mouth daily. Patient not taking: Reported on 04/10/2022 03/29/21   Noemi Chapel, MD    Current Facility-Administered Medications  Medication Dose Route Frequency Provider Last Rate Last Admin   0.9 %  sodium chloride infusion  250 mL Intravenous PRN Ahmed Prima, Marye Round M, PA-C       0.9 %  sodium chloride infusion  250 mL Intravenous PRN Early Osmond, MD       acetaminophen (TYLENOL) tablet 650 mg  650 mg Oral Q4H PRN Ahmed Prima, Brittany M, PA-C       acetaminophen (TYLENOL) tablet 650 mg  650 mg Oral Q4H PRN Early Osmond, MD       albuterol (PROVENTIL) (2.5 MG/3ML) 0.083% nebulizer solution 2.5 mg  2.5 mg Nebulization Q6H PRN Strader, Brittany M, PA-C       aspirin EC tablet 81 mg  81 mg Oral Daily Bernerd Pho M, PA-C   81 mg at 04/10/22 0857   atorvastatin (LIPITOR) tablet 80 mg  80 mg Oral [id number] Erma Heritage, PA-C   80 mg at 04/10/22 1644   enoxaparin (LOVENOX) injection 40 mg  40 mg Subcutaneous Daily Reino Bellis B, NP   40 mg at 04/10/22 1001   nicotine (NICODERM CQ - dosed in mg/24 hours) patch 21 mg  21 mg Transdermal Daily Ledora Bottcher, PA   21 mg at 04/10/22 1747   nitroGLYCERIN (NITROSTAT) SL tablet 0.4 mg  0.4 mg Sublingual Q5 min PRN Bernerd Pho M, PA-C   0.4 mg at 04/09/22 1451   ondansetron (ZOFRAN) injection 4 mg  4 mg Intravenous Q6H PRN Ahmed Prima, [place] M, PA-C       ondansetron [facility]) injection 4 mg  4 mg Intravenous Q6H PRN Early Osmond, MD       sodium chloride flush (NS) 0.9 % injection 3 mL  3 mL Intravenous Q12H Strader, [place] M, PA-C       sodium chloride flush (NS) 0.9 % injection 3 mL  3 mL Intravenous PRN  Ahmed Prima, [place] M, PA-C       sodium  chloride flush (NS) 0.9 % injection 3 mL  3 mL Intravenous Q12H Early Osmond, MD   3 mL at 04/10/22 1002   sodium chloride flush (NS) 0.9 % injection 3 mL  3 mL Intravenous PRN Early Osmond, MD       traMADol Veatrice Bourbon) tablet 50 mg  50 mg Oral Q6H PRN Ahmed Prima, [place] M, PA-C        Allergies  Allergen Reactions   Tiotropium Bromide Monohydrate Anaphylaxis      Review of Systems:   General:  normal appetite, normal energy, no weight gain, no weight loss, no fever  Cardiac:  + chest pain with exertion, + chest pain at rest, no SOB with  exertion, no resting SOB, no PND, no orthopnea, no palpitations, no arrhythmia, no atrial fibrillation, no LE edema, no dizzy spells, no syncope  Respiratory:  no shortness of breath, no home oxygen, no productive cough, no dry cough, no bronchitis, + wheezing, no hemoptysis, no asthma, no pain with inspiration or cough, no sleep apnea, no CPAP at night  GI:   no difficulty swallowing, no reflux, no frequent heartburn, no hiatal hernia, no abdominal pain, no constipation, no diarrhea, no hematochezia, no hematemesis, no melena  GU:   no dysuria,  no frequency, no urinary tract infection, no hematuria, no enlarged prostate, no kidney stones, + kidney disease  Vascular:  no pain suggestive of claudication, no pain in feet, no leg cramps, no varicose veins, no DVT, no non-healing foot ulcer  Neuro:   no stroke, no TIA's, no seizures, no headaches, no temporary blindness one eye,  no slurred speech, no peripheral neuropathy, no chronic pain, no instability of gait, no memory/cognitive dysfunction  Musculoskeletal: no arthritis, no joint swelling, no myalgias, no difficulty walking, normal mobility   Skin:   no rash, no itching, no skin infections, no pressure sores or ulcerations  Psych:   no anxiety, no depression, no nervousness, no unusual recent stress  Eyes:   no blurry vision, no floaters, no recent vision changes, no glasses or contacts  ENT:   no  hearing loss, no loose or painful teeth, no dentures, last saw dentist many years ago  Hematologic:  no easy bruising, no abnormal bleeding, no clotting disorder, no frequent epistaxis  Endocrine:  no diabetes, does not check CBG's at home     Physical Exam:   BP 115/83 (BP Location: Left Arm)   Pulse 74   Temp 98.1 F (36.7 C) (Oral)   Resp 16   Ht '5\' 11"'$  (1.803 m)   Wt 100.1 kg   SpO2 94%   BMI 30.77 kg/m   General:  well-appearing  HEENT:  Unremarkable, NCAT, PERLA, EOMI, poor dentition  Neck:   no JVD, no bruits, no adenopathy   Chest:   clear to auscultation, symmetrical breath sounds, no wheezes, no rhonchi   CV:   RRR, 3/6 systolic murmur RSB, no diastolic murmur  Abdomen:  soft, non-tender, no masses, very large ventral hernia extending from the xyphoid process to lower abdomen.  Extremities:  warm, well-perfused, pulses palpable at ankles, no lower extremity edema  Rectal/GU  Deferred  Neuro:   Grossly non-focal and symmetrical throughout  Skin:   Clean and dry, no rashes, no breakdown  Diagnostic Tests:  Lab Results: Recent Labs    04/09/22 1424 04/10/22 0142  WBC 11.4* 10.5  HGB 16.4 15.1  HCT 49.3 44.7  PLT 117* 101*   BMET:  Recent Labs    04/09/22 1424 04/10/22 0142  NA 140 139  K 4.3 4.2  CL 110 111  CO2 25 24  GLUCOSE 82 106*  BUN 17 18  CREATININE 1.12 1.21  CALCIUM 9.1 8.2*    Physicians  Panel Physicians Referring Physician Case Authorizing Physician  Early Osmond, MD (Primary)     Procedures  LEFT HEART CATH AND CORONARY ANGIOGRAPHY   Conclusion  1.  Mild obstructive coronary artery disease. 2.  Severely elevated LVEDP of 29 mmHg. 3.  Heavily calcified aortic valve with a mean gradient of 52 mmHg and a peak to peak gradient of 62 mmHg consistent with severe aortic stenosis; aortic waveforms demonstrated parvus and tardus. 4.  Preserved left ventricular ejection fraction.   Recommendation: Echocardiogram to evaluate for  severe aortic stenosis which will inform therapy moving forward.   Indications  Acute coronary syndrome (HCC) [I24.9 (ICD-10-CM)]   Clinical Presentation  CHF/Shock Congestive heart failure present. NYHA Class III. No shock present.   Procedural Details  Technical Details The patient is a 56 year old male with a history of nonobstructive coronary artery disease on cardiac catheterization in 2021, metastatic colon cancer status postsurgery and chemotherapy in remission and ongoing tobacco abuse who developed acute onset chest pain earlier today.  He was found to have developed an acute coronary syndrome.  He was unable to be rendered chest pain-free with medications.  For this reason he was referred for an urgent invasive assessment due to the indication of acute coronary syndrome refractory to medical therapy.  After obtaining consent the patient was brought to the cardiac catheterization laboratory and prepped and draped in a sterile fashion.  Xylocaine was used to anesthetize the right wrist and 6 French Terumo glide sheath placed there.  5000 units heparin and 5 mg of verapamil were administered through the sheath.  Coronary angiography was done with a JR4 diagnostic catheter and EBU 3 5 guide.  We noticed that the patient's arterial waveform demonstrated parvus and tardus.  Additionally we noticed a very large jet from the aortic valve on catheter manipulation and that the aortic valve was severely calcified.  After ruling out obstructive coronary artery disease we elected to cross the valve.  A left ventricular end-diastolic pressure was measured and found to be 29 mmHg.  Ventriculography in the RAO projection demonstrated a normal ejection fraction with no significant wall motion abnormalities and no evidence of Takotsubo cardiomyopathy.  The peak to peak gradient across the aortic valve was 62 mmHg and the mean gradient was 52 mmHg consistent with severe aortic stenosis.  At the conclusion of  the procedure the patient was comfortable and chest pain-free.  A TR band was placed.  There are no acute complications. Estimated blood loss <50 mL.   During this procedure medications were administered to achieve and maintain moderate conscious sedation while the patient's heart rate, blood pressure, and oxygen saturation were continuously monitored and I was present face-to-face 100% of this time.   Medications (Filter: Administrations occurring from 1741 to 1926 on 04/09/22)  important  Continuous medications are totaled by the amount administered until 04/09/22 1926.   aspirin chewable tablet (mg) Total dose:  324 mg  Date/Time Rate/Dose/Volume Action   04/09/22 1755 324 mg Given    0.9 %  sodium chloride infusion (mL/hr) Total dose:  Cannot be calculated*  *Continuous medication not stopped within the calculation time range. Date/Time Rate/Dose/Volume Action   04/09/22  1756 10 mL/hr New Bag/Given    midazolam (VERSED) injection (mg) Total dose:  1 mg  Date/Time Rate/Dose/Volume Action   04/09/22 1801 1 mg Given    fentaNYL (SUBLIMAZE) injection (mcg) Total dose:  25 mcg  Date/Time Rate/Dose/Volume Action   04/09/22 1801 25 mcg Given    lidocaine (PF) (XYLOCAINE) 1 % injection (mL) Total volume:  2 mL  Date/Time Rate/Dose/Volume Action   04/09/22 1808 2 mL Given    Radial Cocktail/Verapamil only (mL) Total volume:  10 mL  Date/Time Rate/Dose/Volume Action   04/09/22 1809 10 mL Given    heparin sodium (porcine) injection (Units) Total dose:  5,000 Units  Date/Time Rate/Dose/Volume Action   04/09/22 1809 5,000 Units Given    iohexol (OMNIPAQUE) 350 MG/ML injection (mL) Total volume:  105 mL  Date/Time Rate/Dose/Volume Action   04/09/22 1853 105 mL Given    Heparin (Porcine) in NaCl 1000-0.9 UT/500ML-% SOLN (mL) Total volume:  1,000 mL  Date/Time Rate/Dose/Volume Action   04/09/22 1838 500 mL Given   1838 500 mL Given    Sedation Time  Sedation  Time Physician-1: 50 minutes 8 seconds Contrast  Medication Name Total Dose  iohexol (OMNIPAQUE) 350 MG/ML injection 105 mL   Radiation/Fluoro  Fluoro time: 7.3, 7.3 (min) DAP: 28003 (mGycm2) Cumulative Air Kerma: 409, 811 (mGy) Complications  Complications documented before study signed (04/09/2022  9:14 PM)   No complications were associated with this study.  Documented by Early Osmond, MD - 04/09/2022  7:04 PM     Coronary Findings  Diagnostic Dominance: Right Left Anterior Descending  There is mild diffuse disease throughout the vessel.    Right Coronary Artery  There is moderate diffuse disease throughout the vessel.    Intervention   No interventions have been documented.   Coronary Diagrams  Diagnostic Dominance: Right  Intervention  Implants     No implant documentation for this case.   Syngo Images   Show images for CARDIAC CATHETERIZATION Images on Long Term Storage   Show images for Satoru, Milich to Procedure Log  Procedure Log    Hemo Data  Flowsheet Row Most Recent Value  Aortic Mean Gradient 52.46 mmHg  Aortic Peak Gradient 78.2 mmHg  AO Systolic Pressure 956 mmHg  AO Diastolic Pressure 78 mmHg  AO Mean 93 mmHg  LV Systolic Pressure 213 mmHg  LV Diastolic Pressure 15 mmHg  LV EDP 29 mmHg  AOp Systolic Pressure 086 mmHg  AOp Diastolic Pressure 71 mmHg  AOp Mean Pressure 88 mmHg  LVp Systolic Pressure 578 mmHg  LVp Diastolic Pressure 17 mmHg  LVp EDP Pressure 32 mmHg    ECHOCARDIOGRAM REPORT         Patient Name:   NIHAAL FRIESEN  Date of Exam: 04/10/2022  Medical Rec #:  [id number]  Height:       71.0 in  Accession #:    [id number] Weight:       220.6 lb  Date of Birth:  [birth year]   BSA:          2.198 m  Patient Age:    89 years   BP:           100/74 mmHg  Patient Gender: M          HR:           65 bpm.  Exam Location:  Inpatient   Procedure: 2D Echo, Cardiac Doppler and Color Doppler   Indications:    Acute  myocardial  infarction, unspecified I21.9     History:        Patient has no prior history of Echocardiogram  examinations.                  Cardiomyopathy, CAD; Risk Factors:Hypertension and  Dyslipidemia.     Sonographer:    Bernadene Person RDCS  Referring Phys: [id number] [place]     1. Left ventricular ejection fraction, by estimation, is 50 to 55%. Left  ventricular ejection fraction by 2D MOD biplane is 52.6 %. The left  ventricle has low normal function. The left ventricle demonstrates  regional wall motion abnormalities (see  scoring diagram/findings for description). The left ventricular internal  cavity size was mildly dilated. Left ventricular diastolic parameters are  consistent with Grade I diastolic dysfunction (impaired relaxation).  Elevated left ventricular  end-diastolic pressure. There is mild hypokinesis of the left ventricular,  basal-mid inferior wall and inferolateral wall.   2. Right ventricular systolic function is normal. The right ventricular  size is normal.   3. The mitral valve is grossly normal. Trivial mitral valve  regurgitation.   4. The aortic valve is calcified. There is moderate calcification of the  aortic valve. Aortic valve regurgitation is not visualized. Severe aortic  valve stenosis. Aortic valve area, by VTI measures 0.52 cm. Aortic valve  mean gradient measures 48.7  mmHg. Aortic valve Vmax measures 4.29 m/s. DI is 0.16   5. The inferior vena cava is normal in size with greater than 50%  respiratory variability, suggesting right atrial pressure of 3 mmHg.   Comparison(s): No prior Echocardiogram.   FINDINGS   Left Ventricle: Left ventricular ejection fraction, by estimation, is 50  to 55%. Left ventricular ejection fraction by 2D MOD biplane is 52.6 %.  The left ventricle has low normal function. The left ventricle  demonstrates regional wall motion  abnormalities. Mild hypokinesis of the left ventricular, basal-mid   inferior wall and inferolateral wall. The left ventricular internal cavity  size was mildly dilated. There is no left ventricular hypertrophy. Left  ventricular diastolic parameters are  consistent with Grade I diastolic dysfunction (impaired relaxation).  Elevated left ventricular end-diastolic pressure.      LV Wall Scoring:  The antero-lateral wall and posterior wall are hypokinetic. The inferior  wall  is normal.   Right Ventricle: The right ventricular size is normal. No increase in  right ventricular wall thickness. Right ventricular systolic function is  normal.   Left Atrium: Left atrial size was normal in size.   Right Atrium: Right atrial size was normal in size.   Pericardium: There is no evidence of pericardial effusion.   Mitral Valve: The mitral valve is grossly normal. Trivial mitral valve  regurgitation.   Tricuspid Valve: The tricuspid valve is grossly normal. Tricuspid valve  regurgitation is trivial.   Aortic Valve: The aortic valve is calcified. There is moderate  calcification of the aortic valve. Aortic valve regurgitation is not  visualized. Severe aortic stenosis is present. Aortic valve mean gradient  measures 48.7 mmHg. Aortic valve peak gradient  measures 73.7 mmHg. Aortic valve area, by VTI measures 0.52 cm.   Pulmonic Valve: The pulmonic valve was normal in structure. Pulmonic valve  regurgitation is not visualized.   Aorta: The aortic root and ascending aorta are structurally normal, with  no evidence of dilitation.   Venous: The inferior vena cava is normal in size with greater than 50%  respiratory variability,  suggesting right atrial pressure of 3 mmHg.   IAS/Shunts: No atrial level shunt detected by color flow Doppler.      LEFT VENTRICLE  PLAX 2D                        Biplane EF (MOD)  LVIDd:         5.90 cm         LV Biplane EF:   Left  LVIDs:         4.10 cm                          ventricular  LV PW:         1.10 cm                           ejection  LV IVS:        1.00 cm                          fraction by  LVOT diam:     2.00 cm                          2D MOD  LV SV:         62                               biplane is  LV SV Index:   28                               52.6 %.  LVOT Area:     3.14 cm                                 Diastology                                 LV e' medial:    5.03 cm/s  LV Volumes (MOD)               LV E/e' medial:  16.3  LV vol d, MOD    125.0 ml      LV e' lateral:   7.54 cm/s  A2C:                           LV E/e' lateral: 10.8  LV vol d, MOD    121.0 ml  A4C:  LV vol s, MOD    56.3 ml  A2C:  LV vol s, MOD    65.9 ml  A4C:  LV SV MOD A2C:   68.7 ml  LV SV MOD A4C:   121.0 ml  LV SV MOD BP:    69.3 ml   RIGHT VENTRICLE  RV S prime:     11.30 cm/s  TAPSE (M-mode): 2.2 cm   LEFT ATRIUM             Index        RIGHT ATRIUM           Index  LA diam:  3.10 cm 1.41 cm/m   RA Area:     13.70 cm  LA Vol (A2C):   51.7 ml 23.52 ml/m  RA Volume:   34.40 ml  15.65 ml/m  LA Vol (A4C):   47.2 ml 21.47 ml/m  LA Biplane Vol: 51.9 ml 23.61 ml/m   AORTIC VALVE  AV Area (Vmax):    0.65 cm  AV Area (Vmean):   0.55 cm  AV Area (VTI):     0.52 cm  AV Vmax:           429.33 cm/s  AV Vmean:          331.000 cm/s  AV VTI:            1.72 m  AV Peak Grad:      73.7 mmHg  AV Mean Grad:      48.7 mmHg  LVOT Vmax:         88.30 cm/s  LVOT Vmean:        58.300 cm/s  LVOT VTI:          0.198 m  LVOT/AV VTI ratio: 0.16     AORTA  Ao Root diam: 3.10 cm  Ao Asc diam:  3.50 cm   MITRAL VALVE  MV Area (PHT): 4.89 cm    SHUNTS  MV Decel Time: 155 msec    Systemic VTI:  0.20 m  MV E velocity: 81.80 cm/s  Systemic Diam: 2.00 cm  MV A velocity: 79.70 cm/s  MV E/A ratio:  1.03   Lyman Bishop MD  Electronically signed by Lyman Bishop MD  Signature Date/Time: 04/10/2022/12:08:40 PM         Final    Narrative & Impression  CLINICAL DATA:  Preop evaluation for  aortic valve replacement   EXAM: CTA ABDOMEN AND PELVIS WITHOUT AND WITH CONTRAST   TECHNIQUE: Multidetector CT imaging of the abdomen and pelvis was performed using the standard protocol during bolus administration of intravenous contrast. Multiplanar reconstructed images and MIPs were obtained and reviewed to evaluate the vascular anatomy.   RADIATION DOSE REDUCTION: This exam was performed according to the departmental dose-optimization program which includes automated exposure control, adjustment of the mA and/or kV according to patient size and/or use of iterative reconstruction technique.   CONTRAST:  47m OMNIPAQUE IOHEXOL 350 MG/ML SOLN   COMPARISON:  CT abdomen and pelvis dated October 03, 2016   FINDINGS: CTA CHEST FINDINGS   Cardiovascular: Normal heart size. No pericardial effusion. Aortic valve thickening and calcifications. Normal caliber thoracic aorta with mild atherosclerotic disease. Standard three-vessel aortic arch with no significant stenosis. No suspicious central filling defects of the pulmonary arteries.   Mediastinum/Nodes: Esophagus and thyroid are unremarkable. Calcified mediastinal and right hilar lymph nodes, likely sequela of prior granulomatous infection. No pathologically enlarged lymph nodes seen in the chest.   Lungs/Pleura: Central airways are patent. Mild centrilobular emphysema. Smooth right pleural thickening. Linear opacities of the right lung base, likely due to scarring.   Musculoskeletal: No chest wall abnormality. No acute or significant osseous findings.   CTA ABDOMEN AND PELVIS FINDINGS   Hepatobiliary: Unchanged linear calcification of the hepatic lobe, possibly due to prior intervention granulomatous disease. Gallstone with no gallbladder wall thickening. No biliary ductal dilation.   Pancreas: Unremarkable. No pancreatic ductal dilatation or surrounding inflammatory changes.   Spleen: Normal in size without focal  abnormality.   Adrenals/Urinary Tract: Bilateral adrenal glands are unremarkable. Severely atrophic right kidney. No hydronephrosis. Scattered low-attenuation lesions of  the left kidney, largest is compatible with a simple cysts, others are too small to completely characterize, no further follow-up imaging is recommended.   Stomach/Bowel: Stomach is within normal limits. Prior partial resection of the sigmoid colon. No evidence of bowel wall thickening, distention, or inflammatory changes.   Vascular/lymphatic: Calcified abdominal lymph nodes of the gastrohepatic ligament, unchanged when compared with prior exam. No enlarged noncalcified lymph nodes seen in the abdomen or pelvis. Severe calcified and noncalcified plaque of the abdominal aorta. Dilated infrarenal abdominal aorta, measuring up to 3.1 cm. 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   Reproductive: Prostatomegaly, measuring up to 4.8 cm.   Other: Stable linear calcified structure of the right lower quadrant seen on series 4, image 197. large amount of rectus diastasis, similar to prior. Prior ventral abdominal wall hernia repair with small defect of the left lower quadrant seen on series 4, image 191, unchanged when compared prior.   Musculoskeletal: No acute or significant osseous findings.   VASCULAR MEASUREMENTS PERTINENT TO TAVR:   AORTA:   Minimal Aortic Diameter -  16.5 mm   Severity of Aortic Calcification-moderate   RIGHT PELVIS:   Right Common Iliac Artery -   Minimal Diameter-8.1 mm   Tortuosity-severe   Calcification-moderate   Right External Iliac Artery -   Minimal Diameter-7.9 mm   Tortuosity-mild   Calcification-mild   Right Common Femoral Artery -   Minimal Diameter-6.1 mm   Tortuosity-mild   Calcification-mild   LEFT PELVIS:   Left  Common Iliac Artery -   Minimal Diameter-8.4 mm   Tortuosity-moderate   Calcification-moderate   Left External Iliac Artery -   Minimal Diameter-7.1 mm   Tortuosity-none   Calcification-mild   Left Common Femoral Artery -   Minimal Diameter-6.8 mm   Tortuosity-none   Calcification-mild   Review of the MIP images confirms the above findings.   IMPRESSION: 1. Vascular findings and measurements pertinent to potential TAVR procedure, as detailed above. 2. Thickening and calcification of the aortic valve, compatible with reported clinical history of aortic stenosis. 3. 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 Left main and 3 vessel coronary artery disease. 4. Infrarenal abdominal aortic aneurysm measuring up to 3.1 cm. Recommend follow-up ultrasound every 3 years. This recommendation follows ACR consensus guidelines: White Paper of the ACR Incidental Findings Committee II on Vascular Findings. J Am Coll Radiol 2013; 10:789-794. 5. 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     Electronically Signed   By: Yetta Glassman M.D.   On: 04/10/2022 15:41      Impression:  This 56 year old gentleman presents with stage D, severe, symptomatic aortic stenosis with NYHA class IV symptoms of chest pain and shortness of breath at rest and ruled in for non-STEMI.  I have personally reviewed his 2D echocardiogram, cardiac catheterization, and CTA studies.  His echocardiogram shows a heavily calcified aortic valve with restricted leaflet mobility.  The mean gradient was 49 mmHg with a valve area of 0.52 cm consistent with severe aortic stenosis.  Cardiac catheterization showed mild nonobstructive coronary disease.  The mean gradient across the aortic valve was 52 mmHg with a peak gradient of 64 mmHg.  His gated cardiac CTA has not been officially read but appears to show a heavily calcified bicuspid aortic valve.  His abdominal  and pelvic CTA shows severe diffuse aortoiliac atherosclerosis  with a small infrarenal abdominal aortic aneurysm with a large amount of laminated thrombus within it.  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  I agree that aortic valve replacement is indicated in this patient for relief of his symptoms and to prevent further left ventricular dysfunction and death.  Given his age of 49 he would normally be considered best treated with surgical aortic valve replacement.  His situation is more complicated due to multiple comorbidities including a massive abdominal wall hernia extending up to the xiphoid process and costal margins which would make the risk of sternotomy prohibitive.  He has some bowel extending up over the tip of the xiphoid process.  He could be considered for aortic valve replacement through a partial sternotomy or right anterior thoracotomy although I think that would result in difficult exposure and he may have obliteration of his pericardial space due to his prior surgeries for colon cancer and subsequent complications with abdominal wall hernias, mesh, and infection.  I think it is very likely that his pericardial space was entered which would result in adhesions.  An alternative incision would also not negate the following comorbidities including a 40+ year history of heavy smoking and continued smoking of 1-1/2 to 2 packs of cigarettes per day on multiple inhalers and as needed prednisone suggesting significant COPD.  He would benefit from having PFTs done.  He only has 1 functioning kidney.  He has not seen a dentist in years and obviously has poor dentition and will need dental evaluation.  I would recommend getting pulmonary function testing done as well as dental evaluation.  We will await the final reading of his gated cardiac CTA to see if TAVR is even an option.  Then we can have a  multidisciplinary discussion concerning the pros and cons of both approaches.  I discussed all this with the patient and his wife and answered all of their questions.  Plan:  He should have dental evaluation and pulmonary function testing with diffusion capacity while awaiting final reading of his gated cardiac CTA.   Gaye Pollack, MD 04/10/2022 7:58 PM

## 2022-04-10 NOTE — Progress Notes (Addendum)
Progress Note  Patient Name: Bob Ross Date of Encounter: 04/10/2022  [facility] Cardiologist: None   Subjective   Still with some mild chest discomfort.   Inpatient Medications    Scheduled Meds:  aspirin EC  81 mg Oral Daily   atorvastatin  80 mg Oral q1800   sodium chloride flush  3 mL Intravenous Q12H   sodium chloride flush  3 mL Intravenous Q12H   Continuous Infusions:  sodium chloride     sodium chloride     nitroGLYCERIN Stopped (04/09/22 1616)   PRN Meds: sodium chloride, sodium chloride, acetaminophen, acetaminophen, albuterol, nitroGLYCERIN, ondansetron (ZOFRAN) IV, ondansetron (ZOFRAN) IV, sodium chloride flush, sodium chloride flush, traMADol   Vital Signs    Vitals:   04/09/22 1854 04/09/22 2005 04/10/22 0041 04/10/22 0606  BP: 107/80 107/80 99/64 100/74  Pulse: 61 (!) 59 62 82  Resp: '16 12 14 15  '$ Temp:  97.7 F (36.5 C) 98 F (36.7 C) 98 F (36.7 C)  TempSrc:  Oral Oral Oral  SpO2: 96% 94% 91% 97%  Weight:    100.1 kg  Height:        Intake/Output Summary (Last 24 hours) at 04/10/2022 0840 Last data filed at 04/10/2022 0400 Gross per 24 hour  Intake 1169.21 ml  Output --  Net 1169.21 ml      04/10/2022    6:06 AM 04/09/2022    1:49 PM 03/25/2022    8:02 AM  Last 3 Weights  Weight (lbs) 220 lb 9.6 oz 222 lb 223 lb 2 oz  Weight (kg) 100.064 kg 100.699 kg 101.209 kg      Telemetry    Sinus Rhythm - Personally Reviewed  ECG    Sinus Rhythm, 62 bpm LVH - Personally Reviewed  Physical Exam   GEN: No acute distress.   Neck: No JVD Cardiac: RRR, no murmurs, rubs, or gallops.  Respiratory: Clear to auscultation bilaterally. GI: Soft, nontender, non-distended  MS: No edema; No deformity. Neuro:  Nonfocal  Psych: Normal affect   Labs    High Sensitivity Troponin:   Recent Labs  Lab 04/09/22 1424 04/09/22 1617  TROPONINIHS 489* 1,146*     Chemistry Recent Labs  Lab 04/09/22 1424 04/10/22 0142  NA 140 139  K 4.3 4.2   CL 110 111  CO2 25 24  GLUCOSE 82 106*  BUN 17 18  CREATININE 1.12 1.21  CALCIUM 9.1 8.2*  GFRNONAA >60 >60  ANIONGAP 5 4*    Lipids  Recent Labs  Lab 04/10/22 0142  CHOL 126  TRIG 309*  HDL 36*  LDLCALC 28  CHOLHDL 3.5    Hematology Recent Labs  Lab 04/09/22 1424 04/10/22 0142  WBC 11.4* 10.5  RBC 5.30 4.81  HGB 16.4 15.1  HCT 49.3 44.7  MCV 93.0 92.9  MCH 30.9 31.4  MCHC 33.3 33.8  RDW 14.8 14.9  PLT 117* 101*   Thyroid No results for input(s): "TSH", "FREET4" in the last 168 hours.  BNPNo results for input(s): "BNP", "PROBNP" in the last 168 hours.  DDimer No results for input(s): "DDIMER" in the last 168 hours.   Radiology    CARDIAC CATHETERIZATION  Result Date: 04/09/2022 1.  Mild obstructive coronary artery disease. 2.  Severely elevated LVEDP of 29 mmHg. 3.  Heavily calcified aortic valve with a mean gradient of 52 mmHg and a peak to peak gradient of 62 mmHg consistent with severe aortic stenosis; aortic waveforms demonstrated parvus and tardus. 4.  Preserved  left ventricular ejection fraction. Recommendation: Echocardiogram to evaluate for severe aortic stenosis which will inform therapy moving forward.   DG Chest Portable 1 View  Result Date: 04/09/2022 CLINICAL DATA:  Chest pain EXAM: PORTABLE CHEST 1 VIEW COMPARISON:  03/29/2021 FINDINGS: The heart size and mediastinal contours are within normal limits. Unchanged small, chronic right pleural effusion and/or pleural thickening with associated bandlike scarring or atelectasis of the right lung base. No acute appearing airspace opacity. The visualized skeletal structures are unremarkable. IMPRESSION: 1. Unchanged small, chronic right pleural effusion and/or pleural thickening with associated bandlike scarring or atelectasis of the right lung base. 2.  No acute appearing airspace opacity. Electronically Signed   By: Delanna Ahmadi M.D.   On: 04/09/2022 15:15    Cardiac Studies   Cath: 04/09/22  1.  Mild  obstructive coronary artery disease. 2.  Severely elevated LVEDP of 29 mmHg. 3.  Heavily calcified aortic valve with a mean gradient of 52 mmHg and a peak to peak gradient of 62 mmHg consistent with severe aortic stenosis; aortic waveforms demonstrated parvus and tardus. 4.  Preserved left ventricular ejection fraction.   Recommendation: Echocardiogram to evaluate for severe aortic stenosis which will inform therapy moving forward.   Diagnostic Dominance: Right   Patient Profile     55 y.o. male with a history of nonobstructive CAD documented by cardiac catheterization in 2021, cardiomyopathy with LVEF 40% as of 2021, metastatic colon cancer s/p surgery and chemotherapy back in 2005 in remission, hypertension, and hyperlipidemia who presented to [facility] with chest pain and found to have elevated troponins.  Assessment & Plan    NSTEMI Severe aortic stenosis -- cardiac cath noted above with mild non-obstructive disease. Was notable for severe aortic stenosis mean gradient of 52 mmHg and a peak to peak gradient of 62 mmHg consistent with severe aortic stenosis.  -- pending formal echo today -- LVEDP was 98mHg at cath, will give '40mg'$  IV x1 and follow as BPs have been soft  HLD: HDL 36, LDL 28, Trig 309 -- on atorvastatin '80mg'$  daily -- consider addition of vascepa  Tobacco use: cessation advised  Hx of metastatic Colon [place]: '05 [facility] [place] [facility] with residual abd incisional ventral hernia with mesh -- reports he has been in remission   For questions or updates, please contact CSweetwaterPlease consult [email or web address] for contact info under     Signed, LReino Bellis NP  04/10/2022, 8:40 AM    Patient seen, examined. Available data reviewed. Agree with findings, assessment, and plan as outlined by LReino Bellis NP.  The patient is independently interviewed and examined.  His wife is at the bedside.  HEENT is normal, JVP is normal, lungs are  clear bilaterally, heart is regular rate and rhythm with distant heart sounds and a 3/6 harsh crescendo decrescendo murmur at the right upper sternal border with absent A2, abdomen is soft with a large ventral hernia, extremities have no edema.  The patient presents with non-STEMI, troponin up to 1146.  Cardiac catheterization shows no obstructive CAD.  However, he is noted to have severe aortic stenosis as measured by cardiac cath with a mean gradient of 52 mmHg and peak gradient of 62 mmHg.  I have reviewed his preliminary echo images with the formal report currently pending.  His acoustic windows are difficult but LV function appears to be in the low normal range.  He has very severe aortic stenosis with a mean transvalvular gradient greater than 50 mmHg.  Aortic valve replacement is clearly indicated in this symptomatic patient who reports progressive symptoms of exertional dyspnea and chest discomfort over the past 6 months.  He now has experienced resting symptoms and non-STEMI.  He has elevated LV diastolic filling pressures measured at catheterization.  At his age, surgical aortic valve replacement is indicated.  However, decision making is complicated by his large ventral hernia with history of metastatic colon cancer.  He states that he has had about 30 surgeries and he has a large residual hernia that is not repairable.  Regarding his aortic stenosis, I suspect he has a bicuspid aortic valve, but acoustic windows are difficult and valve morphology is difficult to see on echo.  I am going to order a gated cardiac CTA and a CTA of the chest, abdomen, and pelvis.  We discussed potential treatment options for his severe aortic stenosis.  I suspect his treatment options would be conventional surgical AVR if sternotomy is technically feasible, minimally invasive aortic valve replacement via a lateral thoracotomy, or TAVR.  Will order CTA studies and consult cardiac surgery for further input.  Otherwise as  outlined above, will continue current medical therapy.  Sherren Mocha, M.D. 04/10/2022 12:07 PM

## 2022-04-11 ENCOUNTER — Inpatient Hospital Stay (HOSPITAL_COMMUNITY): Payer: BC Managed Care – PPO

## 2022-04-11 ENCOUNTER — Other Ambulatory Visit (HOSPITAL_COMMUNITY): Payer: Self-pay

## 2022-04-11 ENCOUNTER — Telehealth (HOSPITAL_COMMUNITY): Payer: Self-pay | Admitting: Pharmacy Technician

## 2022-04-11 DIAGNOSIS — I350 Nonrheumatic aortic (valve) stenosis: Secondary | ICD-10-CM

## 2022-04-11 DIAGNOSIS — I214 Non-ST elevation (NSTEMI) myocardial infarction: Secondary | ICD-10-CM | POA: Diagnosis not present

## 2022-04-11 LAB — BASIC METABOLIC PANEL
Anion gap: 8 (ref 5–15)
BUN: 16 mg/dL (ref 6–20)
CO2: 24 mmol/L (ref 22–32)
Calcium: 8.5 mg/dL — ABNORMAL LOW (ref 8.9–10.3)
Chloride: 107 mmol/L (ref 98–111)
Creatinine, Ser: 1.2 mg/dL (ref 0.61–1.24)
GFR, Estimated: >60 mL/min (ref >60)
Glucose, Bld: 90 mg/dL (ref 70–99)
Potassium: 4 mmol/L (ref 3.5–5.1)
Sodium: 139 mmol/L (ref 135–145)

## 2022-04-11 LAB — CBC
HCT: 46.7 % (ref 39.0–52.0)
Hemoglobin: 15.5 g/dL (ref 13.0–17.0)
MCH: 30.6 pg (ref 26.0–34.0)
MCHC: 33.2 g/dL (ref 30.0–36.0)
MCV: 92.1 fL (ref 80.0–100.0)
Platelets: 94 10*3/uL — ABNORMAL LOW (ref 150–400)
RBC: 5.07 MIL/uL (ref 4.22–5.81)
RDW: 14.6 % (ref 11.5–15.5)
WBC: 8.9 10*3/uL (ref 4.0–10.5)
nRBC: 0 % (ref 0.0–0.2)

## 2022-04-11 LAB — LIPOPROTEIN A (LPA): Lipoprotein (a): 11.4 nmol/L (ref <75.0)

## 2022-04-11 MED ORDER — ROSUVASTATIN CALCIUM 5 MG PO TABS
20.0000 mg | ORAL_TABLET | Freq: Every day | ORAL | Status: DC
  Administered 2022-04-11 – 2022-04-18 (×7): 20 mg via ORAL
  Filled 2022-04-11: qty 1
  Filled 2022-04-11: qty 4
  Filled 2022-04-11 (×5): qty 1

## 2022-04-11 MED ORDER — SENNOSIDES-DOCUSATE SODIUM 8.6-50 MG PO TABS
2.0000 | ORAL_TABLET | Freq: Two times a day (BID) | ORAL | Status: DC
  Administered 2022-04-11 – 2022-04-18 (×14): 2 via ORAL
  Filled 2022-04-11 (×13): qty 2

## 2022-04-11 MED ORDER — METOPROLOL SUCCINATE ER 25 MG PO TB24
12.5000 mg | ORAL_TABLET | Freq: Every day | ORAL | Status: DC
  Administered 2022-04-11 – 2022-04-15 (×5): 12.5 mg via ORAL
  Filled 2022-04-11 (×6): qty 1

## 2022-04-11 MED ORDER — ICOSAPENT ETHYL 1 G PO CAPS
2.0000 g | ORAL_CAPSULE | Freq: Two times a day (BID) | ORAL | Status: DC
  Administered 2022-04-11 – 2022-04-18 (×14): 2 g via ORAL
  Filled 2022-04-11 (×16): qty 2

## 2022-04-11 MED ORDER — METOPROLOL TARTRATE 12.5 MG HALF TABLET
12.5000 mg | ORAL_TABLET | Freq: Two times a day (BID) | ORAL | Status: DC
Start: 2022-04-11 — End: 2022-04-11
  Filled 2022-04-11: qty 1

## 2022-04-11 NOTE — TOC Benefit Eligibility Note (Signed)
Patient Teacher, English as a foreign language completed.    The patient is currently admitted and upon discharge could be taking icosapent ethyl (Vascepa) 1 g capsules.  Requires Prior Authorization  The patient is insured through [place], [place] Patient Advocate Specialist [place] Patient Advocate Team Direct Number: [phone]  Fax: [phone]

## 2022-04-11 NOTE — Care Management (Signed)
  Transition of Care [facility]) Screening Note   Patient Details  Name: Bob Ross Date of Birth: 07-10-1966   Transition of Care [facility]) CM/SW Contact:    Bethena Roys, RN Phone Number: 04/11/2022, 12:10 PM    Transition of Care Department [facility]) has reviewed the patient and no TOC needs have been identified at this time. We will continue to monitor patient advancement through interdisciplinary progression rounds. If new patient transition needs arise, please place a TOC consult.

## 2022-04-11 NOTE — Progress Notes (Signed)
  [place] VALVE TEAM   Patient seen in surgical consultation by Dr. Cyndia Bent with plans for dental evaluation and PFTs while he is here prior to surgery. I have placed orders for dental orthopantogram, inpatient dental referral, and inpatient PFTs to be performed.    Kathyrn Drown NP-C Structural Heart Team  Pager: [phone] Phone: [phone]

## 2022-04-11 NOTE — Progress Notes (Signed)
Left a message for dental consult.  Awaiting for a call back.  Idolina Primer, RN

## 2022-04-11 NOTE — Telephone Encounter (Signed)
Pharmacy Patient Advocate Encounter  Insurance verification completed.    The patient is insured through [employer]   The patient is currently admitted and ran test claims for the following: icosapent ethyl (Vascepa) 1 g capsules..  Copays and coinsurance results were relayed to Inpatient clinical team.

## 2022-04-11 NOTE — Telephone Encounter (Signed)
Patient Advocate Encounter   Received notification that prior authorization for Icosapent Ethyl 1GM capsules is required.   PA submitted on 04/11/2022 Key [id number] Status is pending       Lyndel Safe, [place] Patient Advocate Specialist [place] Patient Advocate Team Direct Number: [phone]  Fax: [phone]

## 2022-04-11 NOTE — Telephone Encounter (Signed)
Patient Advocate Encounter  Prior Authorization for Icosapent Ethyl 1GM capsules  has been approved.    PA# [id number] Effective dates: 04/11/2022 through 04/12/2023  Patients co-pay is $10.00.     Lyndel Safe, [place] Patient Advocate Specialist [place] Patient Advocate Team Direct Number: [phone]  Fax: [phone]

## 2022-04-11 NOTE — Progress Notes (Addendum)
Progress Note  Patient Name: Bob Ross Date of Encounter: 04/11/2022  [facility] Cardiologist: None   Subjective   He is feeling well, no chest pain or SOB. He is waiting for his valve surgery. He denied leg edema.   Inpatient Medications    Scheduled Meds:  aspirin EC  81 mg Oral Daily   enoxaparin (LOVENOX) injection  40 mg Subcutaneous Daily   icosapent Ethyl  2 g Oral BID   metoprolol succinate  12.5 mg Oral Daily   nicotine  21 mg Transdermal Daily   rosuvastatin  20 mg Oral Daily   senna-docusate  2 tablet Oral BID   sodium chloride flush  3 mL Intravenous Q12H   sodium chloride flush  3 mL Intravenous Q12H   Continuous Infusions:  sodium chloride     sodium chloride     PRN Meds: sodium chloride, sodium chloride, acetaminophen, albuterol, nitroGLYCERIN, ondansetron (ZOFRAN) IV, sodium chloride flush, sodium chloride flush, traMADol   Vital Signs    Vitals:   04/10/22 1715 04/10/22 1919 04/11/22 0355 04/11/22 1018  BP: 107/76 115/83 105/78 112/82  Pulse: 70 74 74 60  Resp: '18 16 20   '$ Temp: 98.4 F (36.9 C) 98.1 F (36.7 C) 98.3 F (36.8 C)   TempSrc: Oral Oral Oral   SpO2: 94% 94% 94%   Weight:      Height:        Intake/Output Summary (Last 24 hours) at 04/11/2022 1058 Last data filed at 04/11/2022 1020 Gross per 24 hour  Intake 486 ml  Output --  Net 486 ml      04/10/2022    6:06 AM 04/09/2022    1:49 PM 03/25/2022    8:02 AM  Last 3 Weights  Weight (lbs) 220 lb 9.6 oz 222 lb 223 lb 2 oz  Weight (kg) 100.064 kg 100.699 kg 101.209 kg      Telemetry    Sinus rhythm  - Personally Reviewed  ECG    No new tracing  - Personally Reviewed  Physical Exam   General Appearance: In no apparent distress, laying in bed HEENT: Normocephalic, atraumatic.  Neck: Supple, trachea midline, no JVDs Cardiovascular: Regular rate and rhythm, normal G8-T1,  systolic murmur grade III RUSB Respiratory: Resting breathing unlabored, lungs sounds clear to  auscultation bilaterally, no use of accessory muscles. On room air.  No wheezes, rales or rhonchi.   Gastrointestinal: Bowel sounds positive, abdomen round with hernia Extremities: Able to move all extremities in bed without difficulty, no edema Skin: Intact, warm, dry. Right radial site without hematoma, bleeding, pulse 2+, no neurovascular deficit  Neurologic: Alert, oriented to person, place and time. Fluent speech, no facial droop, no cognitive deficit Psychiatric: Normal affect. Mood is appropriate.   Labs    High Sensitivity Troponin:   Recent Labs  Lab 04/09/22 1424 04/09/22 1617  TROPONINIHS 489* 1,146*     Chemistry Recent Labs  Lab 04/09/22 1424 04/10/22 0142 04/11/22 0355  NA 140 139 139  K 4.3 4.2 4.0  CL 110 111 107  CO2 '25 24 24  '$ GLUCOSE 82 106* 90  BUN '17 18 16  '$ CREATININE 1.12 1.21 1.20  CALCIUM 9.1 8.2* 8.5*  GFRNONAA >60 >60 >60  ANIONGAP 5 4* 8    Lipids  Recent Labs  Lab 04/10/22 0142  CHOL 126  TRIG 309*  HDL 36*  LDLCALC 28  CHOLHDL 3.5    Hematology Recent Labs  Lab 04/09/22 1424 04/10/22 0142 04/11/22 0355  WBC 11.4* 10.5 8.9  RBC 5.30 4.81 5.07  HGB 16.4 15.1 15.5  HCT 49.3 44.7 46.7  MCV 93.0 92.9 92.1  MCH 30.9 31.4 30.6  MCHC 33.3 33.8 33.2  RDW 14.8 14.9 14.6  PLT 117* 101* 94*   Thyroid No results for input(s): "TSH", "FREET4" in the last 168 hours.  BNPNo results for input(s): "BNP", "PROBNP" in the last 168 hours.  DDimer No results for input(s): "DDIMER" in the last 168 hours.   Radiology    DG Orthopantogram  Result Date: 04/11/2022 CLINICAL DATA:  Preoperative assessment for cardiac surgery EXAM: ORTHOPANTOGRAM/PANORAMIC COMPARISON:  None Available. FINDINGS: Panorex view of the mandible was obtained. There are numerous missing teeth. Erosive changes are seen just below the crowns of the left upper canine (tooth 11), left inferior molar (tooth 19) and right inferior molar (tooth 30). No bony erosive changes.  Visualized sinuses are clear. IMPRESSION: Dental caries as above.  No acute or destructive bony lesions. Electronically Signed   By: Randa Ngo M.D.   On: 04/11/2022 08:31   CT CORONARY MORPH W/CTA COR W/SCORE W/CA W/CM &/OR WO/CM  Addendum Date: 04/10/2022   ADDENDUM REPORT: 04/10/2022 22:10 CLINICAL DATA:  11 -year-old male with severe aortic stenosis being evaluated for a TAVR procedure. EXAM: Cardiac TAVR CT TECHNIQUE: The patient was scanned on a [employer]. A 120 kV retrospective scan was triggered in the descending thoracic aorta at 111 HU's. Gantry rotation speed was 250 msecs and collimation was .6 mm. No beta blockade or nitro were given. The 3D data set was reconstructed in 5% intervals of the R-R cycle. Systolic and diastolic phases were analyzed on a dedicated work station using MPR, MIP and VRT modes. The patient received 100 cc of contrast. FINDINGS: Aortic Root: Aortic valve: Bicuspid aortic valve with fusion of right and noncoronary cusps Aortic valve calcium score: 4864 Aortic annulus: Diameter: 58m x 217mPerimeter: 8687mrea: 579 mm^2 Calcifications: Moderate calcification adjacent to right coronary cusp Coronary height: Min Left - 76m6max Left - 21mm49mn Right - 76mm 67mtubular height: Left cusp - 25mm; 93mt cusp - 20mm; N66mronary cusp - 22mm LVO12ms measured 3 mm below the annulus): Diameter: 30mm x 2612mrea:27mmm^2 Calc20mations: Moderate calcification inferior to RCC Aortic sinus width: Left cusp - 35mm; Right 66m - 31mm; Noncoro53m cusp - 31mm Sinotubul32munction width: 31mm x 29mm Opt77m Flu36mcopic Angle for Delivery: RAO 3 CRA 9 Cardiac: Right atrium: Normal size Right ventricle: Normal size Pulmonary arteries: Normal size Pulmonary veins: Normal configuration Left atrium: Mild enlargement Left ventricle: Normal size Pericardium: Normal thickness Coronary arteries: Coronary calcium score 757 (98th percentile) IMPRESSION: 1. Bicuspid aortic valve with  fusion of right and noncoronary cusps. Severe calcifications (AV calcium score 4864) 2. Aortic annulus measures 28mm x 28mm with 43mmete67mmm and area 579 m6m Moderate annular calcifications adjacent to right coronary cusp and extending into LVOT. Annular measurements are suitable for delivery of 29mm Edwards Sapien 63mlve 3. Sufficient coronary to annulus distance, measuring 76mm to left main and4mm to RCA 4. Optimum57moroscopic Angle for Delivery:  RAO 3 CRA 9 5. Coronary calcium score 757 (98th percentile) Electronically Signed   By: Christopher  Schumann MOswaldo Milian22:10   Result Date: 04/10/2022 EXAM: OVER-READ INTERPRETATION  CT CHEST The following report is a limited chest CT over-read performed by radiologist Dr. Leah Strickland [facility] [place]. This [place]-read does not include interpretation of  cardiac or coronary anatomy or pathology. The cardiac TAVR interpretation by the cardiologist is attached. FINDINGS: Extracardiac findings will be described separately under dictation for contemporaneously obtained CTA chest, abdomen and pelvis. IMPRESSION: Please see separate dictation for contemporaneously obtained CTA chest, abdomen and pelvis dated 04/10/2022 for full description of relevant extracardiac findings. Electronically Signed: By: Yetta Glassman M.D. On: 04/10/2022 15:48   CT ANGIO ABDOMEN PELVIS  W &/OR WO CONTRAST  Result Date: 04/10/2022 CLINICAL DATA:  Preop evaluation for aortic valve replacement EXAM: CTA ABDOMEN AND PELVIS WITHOUT AND WITH CONTRAST TECHNIQUE: Multidetector CT imaging of the abdomen and pelvis was performed using the standard protocol during bolus administration of intravenous contrast. Multiplanar reconstructed images and MIPs were obtained and reviewed to evaluate the vascular anatomy. RADIATION DOSE REDUCTION: This exam was performed according to the departmental dose-optimization program which includes automated exposure  control, adjustment of the mA and/or kV according to patient size and/or use of iterative reconstruction technique. CONTRAST:  30m OMNIPAQUE IOHEXOL 350 MG/ML SOLN COMPARISON:  CT abdomen and pelvis dated October 03, 2016 FINDINGS: CTA CHEST FINDINGS Cardiovascular: Normal heart size. No pericardial effusion. Aortic valve thickening and calcifications. Normal caliber thoracic aorta with mild atherosclerotic disease. Standard three-vessel aortic arch with no significant stenosis. No suspicious central filling defects of the pulmonary arteries. Mediastinum/Nodes: Esophagus and thyroid are unremarkable. Calcified mediastinal and right hilar lymph nodes, likely sequela of prior granulomatous infection. No pathologically enlarged lymph nodes seen in the chest. Lungs/Pleura: Central airways are patent. Mild centrilobular emphysema. Smooth right pleural thickening. Linear opacities of the right lung base, likely due to scarring. Musculoskeletal: No chest wall abnormality. No acute or significant osseous findings. CTA ABDOMEN AND PELVIS FINDINGS Hepatobiliary: Unchanged linear calcification of the hepatic lobe, possibly due to prior intervention granulomatous disease. Gallstone with no gallbladder wall thickening. No biliary ductal dilation. Pancreas: Unremarkable. No pancreatic ductal dilatation or surrounding inflammatory changes. Spleen: Normal in size without focal abnormality. Adrenals/Urinary Tract: Bilateral adrenal glands are unremarkable. Severely atrophic right kidney. No hydronephrosis. Scattered low-attenuation lesions of the left kidney, largest is compatible with a simple cysts, others are too small to completely characterize, no further follow-up imaging is recommended. Stomach/Bowel: Stomach is within normal limits. Prior partial resection of the sigmoid colon. No evidence of bowel wall thickening, distention, or inflammatory changes. Vascular/lymphatic: Calcified abdominal lymph nodes of the gastrohepatic  ligament, unchanged when compared with prior exam. No enlarged noncalcified lymph nodes seen in the abdomen or pelvis. Severe calcified and noncalcified plaque of the abdominal aorta. Dilated infrarenal abdominal aorta, measuring up to 3.1 cm. 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 Reproductive: Prostatomegaly, measuring up to 4.8 cm. Other: Stable linear calcified structure of the right lower quadrant seen on series 4, image 197. large amount of rectus diastasis, similar to prior. Prior ventral abdominal wall hernia repair with small defect of the left lower quadrant seen on series 4, image 191, unchanged when compared prior. Musculoskeletal: No acute or significant osseous findings. VASCULAR MEASUREMENTS PERTINENT TO TAVR: AORTA: Minimal Aortic Diameter -  16.5 mm Severity of Aortic Calcification-moderate RIGHT PELVIS: Right Common Iliac Artery - Minimal Diameter-8.1 mm Tortuosity-severe Calcification-moderate Right External Iliac Artery - Minimal Diameter-7.9 mm Tortuosity-mild Calcification-mild Right Common Femoral Artery - Minimal Diameter-6.1 mm Tortuosity-mild Calcification-mild LEFT PELVIS: Left Common Iliac Artery - Minimal Diameter-8.4 mm Tortuosity-moderate Calcification-moderate Left External Iliac Artery - Minimal Diameter-7.1 mm  Tortuosity-none Calcification-mild Left Common Femoral Artery - Minimal Diameter-6.8 mm Tortuosity-none Calcification-mild Review of the MIP images confirms the above findings. IMPRESSION: 1. Vascular findings and measurements pertinent to potential TAVR procedure, as detailed above. 2. Thickening and calcification of the aortic valve, compatible with reported clinical history of aortic stenosis. 3. 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 Left main and 3 vessel coronary artery disease. 4.  Infrarenal abdominal aortic aneurysm measuring up to 3.1 cm. Recommend follow-up ultrasound every 3 years. This recommendation follows ACR consensus guidelines: White Paper of the ACR Incidental Findings Committee II on Vascular Findings. J Am Coll Radiol 2013; 10:789-794. 5. 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 Electronically Signed   By: Yetta Glassman M.D.   On: 04/10/2022 15:41   CT ANGIO CHEST AORTA W/CM & OR WO/CM  Result Date: 04/10/2022 CLINICAL DATA:  Preop evaluation for aortic valve replacement EXAM: CTA ABDOMEN AND PELVIS WITHOUT AND WITH CONTRAST TECHNIQUE: Multidetector CT imaging of the abdomen and pelvis was performed using the standard protocol during bolus administration of intravenous contrast. Multiplanar reconstructed images and MIPs were obtained and reviewed to evaluate the vascular anatomy. RADIATION DOSE REDUCTION: This exam was performed according to the departmental dose-optimization program which includes automated exposure control, adjustment of the mA and/or kV according to patient size and/or use of iterative reconstruction technique. CONTRAST:  52m OMNIPAQUE IOHEXOL 350 MG/ML SOLN COMPARISON:  CT abdomen and pelvis dated October 03, 2016 FINDINGS: CTA CHEST FINDINGS Cardiovascular: Normal heart size. No pericardial effusion. Aortic valve thickening and calcifications. Normal caliber thoracic aorta with mild atherosclerotic disease. Standard three-vessel aortic arch with no significant stenosis. No suspicious central filling defects of the pulmonary arteries. Mediastinum/Nodes: Esophagus and thyroid are unremarkable. Calcified mediastinal and right hilar lymph nodes, likely sequela of prior granulomatous infection. No pathologically enlarged lymph nodes seen in the chest. Lungs/Pleura: Central airways are patent. Mild centrilobular emphysema. Smooth right pleural thickening. Linear opacities of the right lung base, likely due to scarring. Musculoskeletal: No  chest wall abnormality. No acute or significant osseous findings. CTA ABDOMEN AND PELVIS FINDINGS Hepatobiliary: Unchanged linear calcification of the hepatic lobe, possibly due to prior intervention granulomatous disease. Gallstone with no gallbladder wall thickening. No biliary ductal dilation. Pancreas: Unremarkable. No pancreatic ductal dilatation or surrounding inflammatory changes. Spleen: Normal in size without focal abnormality. Adrenals/Urinary Tract: Bilateral adrenal glands are unremarkable. Severely atrophic right kidney. No hydronephrosis. Scattered low-attenuation lesions of the left kidney, largest is compatible with a simple cysts, others are too small to completely characterize, no further follow-up imaging is recommended. Stomach/Bowel: Stomach is within normal limits. Prior partial resection of the sigmoid colon. No evidence of bowel wall thickening, distention, or inflammatory changes. Vascular/lymphatic: Calcified abdominal lymph nodes of the gastrohepatic ligament, unchanged when compared with prior exam. No enlarged noncalcified lymph nodes seen in the abdomen or pelvis. Severe calcified and noncalcified plaque of the abdominal aorta. Dilated infrarenal abdominal aorta, measuring up to 3.1 cm. 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 Reproductive: Prostatomegaly, measuring up to 4.8 cm. Other: Stable linear calcified structure of the right lower quadrant seen on series 4, image 197. large amount of rectus diastasis, similar to prior. Prior ventral abdominal wall hernia repair with small defect of the left lower quadrant seen on series 4, image 191, unchanged when compared prior. Musculoskeletal: No acute or significant osseous findings.  VASCULAR MEASUREMENTS PERTINENT TO TAVR: AORTA: Minimal Aortic Diameter -  16.5 mm Severity of Aortic  Calcification-moderate RIGHT PELVIS: Right Common Iliac Artery - Minimal Diameter-8.1 mm Tortuosity-severe Calcification-moderate Right External Iliac Artery - Minimal Diameter-7.9 mm Tortuosity-mild Calcification-mild Right Common Femoral Artery - Minimal Diameter-6.1 mm Tortuosity-mild Calcification-mild LEFT PELVIS: Left Common Iliac Artery - Minimal Diameter-8.4 mm Tortuosity-moderate Calcification-moderate Left External Iliac Artery - Minimal Diameter-7.1 mm Tortuosity-none Calcification-mild Left Common Femoral Artery - Minimal Diameter-6.8 mm Tortuosity-none Calcification-mild Review of the MIP images confirms the above findings. IMPRESSION: 1. Vascular findings and measurements pertinent to potential TAVR procedure, as detailed above. 2. Thickening and calcification of the aortic valve, compatible with reported clinical history of aortic stenosis. 3. 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 Left main and 3 vessel coronary artery disease. 4. Infrarenal abdominal aortic aneurysm measuring up to 3.1 cm. Recommend follow-up ultrasound every 3 years. This recommendation follows ACR consensus guidelines: White Paper of the ACR Incidental Findings Committee II on Vascular Findings. J Am Coll Radiol 2013; 10:789-794. 5. 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 Electronically Signed   By: Yetta Glassman M.D.   On: 04/10/2022 15:41   ECHOCARDIOGRAM COMPLETE  Result Date: 04/10/2022    ECHOCARDIOGRAM REPORT   Patient Name:   JHAYDEN DEMURO  Date of Exam: 04/10/2022 Medical Rec #:  [id number]  Height:       71.0 in Accession #:    [id number] Weight:       220.6 lb Date of Birth:  [birth year]   BSA:          2.198 m Patient Age:    57 years   BP:           100/74 mmHg Patient Gender: M          HR:           65 bpm. Exam Location:  Inpatient Procedure: 2D Echo, Cardiac Doppler and Color Doppler Indications:    Acute myocardial infarction, unspecified I21.9  History:         Patient has no prior history of Echocardiogram examinations.                 Cardiomyopathy, CAD; Risk Factors:Hypertension and Dyslipidemia.  Sonographer:    Bernadene Person RDCS Referring Phys: [id number] [place]  1. Left ventricular ejection fraction, by estimation, is 50 to 55%. Left ventricular ejection fraction by 2D MOD biplane is 52.6 %. The left ventricle has low normal function. The left ventricle demonstrates regional wall motion abnormalities (see scoring diagram/findings for description). The left ventricular internal cavity size was mildly dilated. Left ventricular diastolic parameters are consistent with Grade I diastolic dysfunction (impaired relaxation). Elevated left ventricular end-diastolic pressure. There is mild hypokinesis of the left ventricular, basal-mid inferior wall and inferolateral wall.  2. Right ventricular systolic function is normal. The right ventricular size is normal.  3. The mitral valve is grossly normal. Trivial mitral valve regurgitation.  4. The aortic valve is calcified. There is moderate calcification of the aortic valve. Aortic valve regurgitation is not visualized. Severe aortic valve stenosis. Aortic valve area, by VTI measures 0.52 cm. Aortic valve mean gradient measures 48.7 mmHg. Aortic valve Vmax measures 4.29 m/s. DI is 0.16  5. The inferior vena cava is normal in size with greater than 50% respiratory variability, suggesting right atrial pressure of 3 mmHg. Comparison(s): No prior Echocardiogram. FINDINGS  Left Ventricle: Left ventricular ejection fraction, by estimation, is 50  to 55%. Left ventricular ejection fraction by 2D MOD biplane is 52.6 %. The left ventricle has low normal function. The left ventricle demonstrates regional wall motion abnormalities. Mild hypokinesis of the left ventricular, basal-mid inferior wall and inferolateral wall. The left ventricular internal cavity size was mildly dilated. There is no left ventricular  hypertrophy. Left ventricular diastolic parameters are consistent with Grade I diastolic dysfunction (impaired relaxation). Elevated left ventricular end-diastolic pressure.  LV Wall Scoring: The antero-lateral wall and posterior wall are hypokinetic. The inferior wall is normal. Right Ventricle: The right ventricular size is normal. No increase in right ventricular wall thickness. Right ventricular systolic function is normal. Left Atrium: Left atrial size was normal in size. Right Atrium: Right atrial size was normal in size. Pericardium: There is no evidence of pericardial effusion. Mitral Valve: The mitral valve is grossly normal. Trivial mitral valve regurgitation. Tricuspid Valve: The tricuspid valve is grossly normal. Tricuspid valve regurgitation is trivial. Aortic Valve: The aortic valve is calcified. There is moderate calcification of the aortic valve. Aortic valve regurgitation is not visualized. Severe aortic stenosis is present. Aortic valve mean gradient measures 48.7 mmHg. Aortic valve peak gradient measures 73.7 mmHg. Aortic valve area, by VTI measures 0.52 cm. Pulmonic Valve: The pulmonic valve was normal in structure. Pulmonic valve regurgitation is not visualized. Aorta: The aortic root and ascending aorta are structurally normal, with no evidence of dilitation. Venous: The inferior vena cava is normal in size with greater than 50% respiratory variability, suggesting right atrial pressure of 3 mmHg. IAS/Shunts: No atrial level shunt detected by color flow Doppler.  LEFT VENTRICLE PLAX 2D                        Biplane EF (MOD) LVIDd:         5.90 cm         LV Biplane EF:   Left LVIDs:         4.10 cm                          ventricular LV PW:         1.10 cm                          ejection LV IVS:        1.00 cm                          fraction by LVOT diam:     2.00 cm                          2D MOD LV SV:         62                               biplane is LV SV Index:   28                                52.6 %. LVOT Area:     3.14 cm                                Diastology  LV e' medial:    5.03 cm/s LV Volumes (MOD)               LV E/e' medial:  16.3 LV vol d, MOD    125.0 ml      LV e' lateral:   7.54 cm/s A2C:                           LV E/e' lateral: 10.8 LV vol d, MOD    121.0 ml A4C: LV vol s, MOD    56.3 ml A2C: LV vol s, MOD    65.9 ml A4C: LV SV MOD A2C:   68.7 ml LV SV MOD A4C:   121.0 ml LV SV MOD BP:    69.3 ml RIGHT VENTRICLE RV S prime:     11.30 cm/s TAPSE (M-mode): 2.2 cm LEFT ATRIUM             Index        RIGHT ATRIUM           Index LA diam:        3.10 cm 1.41 cm/m   RA Area:     13.70 cm LA Vol (A2C):   51.7 ml 23.52 ml/m  RA Volume:   34.40 ml  15.65 ml/m LA Vol (A4C):   47.2 ml 21.47 ml/m LA Biplane Vol: 51.9 ml 23.61 ml/m  AORTIC VALVE AV Area (Vmax):    0.65 cm AV Area (Vmean):   0.55 cm AV Area (VTI):     0.52 cm AV Vmax:           429.33 cm/s AV Vmean:          331.000 cm/s AV VTI:            1.42 m AV Peak Grad:      73.7 mmHg AV Mean Grad:      48.7 mmHg LVOT Vmax:         88.30 cm/s LVOT Vmean:        58.300 cm/s LVOT VTI:          0.198 m LVOT/AV VTI ratio: 0.16  AORTA Ao Root diam: 3.10 cm Ao Asc diam:  3.50 cm MITRAL VALVE MV Area (PHT): 4.89 cm    SHUNTS MV Decel Time: 155 msec    Systemic VTI:  0.20 m MV E velocity: 81.80 cm/s  Systemic Diam: 2.00 cm MV A velocity: 79.70 cm/s MV E/A ratio:  1.03 Lyman Bishop MD Electronically signed by Lyman Bishop MD Signature Date/Time: 04/10/2022/12:08:40 PM    Final    CARDIAC CATHETERIZATION  Result Date: 04/09/2022 1.  Mild obstructive coronary artery disease. 2.  Severely elevated LVEDP of 29 mmHg. 3.  Heavily calcified aortic valve with a mean gradient of 52 mmHg and a peak to peak gradient of 62 mmHg consistent with severe aortic stenosis; aortic waveforms demonstrated parvus and tardus. 4.  Preserved left ventricular ejection fraction. Recommendation: Echocardiogram to  evaluate for severe aortic stenosis which will inform therapy moving forward.   DG Chest Portable 1 View  Result Date: 04/09/2022 CLINICAL DATA:  Chest pain EXAM: PORTABLE CHEST 1 VIEW COMPARISON:  03/29/2021 FINDINGS: The heart size and mediastinal contours are within normal limits. Unchanged small, chronic right pleural effusion and/or pleural thickening with associated bandlike scarring or atelectasis of the right lung base. No acute appearing airspace opacity. The visualized skeletal structures are unremarkable. IMPRESSION: 1. Unchanged small, chronic right pleural  effusion and/or pleural thickening with associated bandlike scarring or atelectasis of the right lung base. 2.  No acute appearing airspace opacity. Electronically Signed   By: Delanna Ahmadi M.D.   On: 04/09/2022 15:15    Cardiac Studies   Cardiac CTA 04/10/22:   1. Bicuspid aortic valve with fusion of right and noncoronary cusps. Severe calcifications (AV calcium score 4864)   2. Aortic annulus measures 15m x 296mwith perimeter 8644mnd area 579 mm^2. Moderate annular calcifications adjacent to right coronary cusp and extending into LVOT. Annular measurements are suitable for delivery of 47m65mwards Sapien 3 valve   3. Sufficient coronary to annulus distance, measuring 14mm37mleft main and 14mm 45mCA   4. Optimum Fluoroscopic Angle for Delivery:  RAO 3 CRA 9   5. Coronary calcium score 757 (98th percentile)   Echo from 04/10/22:   1. Left ventricular ejection fraction, by estimation, is 50 to 55%. Left  ventricular ejection fraction by 2D MOD biplane is 52.6 %. The left  ventricle has low normal function. The left ventricle demonstrates  regional wall motion abnormalities (see  scoring diagram/findings for description). The left ventricular internal  cavity size was mildly dilated. Left ventricular diastolic parameters are  consistent with Grade I diastolic dysfunction (impaired relaxation).  Elevated left  ventricular  end-diastolic pressure. There is mild hypokinesis of the left ventricular,  basal-mid inferior wall and inferolateral wall.   2. Right ventricular systolic function is normal. The right ventricular  size is normal.   3. The mitral valve is grossly normal. Trivial mitral valve  regurgitation.   4. The aortic valve is calcified. There is moderate calcification of the  aortic valve. Aortic valve regurgitation is not visualized. Severe aortic  valve stenosis. Aortic valve area, by VTI measures 0.52 cm. Aortic valve  mean gradient measures 48.7  mmHg. Aortic valve Vmax measures 4.29 m/s. DI is 0.16   5. The inferior vena cava is normal in size with greater than 50%  respiratory variability, suggesting right atrial pressure of 3 mmHg.   Comparison(s): No prior Echocardiogram.    LHC 04/09/22:   1.  Mild non-obstructive coronary artery disease. 2.  Severely elevated LVEDP of 29 mmHg. 3.  Heavily calcified aortic valve with a mean gradient of 52 mmHg and a peak to peak gradient of 62 mmHg consistent with severe aortic stenosis; aortic waveforms demonstrated parvus and tardus. 4.  Preserved left ventricular ejection fraction.   Recommendation: Echocardiogram to evaluate for severe aortic stenosis which will inform therapy moving forward. _____________  Patient Profile     56 y.o22male admitted for NSTEMI, found severe AS.   Assessment & Plan    NSTEMI CAD - Hs trop 489 >1146 - EKG showed nonspecific  inferolateral ST-T wave abnormalities - LHC showed mild non-obstructive coronary artery disease - medical therapy: ASA '81mg'$ , switch lipitor '80mg'$  to Crestor '20mg'$  daily (had myalgia in the past), on lisinopril '10mg'$  PTA, will stop lisinopril and start Metoprolol XL 12.5 mg daily, monitor BP and HR for tolerance    History if NICM Severe Aortic Stenosis  - LVEF 40% at cardiac catheterization in 2021, nonischemic given cath finding - Echo this admission showed improved LVEF  50-55%, grade I DD, mild hypokinesis of the left ventricular, basal-mid inferior wall and inferolateral wall. Trivial MR. moderate calcification of the aortic valve, Severe aortic valve stenosis. Aortic valve area, by VTI measures 0.52 cm. Aortic valve  mean gradient measures 48.7 mmHg - LHC showed  severe aortic stenosis mean gradient of 52 mmHg and a peak to peak gradient of 62 mmHg consistent with severe aortic stenosis. LVEDP was 27mHg  - s/p Lasix '40mg'$  x1 yesterday, BP soft limiting diuresis, clinically euvolemic today  - CT surgery consulted,  need PFT and dental evaluation to be completed before multidisciplinary discussion    HLD - HDL 36, LDL 28, Trig 309 -- on atorvastatin '80mg'$  daily -- adding vascepa today    Tobacco use - recommend cessation   Hx of metastatic Colon cancer - in remission     For questions or updates, please contact [facility] Please consult [email or web address] for contact info under        Signed, XMargie Billet NP  04/11/2022, 10:58 AM    Patient seen, examined. Available data reviewed. Agree with findings, assessment, and plan as outlined by XMargie Billet NP.  The patient is independently interviewed and examined.  His mother is at the bedside.  He is alert, oriented, in no distress.  Lungs are clear bilaterally, heart is regular rate and rhythm with a 3/6 harsh systolic murmur at the right upper sternal border, abdomen with unchanged chronic abdominal hernia, extremities are without edema.  The patient had markedly elevated LVEDP consistent with acute on chronic combined systolic and diastolic heart failure (now with reduced LVEF), and has responded well to a single dose of IV furosemide.  Will hold further furosemide to avoid hypotension in the setting of severe aortic stenosis.  Dr. BVivi Martensnotes reviewed.  Patient to have orthopantogram and inpatient dental consultation.  He will have PFTs.  I have reviewed his CTA studies that were performed yesterday.  He has a  bicuspid aortic valve (Sievers type I) with suitable transfemoral access and should be a candidate for TAVR.  We will discuss her case at our multidisciplinary heart team meeting and decide on the timing of his intervention.  He will need to undergo treatment in the near future.  All of this is discussed with the patient today and he understands our plan.  MSherren Mocha M.D. 04/11/2022 11:54 AM

## 2022-04-12 DIAGNOSIS — I214 Non-ST elevation (NSTEMI) myocardial infarction: Secondary | ICD-10-CM | POA: Diagnosis not present

## 2022-04-12 NOTE — Progress Notes (Signed)
Progress Note  Patient Name: Bob Ross Date of Encounter: 04/12/2022  [facility] Cardiologist: None   Subjective   No complaints  Inpatient Medications    Scheduled Meds:  aspirin EC  81 mg Oral Daily   enoxaparin (LOVENOX) injection  40 mg Subcutaneous Daily   icosapent Ethyl  2 g Oral BID   metoprolol succinate  12.5 mg Oral Daily   nicotine  21 mg Transdermal Daily   rosuvastatin  20 mg Oral Daily   senna-docusate  2 tablet Oral BID   sodium chloride flush  3 mL Intravenous Q12H   sodium chloride flush  3 mL Intravenous Q12H   Continuous Infusions:  sodium chloride     sodium chloride     PRN Meds: sodium chloride, sodium chloride, acetaminophen, albuterol, nitroGLYCERIN, ondansetron (ZOFRAN) IV, sodium chloride flush, sodium chloride flush, traMADol   Vital Signs    Vitals:   04/11/22 1018 04/11/22 1439 04/11/22 2047 04/12/22 0504  BP: 112/82 118/75 119/78 125/81  Pulse: 60 60  65  Resp:  '16 16 16  '$ Temp:  98 F (36.7 C) 98.3 F (36.8 C) 98.3 F (36.8 C)  TempSrc:  Oral Oral Oral  SpO2:  96% 98%   Weight:      Height:        Intake/Output Summary (Last 24 hours) at 04/12/2022 0913 Last data filed at 04/11/2022 1020 Gross per 24 hour  Intake 6 ml  Output --  Net 6 ml      04/10/2022    6:06 AM 04/09/2022    1:49 PM 03/25/2022    8:02 AM  Last 3 Weights  Weight (lbs) 220 lb 9.6 oz 222 lb 223 lb 2 oz  Weight (kg) 100.064 kg 100.699 kg 101.209 kg      Telemetry    SR - Personally Reviewed  ECG    N/a - Personally Reviewed  Physical Exam   GEN: No acute distress.   Neck: No JVD Cardiac: RRR, 3/6 systolic murmur rusb Respiratory: Clear to auscultation bilaterally. GI: Soft, nontender, non-distended  MS: No edema; No deformity. Neuro:  Nonfocal  Psych: Normal affect   Labs    High Sensitivity Troponin:   Recent Labs  Lab 04/09/22 1424 04/09/22 1617  TROPONINIHS 489* 1,146*     Chemistry Recent Labs  Lab 04/09/22 1424  04/10/22 0142 04/11/22 0355  NA 140 139 139  K 4.3 4.2 4.0  CL 110 111 107  CO2 '25 24 24  '$ GLUCOSE 82 106* 90  BUN '17 18 16  '$ CREATININE 1.12 1.21 1.20  CALCIUM 9.1 8.2* 8.5*  GFRNONAA >60 >60 >60  ANIONGAP 5 4* 8    Lipids  Recent Labs  Lab 04/10/22 0142  CHOL 126  TRIG 309*  HDL 36*  LDLCALC 28  CHOLHDL 3.5    Hematology Recent Labs  Lab 04/09/22 1424 04/10/22 0142 04/11/22 0355  WBC 11.4* 10.5 8.9  RBC 5.30 4.81 5.07  HGB 16.4 15.1 15.5  HCT 49.3 44.7 46.7  MCV 93.0 92.9 92.1  MCH 30.9 31.4 30.6  MCHC 33.3 33.8 33.2  RDW 14.8 14.9 14.6  PLT 117* 101* 94*   Thyroid No results for input(s): "TSH", "FREET4" in the last 168 hours.  BNPNo results for input(s): "BNP", "PROBNP" in the last 168 hours.  DDimer No results for input(s): "DDIMER" in the last 168 hours.   Radiology    DG Orthopantogram  Result Date: 04/11/2022 CLINICAL DATA:  Preoperative assessment for cardiac surgery  EXAM: ORTHOPANTOGRAM/PANORAMIC COMPARISON:  None Available. FINDINGS: Panorex view of the mandible was obtained. There are numerous missing teeth. Erosive changes are seen just below the crowns of the left upper canine (tooth 11), left inferior molar (tooth 19) and right inferior molar (tooth 30). No bony erosive changes. Visualized sinuses are clear. IMPRESSION: Dental caries as above.  No acute or destructive bony lesions. Electronically Signed   By: Randa Ngo M.D.   On: 04/11/2022 08:31   CT CORONARY MORPH W/CTA COR W/SCORE W/CA W/CM &/OR WO/CM  Addendum Date: 04/10/2022   ADDENDUM REPORT: 04/10/2022 22:10 CLINICAL DATA:  56 -year-old male with severe aortic stenosis being evaluated for a TAVR procedure. EXAM: Cardiac TAVR CT TECHNIQUE: The patient was scanned on a [employer]. A 120 kV retrospective scan was triggered in the descending thoracic aorta at 111 HU's. Gantry rotation speed was 250 msecs and collimation was .6 mm. No beta blockade or nitro were given. The 3D data  set was reconstructed in 5% intervals of the R-R cycle. Systolic and diastolic phases were analyzed on a dedicated work station using MPR, MIP and VRT modes. The patient received 100 cc of contrast. FINDINGS: Aortic Root: Aortic valve: Bicuspid aortic valve with fusion of right and noncoronary cusps Aortic valve calcium score: 4864 Aortic annulus: Diameter: 50m x 276mPerimeter: 862mrea: 579 mm^2 Calcifications: Moderate calcification adjacent to right coronary cusp Coronary height: Min Left - 65m21max Left - 21mm64mn Right - 65mm 66mtubular height: Left cusp - 25mm; 32mt cusp - 20mm; N57mronary cusp - 22mm LVO26ms measured 3 mm below the annulus): Diameter: 30mm x 2642mrea:21mmm^2 Calc11mations: Moderate calcification inferior to RCC Aortic sinus width: Left cusp - 35mm; Right 70m - 31mm; Noncoro62m cusp - 31mm Sinotubul31munction width: 31mm x 29mm Opt26m Flu38mcopic Angle for Delivery: RAO 3 CRA 9 Cardiac: Right atrium: Normal size Right ventricle: Normal size Pulmonary arteries: Normal size Pulmonary veins: Normal configuration Left atrium: Mild enlargement Left ventricle: Normal size Pericardium: Normal thickness Coronary arteries: Coronary calcium score 757 (98th percentile) IMPRESSION: 1. Bicuspid aortic valve with fusion of right and noncoronary cusps. Severe calcifications (AV calcium score 4864) 2. Aortic annulus measures 28mm x 28mm with 37mmete27mmm and area 579 m31m Moderate annular calcifications adjacent to right coronary cusp and extending into LVOT. Annular measurements are suitable for delivery of 29mm Edwards Sapien 57mlve 3. Sufficient coronary to annulus distance, measuring 65mm to left main and23mm to RCA 4. Optimum56moroscopic Angle for Delivery:  RAO 3 CRA 9 5. Coronary calcium score 757 (98th percentile) Electronically Signed   By: Christopher  Schumann MOswaldo Milian22:10   Result Date: 04/10/2022 EXAM: OVER-READ INTERPRETATION  CT CHEST The following report  is a limited chest CT over-read performed by radiologist Dr. Leah Strickland [facility] [place]. This o[place]r-read does not include interpretation of cardiac or coronary anatomy or pathology. The cardiac TAVR interpretation by the cardiologist is attached. FINDINGS: Extracardiac findings will be described separately under dictation for contemporaneously obtained CTA chest, abdomen and pelvis. IMPRESSION: Please see separate dictation for contemporaneously obtained CTA chest, abdomen and pelvis dated 04/10/2022 for full description of relevant extracardiac findings. Electronically Signed: By: Leah  Strickland M.D. OYetta Glassman:48   CT ANGIO ABDOMEN PELVIS  W &/OR WO CONTRAST  Result Date: 04/10/2022 CLINICAL DATA:  Preop evaluation for aortic valve replacement EXAM: CTA ABDOMEN AND PELVIS WITHOUT AND WITH CONTRAST TECHNIQUE: Multidetector CT imaging of  the abdomen and pelvis was performed using the standard protocol during bolus administration of intravenous contrast. Multiplanar reconstructed images and MIPs were obtained and reviewed to evaluate the vascular anatomy. RADIATION DOSE REDUCTION: This exam was performed according to the departmental dose-optimization program which includes automated exposure control, adjustment of the mA and/or kV according to patient size and/or use of iterative reconstruction technique. CONTRAST:  72m OMNIPAQUE IOHEXOL 350 MG/ML SOLN COMPARISON:  CT abdomen and pelvis dated October 03, 2016 FINDINGS: CTA CHEST FINDINGS Cardiovascular: Normal heart size. No pericardial effusion. Aortic valve thickening and calcifications. Normal caliber thoracic aorta with mild atherosclerotic disease. Standard three-vessel aortic arch with no significant stenosis. No suspicious central filling defects of the pulmonary arteries. Mediastinum/Nodes: Esophagus and thyroid are unremarkable. Calcified mediastinal and right hilar lymph nodes, likely sequela of prior  granulomatous infection. No pathologically enlarged lymph nodes seen in the chest. Lungs/Pleura: Central airways are patent. Mild centrilobular emphysema. Smooth right pleural thickening. Linear opacities of the right lung base, likely due to scarring. Musculoskeletal: No chest wall abnormality. No acute or significant osseous findings. CTA ABDOMEN AND PELVIS FINDINGS Hepatobiliary: Unchanged linear calcification of the hepatic lobe, possibly due to prior intervention granulomatous disease. Gallstone with no gallbladder wall thickening. No biliary ductal dilation. Pancreas: Unremarkable. No pancreatic ductal dilatation or surrounding inflammatory changes. Spleen: Normal in size without focal abnormality. Adrenals/Urinary Tract: Bilateral adrenal glands are unremarkable. Severely atrophic right kidney. No hydronephrosis. Scattered low-attenuation lesions of the left kidney, largest is compatible with a simple cysts, others are too small to completely characterize, no further follow-up imaging is recommended. Stomach/Bowel: Stomach is within normal limits. Prior partial resection of the sigmoid colon. No evidence of bowel wall thickening, distention, or inflammatory changes. Vascular/lymphatic: Calcified abdominal lymph nodes of the gastrohepatic ligament, unchanged when compared with prior exam. No enlarged noncalcified lymph nodes seen in the abdomen or pelvis. Severe calcified and noncalcified plaque of the abdominal aorta. Dilated infrarenal abdominal aorta, measuring up to 3.1 cm. 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 Reproductive: Prostatomegaly, measuring up to 4.8 cm. Other: Stable linear calcified structure of the right lower quadrant seen on series 4, image 197. large amount of rectus diastasis, similar to prior. Prior ventral abdominal wall hernia repair with small  defect of the left lower quadrant seen on series 4, image 191, unchanged when compared prior. Musculoskeletal: No acute or significant osseous findings. VASCULAR MEASUREMENTS PERTINENT TO TAVR: AORTA: Minimal Aortic Diameter -  16.5 mm Severity of Aortic Calcification-moderate RIGHT PELVIS: Right Common Iliac Artery - Minimal Diameter-8.1 mm Tortuosity-severe Calcification-moderate Right External Iliac Artery - Minimal Diameter-7.9 mm Tortuosity-mild Calcification-mild Right Common Femoral Artery - Minimal Diameter-6.1 mm Tortuosity-mild Calcification-mild LEFT PELVIS: Left Common Iliac Artery - Minimal Diameter-8.4 mm Tortuosity-moderate Calcification-moderate Left External Iliac Artery - Minimal Diameter-7.1 mm Tortuosity-none Calcification-mild Left Common Femoral Artery - Minimal Diameter-6.8 mm Tortuosity-none Calcification-mild Review of the MIP images confirms the above findings. IMPRESSION: 1. Vascular findings and measurements pertinent to potential TAVR procedure, as detailed above. 2. Thickening and calcification of the aortic valve, compatible with reported clinical history of aortic stenosis. 3. 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 Left main and 3 vessel coronary artery disease. 4. Infrarenal abdominal aortic aneurysm measuring up to 3.1 cm. Recommend follow-up ultrasound every 3 years. This recommendation follows ACR consensus guidelines: White Paper of the ACR Incidental Findings Committee II on Vascular  Findings. J Am Coll Radiol 2013; 10:789-794. 5. 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 Electronically Signed   By: Yetta Glassman M.D.   On: 04/10/2022 15:41   CT ANGIO CHEST AORTA W/CM & OR WO/CM  Result Date: 04/10/2022 CLINICAL DATA:  Preop evaluation for aortic valve replacement EXAM: CTA ABDOMEN AND PELVIS WITHOUT AND WITH CONTRAST TECHNIQUE: Multidetector CT imaging of the abdomen and pelvis was performed using the standard  protocol during bolus administration of intravenous contrast. Multiplanar reconstructed images and MIPs were obtained and reviewed to evaluate the vascular anatomy. RADIATION DOSE REDUCTION: This exam was performed according to the departmental dose-optimization program which includes automated exposure control, adjustment of the mA and/or kV according to patient size and/or use of iterative reconstruction technique. CONTRAST:  52m OMNIPAQUE IOHEXOL 350 MG/ML SOLN COMPARISON:  CT abdomen and pelvis dated October 03, 2016 FINDINGS: CTA CHEST FINDINGS Cardiovascular: Normal heart size. No pericardial effusion. Aortic valve thickening and calcifications. Normal caliber thoracic aorta with mild atherosclerotic disease. Standard three-vessel aortic arch with no significant stenosis. No suspicious central filling defects of the pulmonary arteries. Mediastinum/Nodes: Esophagus and thyroid are unremarkable. Calcified mediastinal and right hilar lymph nodes, likely sequela of prior granulomatous infection. No pathologically enlarged lymph nodes seen in the chest. Lungs/Pleura: Central airways are patent. Mild centrilobular emphysema. Smooth right pleural thickening. Linear opacities of the right lung base, likely due to scarring. Musculoskeletal: No chest wall abnormality. No acute or significant osseous findings. CTA ABDOMEN AND PELVIS FINDINGS Hepatobiliary: Unchanged linear calcification of the hepatic lobe, possibly due to prior intervention granulomatous disease. Gallstone with no gallbladder wall thickening. No biliary ductal dilation. Pancreas: Unremarkable. No pancreatic ductal dilatation or surrounding inflammatory changes. Spleen: Normal in size without focal abnormality. Adrenals/Urinary Tract: Bilateral adrenal glands are unremarkable. Severely atrophic right kidney. No hydronephrosis. Scattered low-attenuation lesions of the left kidney, largest is compatible with a simple cysts, others are too small to  completely characterize, no further follow-up imaging is recommended. Stomach/Bowel: Stomach is within normal limits. Prior partial resection of the sigmoid colon. No evidence of bowel wall thickening, distention, or inflammatory changes. Vascular/lymphatic: Calcified abdominal lymph nodes of the gastrohepatic ligament, unchanged when compared with prior exam. No enlarged noncalcified lymph nodes seen in the abdomen or pelvis. Severe calcified and noncalcified plaque of the abdominal aorta. Dilated infrarenal abdominal aorta, measuring up to 3.1 cm. 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 Reproductive: Prostatomegaly, measuring up to 4.8 cm. Other: Stable linear calcified structure of the right lower quadrant seen on series 4, image 197. large amount of rectus diastasis, similar to prior. Prior ventral abdominal wall hernia repair with small defect of the left lower quadrant seen on series 4, image 191, unchanged when compared prior. Musculoskeletal: No acute or significant osseous findings. VASCULAR MEASUREMENTS PERTINENT TO TAVR: AORTA: Minimal Aortic Diameter -  16.5 mm Severity of Aortic Calcification-moderate RIGHT PELVIS: Right Common Iliac Artery - Minimal Diameter-8.1 mm Tortuosity-severe Calcification-moderate Right External Iliac Artery - Minimal Diameter-7.9 mm Tortuosity-mild Calcification-mild Right Common Femoral Artery - Minimal Diameter-6.1 mm Tortuosity-mild Calcification-mild LEFT PELVIS: Left Common Iliac Artery - Minimal Diameter-8.4 mm Tortuosity-moderate Calcification-moderate Left External Iliac Artery - Minimal Diameter-7.1 mm Tortuosity-none Calcification-mild Left Common Femoral Artery - Minimal Diameter-6.8 mm Tortuosity-none Calcification-mild Review of the MIP images confirms the above findings. IMPRESSION: 1. Vascular findings and measurements  pertinent to potential TAVR procedure,  as detailed above. 2. Thickening and calcification of the aortic valve, compatible with reported clinical history of aortic stenosis. 3. 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 Left main and 3 vessel coronary artery disease. 4. Infrarenal abdominal aortic aneurysm measuring up to 3.1 cm. Recommend follow-up ultrasound every 3 years. This recommendation follows ACR consensus guidelines: White Paper of the ACR Incidental Findings Committee II on Vascular Findings. J Am Coll Radiol 2013; 10:789-794. 5. 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 Electronically Signed   By: Yetta Glassman M.D.   On: 04/10/2022 15:41   ECHOCARDIOGRAM COMPLETE  Result Date: 04/10/2022    ECHOCARDIOGRAM REPORT   Patient Name:   OSHEA PERCIVAL  Date of Exam: 04/10/2022 Medical Rec #:  [id number]  Height:       71.0 in Accession #:    [id number] Weight:       220.6 lb Date of Birth:  [birth year]   BSA:          2.198 m Patient Age:    89 years   BP:           100/74 mmHg Patient Gender: M          HR:           65 bpm. Exam Location:  Inpatient Procedure: 2D Echo, Cardiac Doppler and Color Doppler Indications:    Acute myocardial infarction, unspecified I21.9  History:        Patient has no prior history of Echocardiogram examinations.                 Cardiomyopathy, CAD; Risk Factors:Hypertension and Dyslipidemia.  Sonographer:    Bernadene Person RDCS Referring Phys: [id number] [place]  1. Left ventricular ejection fraction, by estimation, is 50 to 55%. Left ventricular ejection fraction by 2D MOD biplane is 52.6 %. The left ventricle has low normal function. The left ventricle demonstrates regional wall motion abnormalities (see scoring diagram/findings for description). The left ventricular internal cavity size was mildly dilated. Left ventricular diastolic parameters are consistent with Grade I diastolic dysfunction (impaired  relaxation). Elevated left ventricular end-diastolic pressure. There is mild hypokinesis of the left ventricular, basal-mid inferior wall and inferolateral wall.  2. Right ventricular systolic function is normal. The right ventricular size is normal.  3. The mitral valve is grossly normal. Trivial mitral valve regurgitation.  4. The aortic valve is calcified. There is moderate calcification of the aortic valve. Aortic valve regurgitation is not visualized. Severe aortic valve stenosis. Aortic valve area, by VTI measures 0.52 cm. Aortic valve mean gradient measures 48.7 mmHg. Aortic valve Vmax measures 4.29 m/s. DI is 0.16  5. The inferior vena cava is normal in size with greater than 50% respiratory variability, suggesting right atrial pressure of 3 mmHg. Comparison(s): No prior Echocardiogram. FINDINGS  Left Ventricle: Left ventricular ejection fraction, by estimation, is 50 to 55%. Left ventricular ejection fraction by 2D MOD biplane is 52.6 %. The left ventricle has low normal function. The left ventricle demonstrates regional wall motion abnormalities. Mild hypokinesis of the left ventricular, basal-mid inferior wall and inferolateral wall. The left ventricular internal cavity size was mildly dilated. There is no left ventricular hypertrophy. Left ventricular diastolic parameters are consistent with Grade I diastolic dysfunction (impaired relaxation). Elevated left ventricular end-diastolic pressure.  LV Wall Scoring: The antero-lateral wall and posterior wall are hypokinetic. The inferior wall is normal. Right Ventricle: The right ventricular size is normal. No increase in right  ventricular wall thickness. Right ventricular systolic function is normal. Left Atrium: Left atrial size was normal in size. Right Atrium: Right atrial size was normal in size. Pericardium: There is no evidence of pericardial effusion. Mitral Valve: The mitral valve is grossly normal. Trivial mitral valve regurgitation. Tricuspid  Valve: The tricuspid valve is grossly normal. Tricuspid valve regurgitation is trivial. Aortic Valve: The aortic valve is calcified. There is moderate calcification of the aortic valve. Aortic valve regurgitation is not visualized. Severe aortic stenosis is present. Aortic valve mean gradient measures 48.7 mmHg. Aortic valve peak gradient measures 73.7 mmHg. Aortic valve area, by VTI measures 0.52 cm. Pulmonic Valve: The pulmonic valve was normal in structure. Pulmonic valve regurgitation is not visualized. Aorta: The aortic root and ascending aorta are structurally normal, with no evidence of dilitation. Venous: The inferior vena cava is normal in size with greater than 50% respiratory variability, suggesting right atrial pressure of 3 mmHg. IAS/Shunts: No atrial level shunt detected by color flow Doppler.  LEFT VENTRICLE PLAX 2D                        Biplane EF (MOD) LVIDd:         5.90 cm         LV Biplane EF:   Left LVIDs:         4.10 cm                          ventricular LV PW:         1.10 cm                          ejection LV IVS:        1.00 cm                          fraction by LVOT diam:     2.00 cm                          2D MOD LV SV:         62                               biplane is LV SV Index:   28                               52.6 %. LVOT Area:     3.14 cm                                Diastology                                LV e' medial:    5.03 cm/s LV Volumes (MOD)               LV E/e' medial:  16.3 LV vol d, MOD    125.0 ml      LV e' lateral:   7.54 cm/s A2C:  LV E/e' lateral: 10.8 LV vol d, MOD    121.0 ml A4C: LV vol s, MOD    56.3 ml A2C: LV vol s, MOD    65.9 ml A4C: LV SV MOD A2C:   68.7 ml LV SV MOD A4C:   121.0 ml LV SV MOD BP:    69.3 ml RIGHT VENTRICLE RV S prime:     11.30 cm/s TAPSE (M-mode): 2.2 cm LEFT ATRIUM             Index        RIGHT ATRIUM           Index LA diam:        3.10 cm 1.41 cm/m   RA Area:     13.70 cm LA Vol (A2C):    51.7 ml 23.52 ml/m  RA Volume:   34.40 ml  15.65 ml/m LA Vol (A4C):   47.2 ml 21.47 ml/m LA Biplane Vol: 51.9 ml 23.61 ml/m  AORTIC VALVE AV Area (Vmax):    0.65 cm AV Area (Vmean):   0.55 cm AV Area (VTI):     0.52 cm AV Vmax:           429.33 cm/s AV Vmean:          331.000 cm/s AV VTI:            1.24 m AV Peak Grad:      73.7 mmHg AV Mean Grad:      48.7 mmHg LVOT Vmax:         88.30 cm/s LVOT Vmean:        58.300 cm/s LVOT VTI:          0.198 m LVOT/AV VTI ratio: 0.16  AORTA Ao Root diam: 3.10 cm Ao Asc diam:  3.50 cm MITRAL VALVE MV Area (PHT): 4.89 cm    SHUNTS MV Decel Time: 155 msec    Systemic VTI:  0.20 m MV E velocity: 81.80 cm/s  Systemic Diam: 2.00 cm MV A velocity: 79.70 cm/s MV E/A ratio:  1.03 Lyman Bishop MD Electronically signed by Lyman Bishop MD Signature Date/Time: 04/10/2022/12:08:40 PM    Final     Cardiac Studies    Patient Profile     56 y.o. male admitted for NSTEMI, found severe AS.   Assessment & Plan    1.NSTEMI/CAD - - Hs trop 489 >1146 - EKG showed nonspecific  inferolateral ST-T wave abnormalities - echo 03/2022 LVEF 50-55%, grade I dd, severe AS mean grade 49 AVA VTI 0.52 - LHC showed mild non-obstructive coronary artery disease  - medical therapy with ASA 81, vascepa, toprol 12.5, crestor 20.ACEi stopped due to soft bp's   2. Severe AS/bicuspid AV  echo 03/2022 LVEF 50-55%, grade I dd, severe AS mean grade 49 AVA VTI 0.52 -ongoing evaluation by CT surgery/structural heart - from ct surgery due to massive abdominal hernia with extensive mesh to xiphoid process limits sternotomy options.     For questions or updates, please contact [employer] Please consult [email or web address] for contact info under        Signed, Carlyle Dolly, MD  04/12/2022, 9:13 AM

## 2022-04-13 DIAGNOSIS — I350 Nonrheumatic aortic (valve) stenosis: Secondary | ICD-10-CM | POA: Diagnosis not present

## 2022-04-13 DIAGNOSIS — I214 Non-ST elevation (NSTEMI) myocardial infarction: Secondary | ICD-10-CM | POA: Diagnosis not present

## 2022-04-13 LAB — BASIC METABOLIC PANEL
Anion gap: 10 (ref 5–15)
BUN: 15 mg/dL (ref 6–20)
CO2: 20 mmol/L — ABNORMAL LOW (ref 22–32)
Calcium: 8.6 mg/dL — ABNORMAL LOW (ref 8.9–10.3)
Chloride: 111 mmol/L (ref 98–111)
Creatinine, Ser: 1.16 mg/dL (ref 0.61–1.24)
GFR, Estimated: >60 mL/min (ref >60)
Glucose, Bld: 95 mg/dL (ref 70–99)
Potassium: 4.4 mmol/L (ref 3.5–5.1)
Sodium: 141 mmol/L (ref 135–145)

## 2022-04-13 NOTE — Progress Notes (Signed)
Progress Note  Patient Name: Bob Ross Date of Encounter: 04/13/2022  [facility] Cardiologist: None   Subjective   No complaints  Inpatient Medications    Scheduled Meds:  aspirin EC  81 mg Oral Daily   enoxaparin (LOVENOX) injection  40 mg Subcutaneous Daily   icosapent Ethyl  2 g Oral BID   metoprolol succinate  12.5 mg Oral Daily   nicotine  21 mg Transdermal Daily   rosuvastatin  20 mg Oral Daily   senna-docusate  2 tablet Oral BID   sodium chloride flush  3 mL Intravenous Q12H   sodium chloride flush  3 mL Intravenous Q12H   Continuous Infusions:  sodium chloride     sodium chloride     PRN Meds: sodium chloride, sodium chloride, acetaminophen, albuterol, nitroGLYCERIN, ondansetron (ZOFRAN) IV, sodium chloride flush, sodium chloride flush, traMADol   Vital Signs    Vitals:   04/11/22 2047 04/12/22 0504 04/12/22 1315 04/12/22 2105  BP: 119/78 125/81 124/84 125/85  Pulse:  65 79   Resp: '16 16 17 16  '$ Temp: 98.3 F (36.8 C) 98.3 F (36.8 C) 98.1 F (36.7 C) 98 F (36.7 C)  TempSrc: Oral Oral Oral Oral  SpO2: 98%  100% 100%  Weight:      Height:        Intake/Output Summary (Last 24 hours) at 04/13/2022 0848 Last data filed at 04/12/2022 0915 Gross per 24 hour  Intake 6 ml  Output --  Net 6 ml      04/10/2022    6:06 AM 04/09/2022    1:49 PM 03/25/2022    8:02 AM  Last 3 Weights  Weight (lbs) 220 lb 9.6 oz 222 lb 223 lb 2 oz  Weight (kg) 100.064 kg 100.699 kg 101.209 kg      Telemetry    SR - Personally Reviewed  ECG    N/a - Personally Reviewed  Physical Exam   GEN: No acute distress.   Neck: No JVD Cardiac: RRR, 3/6 systoilc murmur rusb, no jvd  Respiratory: Clear to auscultation bilaterally. GI: Soft, nontender, non-distended  MS: No edema; No deformity. Neuro:  Nonfocal  Psych: Normal affect   Labs    High Sensitivity Troponin:   Recent Labs  Lab 04/09/22 1424 04/09/22 1617  TROPONINIHS 489* 1,146*      Chemistry Recent Labs  Lab 04/10/22 0142 04/11/22 0355 04/13/22 0408  NA 139 139 141  K 4.2 4.0 4.4  CL 111 107 111  CO2 24 24 20*  GLUCOSE 106* 90 95  BUN '18 16 15  '$ CREATININE 1.21 1.20 1.16  CALCIUM 8.2* 8.5* 8.6*  GFRNONAA >60 >60 >60  ANIONGAP 4* 8 10    Lipids  Recent Labs  Lab 04/10/22 0142  CHOL 126  TRIG 309*  HDL 36*  LDLCALC 28  CHOLHDL 3.5    Hematology Recent Labs  Lab 04/09/22 1424 04/10/22 0142 04/11/22 0355  WBC 11.4* 10.5 8.9  RBC 5.30 4.81 5.07  HGB 16.4 15.1 15.5  HCT 49.3 44.7 46.7  MCV 93.0 92.9 92.1  MCH 30.9 31.4 30.6  MCHC 33.3 33.8 33.2  RDW 14.8 14.9 14.6  PLT 117* 101* 94*   Thyroid No results for input(s): "TSH", "FREET4" in the last 168 hours.  BNPNo results for input(s): "BNP", "PROBNP" in the last 168 hours.  DDimer No results for input(s): "DDIMER" in the last 168 hours.   Radiology    No results found.  Cardiac Studies  Patient Profile     56 y.o. male admitted for NSTEMI, found severe AS.   Assessment & Plan    1.NSTEMI/CAD - - Hs trop 489 >1146 - EKG showed nonspecific  inferolateral ST-T wave abnormalities - echo 03/2022 LVEF 50-55%, grade I dd, severe AS mean grade 49 AVA VTI 0.52 - LHC showed mild non-obstructive coronary artery disease   - medical therapy with ASA 81, vascepa, toprol 12.5, crestor 20.ACEi stopped due to soft bp's     2. Severe AS/bicuspid AV  echo 03/2022 LVEF 50-55%, grade I dd, severe AS mean grade 49 AVA VTI 0.52 -ongoing evaluation by CT surgery/structural heart - from ct surgery due to massive abdominal hernia with extensive mesh to xiphoid process limits sternotomy options.  - completed dental xray, waiting on PFTs - f/u ct surgery/structural heart recs Monday     For questions or updates, please contact [place] Please consult [email or web address] for contact info under        Signed, Carlyle Dolly, MD  04/13/2022, 8:48 AM

## 2022-04-13 NOTE — Progress Notes (Signed)
  [facility]   MULTIDISCIPLINARY HEART VALVE TEAM   Left message with on-call IP dentist on [place], Dr. Haig Prophet. Awaiting call back for pre TAVR evaluation.    Kathyrn Drown NP-C Structural Heart Team  Pager: [phone] Phone: [phone]

## 2022-04-14 ENCOUNTER — Inpatient Hospital Stay (HOSPITAL_COMMUNITY): Payer: BC Managed Care – PPO

## 2022-04-14 DIAGNOSIS — K0530 Chronic periodontitis, unspecified: Secondary | ICD-10-CM

## 2022-04-14 DIAGNOSIS — I214 Non-ST elevation (NSTEMI) myocardial infarction: Secondary | ICD-10-CM | POA: Diagnosis not present

## 2022-04-14 DIAGNOSIS — K029 Dental caries, unspecified: Secondary | ICD-10-CM

## 2022-04-14 DIAGNOSIS — I350 Nonrheumatic aortic (valve) stenosis: Secondary | ICD-10-CM | POA: Diagnosis not present

## 2022-04-14 DIAGNOSIS — Z01818 Encounter for other preprocedural examination: Secondary | ICD-10-CM

## 2022-04-14 DIAGNOSIS — K036 Deposits [accretions] on teeth: Secondary | ICD-10-CM

## 2022-04-14 LAB — BASIC METABOLIC PANEL
Anion gap: 11 (ref 5–15)
BUN: 16 mg/dL (ref 6–20)
CO2: 22 mmol/L (ref 22–32)
Calcium: 9.3 mg/dL (ref 8.9–10.3)
Chloride: 107 mmol/L (ref 98–111)
Creatinine, Ser: 1.03 mg/dL (ref 0.61–1.24)
GFR, Estimated: >60 mL/min (ref >60)
Glucose, Bld: 106 mg/dL — ABNORMAL HIGH (ref 70–99)
Potassium: 4.4 mmol/L (ref 3.5–5.1)
Sodium: 140 mmol/L (ref 135–145)

## 2022-04-14 LAB — PULMONARY FUNCTION TEST
DL/VA % pred: 60 %
DL/VA: 2.62 ml/min/mmHg/L
DLCO cor % pred: 49 %
DLCO cor: 14.78 ml/min/mmHg
DLCO unc % pred: 51 %
DLCO unc: 15.14 ml/min/mmHg
FEF 25-75 Post: 1.02 L/sec
FEF 25-75 Pre: 0.48 L/sec
FEF2575-%Change-Post: 114 %
FEF2575-%Pred-Post: 30 %
FEF2575-%Pred-Pre: 14 %
FEV1-%Change-Post: 25 %
FEV1-%Pred-Post: 50 %
FEV1-%Pred-Pre: 40 %
FEV1-Post: 1.97 L
FEV1-Pre: 1.57 L
FEV1FVC-%Change-Post: 10 %
FEV1FVC-%Pred-Pre: 66 %
FEV6-%Change-Post: 18 %
FEV6-%Pred-Post: 64 %
FEV6-%Pred-Pre: 54 %
FEV6-Post: 3.18 L
FEV6-Pre: 2.68 L
FEV6FVC-%Change-Post: 4 %
FEV6FVC-%Pred-Post: 94 %
FEV6FVC-%Pred-Pre: 90 %
FVC-%Change-Post: 13 %
FVC-%Pred-Post: 68 %
FVC-%Pred-Pre: 60 %
FVC-Post: 3.49 L
FVC-Pre: 3.08 L
Post FEV1/FVC ratio: 56 %
Post FEV6/FVC ratio: 91 %
Pre FEV1/FVC ratio: 51 %
Pre FEV6/FVC Ratio: 87 %
RV % pred: 161 %
RV: 3.51 L
TLC % pred: 94 %
TLC: 6.8 L

## 2022-04-14 NOTE — Consult Note (Signed)
Department of Dental Medicine   Service Date:   04/14/2022 Admit Date:   04/09/2022  Patient Name:  Bob Ross Date of Birth:   1966-08-12 Medical Record Number: [id number]  Referring Provider:           Gilford Raid, MD  INPATIENT CONSULTATION PLAN/RECOMMENDATIONS   ASSESSMENT There are no current signs of acute odontogenic infection including abscess, edema or erythema, or suspicious lesion requiring biopsy.   Caries, defective restorations; periodontal concerns that include bone loss, accretions on teeth, inflamed and erythematous gingival tissue.  RECOMMENDATIONS Extractions of all indicated teeth to decrease the risk of perioperative and postoperative systemic infection and complications.    PLAN Discuss case with medical team and coordinate treatment as needed. O.R. on Thursday, August 24th for multiple extractions under general anesthesia, pending medical team's recommendations. Lovenox will need to be held for 24 hours prior to scheduled dental surgery and resumed 24 hours after.  Discussed in detail all treatment options and recommendations with the patient and they are agreeable to the plan.    Thank you for consulting with Hospital Dentistry and for the opportunity to participate in this patient's treatment.  Should you have any questions or concerns, please contact the [facility] at [phone].   04/14/2022  HISTORY OF PRESENT ILLNESS: Bob Ross is a very pleasant 56 y.o. male with history of HTN, hyperlipidemia, nonobstructive coronary artery disease, metastatic colon cancer in 2005 (treated with radical surgery and chemotherapy), cardiomyopathy (2021) and heavy tobacco use (cigarettes- since teenager; 1/2-1 ppd) who is currently admitted for new diagnosis of severe aortic stenosis and is anticipating heart surgery.   Hospital dentistry was consulted to complete a medically-necessary dental evaluation as part of the patient's pre-cardiac surgery  work-up.   DENTAL HISTORY: The patient reports that it has been a long time since he has last seen a dentist. He last went to the dentist for emergency tooth extraction years ago (does not remember exactly when).  The patient currently denies any dental/orofacial pain or sensitivity. Patient is able to manage oral secretions.  Patient denies dysphagia, odynophagia and dysphonia.   CHIEF COMPLAINT:  Preoperative dental consult.  Patient Active Problem List   Diagnosis Date Noted   Encounter for preoperative dental examination    Caries    Accretions on teeth    Chronic periodontitis    NSTEMI (non-ST elevated myocardial infarction) ([place]) 04/09/2022   Otitis externa 04/08/2022   Tobacco abuse 04/08/2022   Coronary artery disease 01/19/2020   SOB (shortness of breath) 01/19/2020   Rupture of left distal biceps tendon 08/13/2018   Rectus diastasis 04/25/2015   Chest pain 03/29/2014   Thrombocytopenia ([place]) 03/29/2014   Past Medical History:  Diagnosis Date   CAD (coronary artery disease)    Nonobstructive by cardiac catheterization 2021 [facility] cardiology   Cardiomyopathy [facility])    Colon cancer ([place])    Metastatic to liver and lymph nodes status post surgery and chemotherapy 2005   Erectile dysfunction    Essential hypertension    History of pneumonia    Incisional hernia    Mixed hyperlipidemia    Past Surgical History:  Procedure Laterality Date   COLON SURGERY     COLOSTOMY     COLOSTOMY REVERSAL     HERNIA REPAIR     LEFT HEART CATH AND CORONARY ANGIOGRAPHY N/A 04/09/2022   Procedure: LEFT HEART CATH AND CORONARY ANGIOGRAPHY;  Surgeon: Early Osmond, MD;  Location: [facility];  Service: Cardiovascular;  Laterality: N/A;   LIVER RESECTION     Allergies  Allergen Reactions   Tiotropium Bromide Monohydrate Anaphylaxis   Current Facility-Administered Medications  Medication Dose Route Frequency Provider Last Rate Last Admin   0.9 %  sodium chloride infusion   250 mL Intravenous PRN Ahmed Prima, [place] M, PA-C       0.9 %  sodium chloride infusion  250 mL Intravenous PRN Early Osmond, MD       acetaminophen (TYLENOL) tablet 650 mg  650 mg Oral Q4H PRN Early Osmond, MD   650 mg at 04/14/22 1014   albuterol (PROVENTIL) (2.5 MG/3ML) 0.083% nebulizer solution 2.5 mg  2.5 mg Nebulization Q6H PRN Ahmed Prima, [place] M, PA-C   2.5 mg at 04/14/22 1317   aspirin EC tablet 81 mg  81 mg Oral Daily Bernerd Pho M, PA-C   81 mg at 04/14/22 0939   enoxaparin (LOVENOX) injection 40 mg  40 mg Subcutaneous Daily Reino Bellis B, NP   40 mg at 04/14/22 5621   icosapent Ethyl (VASCEPA) 1 g capsule 2 g  2 g Oral BID Margie Billet, NP   2 g at 04/14/22 3086   metoprolol succinate (TOPROL-XL) 24 hr tablet 12.5 mg  12.5 mg Oral Daily Margie Billet, NP   12.5 mg at 04/14/22 0941   nicotine (NICODERM CQ - dosed in mg/24 hours) patch 21 mg  21 mg Transdermal Daily Ledora Bottcher, PA   21 mg at 04/14/22 5784   nitroGLYCERIN (NITROSTAT) SL tablet 0.4 mg  0.4 mg Sublingual Q5 min PRN Bernerd Pho M, PA-C   0.4 mg at 04/09/22 1451   ondansetron (ZOFRAN) injection 4 mg  4 mg Intravenous Q6H PRN Early Osmond, MD       rosuvastatin (CRESTOR) tablet 20 mg  20 mg Oral Daily Margie Billet, NP   20 mg at 04/14/22 0941   senna-docusate (Senokot-S) tablet 2 tablet  2 tablet Oral BID Margie Billet, NP   2 tablet at 04/14/22 0940   sodium chloride flush (NS) 0.9 % injection 3 mL  3 mL Intravenous Q12H Strader, [place] M, PA-C   3 mL at 04/14/22 0959   sodium chloride flush (NS) 0.9 % injection 3 mL  3 mL Intravenous PRN Bernerd Pho M, PA-C   3 mL at 04/13/22 2137   sodium chloride flush (NS) 0.9 % injection 3 mL  3 mL Intravenous Q12H Early Osmond, MD   3 mL at 04/14/22 0946   sodium chloride flush (NS) 0.9 % injection 3 mL  3 mL Intravenous PRN Early Osmond, MD       traMADol Veatrice Bourbon) tablet 50 mg  50 mg Oral Q6H PRN Erma Heritage, [place]        LABS: Lab  Results  Component Value Date   WBC 8.9 04/11/2022   HGB 15.5 04/11/2022   HCT 46.7 04/11/2022   MCV 92.1 04/11/2022   PLT 94 (L) 04/11/2022      Component Value Date/Time   NA 140 04/14/2022 0910   K 4.4 04/14/2022 0910   CL 107 04/14/2022 0910   CO2 22 04/14/2022 0910   GLUCOSE 106 (H) 04/14/2022 0910   BUN 16 04/14/2022 0910   CREATININE 1.03 04/14/2022 0910   CALCIUM 9.3 04/14/2022 0910   GFRNONAA >60 04/14/2022 0910   GFRAA >60 10/09/2017 1022   No results found for: "INR", "PROTIME" No results found for: "PTT"  Social History   Socioeconomic  History   Marital status: Married    Spouse name: Not on file   Number of children: Not on file   Years of education: Not on file   Highest education level: Not on file  Occupational History   Not on file  Tobacco Use   Smoking status: Every Day    Packs/day: 2.00    Types: Cigarettes   Smokeless tobacco: Never  Substance and Sexual Activity   Alcohol use: Yes    Comment: Occasional   Drug use: No   Sexual activity: Not on file  Other Topics Concern   Not on file  Social History Narrative   Not on file   Social Determinants of Health   Financial Resource Strain: Not on file  Food Insecurity: Not on file  Transportation Needs: Not on file  Physical Activity: Not on file  Stress: Not on file  Social Connections: Not on file  Intimate Partner Violence: Not on file   Family History  Problem Relation Age of Onset   AAA (abdominal aortic aneurysm) Mother    Cancer Father     REVIEW OF SYSTEMS:  Reviewed with the patient as per HPI. PSYCH:  Patient denies having dental phobia.     VITAL SIGNS: BP 130/72 (BP Location: Left Arm)   Pulse 69   Temp 98.3 F (36.8 C) (Oral)   Resp 14   Ht '5\' 11"'$  (1.803 m)   Wt 100.1 kg   SpO2 97%   BMI 30.77 kg/m      PHYSICAL EXAM: GENERAL:  Well-developed, comfortable and in no apparent distress. NEUROLOGICAL:  Alert and oriented to person, place and   time. EXTRAORAL:  Facial symmetry present without any edema or erythema.  No swelling or lymphadenopathy.  TMJ asymptomatic without clicks or crepitations.  INTRAORAL:  Soft tissues appear well-perfused and mucous membranes moist.  FOM and vestibules soft and not raised. Oral cavity without mass or lesion. No signs of infection, parulis, sinus tract, edema or erythema evident upon exam.     DENTAL EXAM: Clinical findings charted.    OVERALL IMPRESSION:  Poor remaining dentition.       ORAL HYGIENE:  Poor  PERIODONTAL:  Inflamed and erythematous gingival tissue.   Generalized plaque and calculus accumulation. [+] Recession:  Generalized, moderate-severe CARIES:  Rampant decay in all 4 quadrants; #5, #6, #7, #8, #9, #10, #11, #14, #18, #20, #21, #22, #24, #25, #26, #27, #28 and #31.  #14, #18 and #31 have deep decay approximating the pulp. DEFECTIVE RESTORATIONS:  #14, #18 and #31 existing amalgam restorations with recurrent decay PROSTHODONTICS:  Patient denies any history of wearing partial dentures. OCCLUSION:  Unable to adequately assess molar occlusion.  Non-functional teeth:  #20, #21, #31   RADIOGRAPHIC EXAM:   04/11/2022 Orthopantogram reviewed and interpreted. Condyles seated bilaterally in fossas.  No evidence of abnormal pathology.  All visualized osseous structures appear WNL. Potential radiolucency surrounding apices of teeth #'s 24, 25 & 26.  #18 & #31 appear supra-erupted. Missing teeth #'s 1-4, 12, 13, 15, 16, 17, 19, 29, 30 & 32.  Existing restorations on teeth #'s 14, 18 & 31.  Caries-  #453M, #53M&D, #171M&D, #8M&D, #27M&D, #71M&D, #11D, #14 & #18 have severe decay approximating the pulp, #75M, #21D, #22D, #2453M&D, #19M, #31 has severe decay approximating the pulp.   ASSESSMENT:  1.  Severe aortic stenosis 2.  Preoperative dental exam 3.  Missing teeth 4.  Caries 5.  Defective restorations 6.  Chronic periodontitis  7.  Chronic apical periodontitis 8.  Accretions on  teeth 9.  Gingival recession, generalized 10. Postoperative bleeding risk   PLAN AND RECOMMENDATIONS: I discussed the risks, benefits, and complications of various scenarios with the patient in relationship to their medical and dental conditions, which included systemic infection such as endocarditis, bacteremia or other serious issues that could potentially occur either before, during or after their anticipated heart surgery if dental/oral concerns are not addressed.  I explained that if any chronic or acute dental/oral infection(s) are addressed and subsequently not maintained following medical optimization and recovery, their risk of the previously mentioned complications are just as high and could potentially occur postoperatively.  I explained all significant findings of the dental consultation with the patient including multiple teeth with cavities (some teeth with deep cavities approaching the nerve of his teeth), bone loss and calculus or tartar build-up leading to inflamed and irritated gums/tissue causing chronic inflammation, and the recommended care including extractions of all teeth with severe decay (at least teeth numbers 14, 18, 31 and likely some anterior teeth on the top and bottom) that could become acutely infected soon in order to optimize them for heart surgery from a dental standpoint.  The patient verbalized understanding of all findings, discussion, and recommendations. We then discussed various treatment options to include no treatment, multiple extractions with alveoloplasty, pre-prosthetic surgery as indicated, periodontal therapy, dental restorations, root canal therapy, crown and bridge therapy, implant therapy, and replacement of missing teeth as indicated.  I also discussed the treatment option of extracting all remaining maxillary teeth due to the extent of decay and the fact that he will not have any posterior teeth remaining for a partial denture in the future.  The patient  verbalized understanding of all options, and currently wishes to proceed with extractions of all hopeless or chronically infected teeth as recommended/indicated and will consider extractions of all remaining maxillary teeth to prepare him for dentures in the future.  Plan to discuss all findings and recommendations with medical team and coordinate future care as needed.  Potential O.R. date if medical team agrees with extractions before heart surgery would be on Thursday, August 24th.  The patient will need to have his Lovenox held 24 hours prior to his surgery and resumed 24 hours after surgery.  The patient will need to establish care at a dental office of his choice for routine dental care including replacement of missing teeth as needed, cleanings and exams.  All questions and concerns were invited and addressed.  The patient tolerated today's visit well.  - Meta Kroenke B. Benson [place], DMD

## 2022-04-14 NOTE — Progress Notes (Addendum)
Progress Note  Patient Name: Bob Ross Date of Encounter: 04/14/2022  [facility] Cardiologist: New (Dr. Domenic Polite)  Subjective   No acute overnight events. No complaints this morning. No chest pain or new/worsening shortness of breath. Waiting final recommendations from Structural Heart Team.  Inpatient Medications    Scheduled Meds:  aspirin EC  81 mg Oral Daily   enoxaparin (LOVENOX) injection  40 mg Subcutaneous Daily   icosapent Ethyl  2 g Oral BID   metoprolol succinate  12.5 mg Oral Daily   nicotine  21 mg Transdermal Daily   rosuvastatin  20 mg Oral Daily   senna-docusate  2 tablet Oral BID   sodium chloride flush  3 mL Intravenous Q12H   sodium chloride flush  3 mL Intravenous Q12H   Continuous Infusions:  sodium chloride     sodium chloride     PRN Meds: sodium chloride, sodium chloride, acetaminophen, albuterol, nitroGLYCERIN, ondansetron (ZOFRAN) IV, sodium chloride flush, sodium chloride flush, traMADol   Vital Signs    Vitals:   04/13/22 0900 04/13/22 1300 04/13/22 2013 04/14/22 0440  BP: 121/80 130/88 121/84 119/81  Pulse: 76 66 62 69  Resp:  '15 16 16  '$ Temp:  97.8 F (36.6 C) 98.8 F (37.1 C) 98.2 F (36.8 C)  TempSrc:  Oral Oral Oral  SpO2:  100%  97%  Weight:      Height:       No intake or output data in the 24 hours ending 04/14/22 0847    04/10/2022    6:06 AM 04/09/2022    1:49 PM 03/25/2022    8:02 AM  Last 3 Weights  Weight (lbs) 220 lb 9.6 oz 222 lb 223 lb 2 oz  Weight (kg) 100.064 kg 100.699 kg 101.209 kg      Telemetry    Normal sinus rhythm with occasional PVCs. Resting rates in the 60s. - Personally Reviewed  ECG    No new ECG tracing today. - Personally Reviewed  Physical Exam   Physical Exam per MD:  GEN: No acute distress.   Neck: No JVD. Cardiac: RRR. 2/6 systolic murmur. No rubs or gallops.  Respiratory: Clear to auscultation bilaterally. No wheezes, rhonchi, or rales. GI: Soft, non-distended, and  non-tender. MS: No lower extremity edema. No deformity. Neuro:  No focal deficits.  Psych: Normal affect. Responds appropriately.  Labs    High Sensitivity Troponin:   Recent Labs  Lab 04/09/22 1424 04/09/22 1617  TROPONINIHS 489* 1,146*     Chemistry Recent Labs  Lab 04/10/22 0142 04/11/22 0355 04/13/22 0408  NA 139 139 141  K 4.2 4.0 4.4  CL 111 107 111  CO2 24 24 20*  GLUCOSE 106* 90 95  BUN '18 16 15  '$ CREATININE 1.21 1.20 1.16  CALCIUM 8.2* 8.5* 8.6*  GFRNONAA >60 >60 >60  ANIONGAP 4* 8 10    Lipids  Recent Labs  Lab 04/10/22 0142  CHOL 126  TRIG 309*  HDL 36*  LDLCALC 28  CHOLHDL 3.5    Hematology Recent Labs  Lab 04/09/22 1424 04/10/22 0142 04/11/22 0355  WBC 11.4* 10.5 8.9  RBC 5.30 4.81 5.07  HGB 16.4 15.1 15.5  HCT 49.3 44.7 46.7  MCV 93.0 92.9 92.1  MCH 30.9 31.4 30.6  MCHC 33.3 33.8 33.2  RDW 14.8 14.9 14.6  PLT 117* 101* 94*   Thyroid No results for input(s): "TSH", "FREET4" in the last 168 hours.  BNPNo results for input(s): "BNP", "PROBNP" in the  last 168 hours.  DDimer No results for input(s): "DDIMER" in the last 168 hours.   Radiology    No results found.  Cardiac Studies   Left Cardiac Catheterization 04/09/2022: 1.  Mild obstructive coronary artery disease. 2.  Severely elevated LVEDP of 29 mmHg. 3.  Heavily calcified aortic valve with a mean gradient of 52 mmHg and a peak to peak gradient of 62 mmHg consistent with severe aortic stenosis; aortic waveforms demonstrated parvus and tardus. 4.  Preserved left ventricular ejection fraction.   Recommendation: Echocardiogram to evaluate for severe aortic stenosis which will inform therapy moving forward.  Diagnostic Dominance: Right  _______________  Echocardiogram 04/10/2022: Impressions: 1. Left ventricular ejection fraction, by estimation, is 50 to 55%. Left  ventricular ejection fraction by 2D MOD biplane is 52.6 %. The left  ventricle has low normal function. The left  ventricle demonstrates  regional wall motion abnormalities (see  scoring diagram/findings for description). The left ventricular internal  cavity size was mildly dilated. Left ventricular diastolic parameters are  consistent with Grade I diastolic dysfunction (impaired relaxation).  Elevated left ventricular  end-diastolic pressure. There is mild hypokinesis of the left ventricular,  basal-mid inferior wall and inferolateral wall.   2. Right ventricular systolic function is normal. The right ventricular  size is normal.   3. The mitral valve is grossly normal. Trivial mitral valve  regurgitation.   4. The aortic valve is calcified. There is moderate calcification of the  aortic valve. Aortic valve regurgitation is not visualized. Severe aortic  valve stenosis. Aortic valve area, by VTI measures 0.52 cm. Aortic valve  mean gradient measures 48.7  mmHg. Aortic valve Vmax measures 4.29 m/s. DI is 0.16   5. The inferior vena cava is normal in size with greater than 50%  respiratory variability, suggesting right atrial pressure of 3 mmHg.   Comparison(s): No prior Echocardiogram.  _______________  Coronary CTA 04/10/2022: 1. Bicuspid aortic valve with fusion of right and noncoronary cusps. Severe calcifications (AV calcium score 4864) 2. Aortic annulus measures 74m x 22mwith perimeter 8653mnd area 579 mm^2. Moderate annular calcifications adjacent to right coronary cusp and extending into LVOT. Annular measurements are suitable for delivery of 51m44mwards Sapien 3 valve 3. Sufficient coronary to annulus distance, measuring 14mm60mleft main and 14mm 43mCA 4. Optimum Fluoroscopic Angle for Delivery:  RAO 3 CRA 9 5. Coronary calcium score 757 (98th percentile)  Patient Profile     56 y.o71male with a history of non-obstructive CAD on cardiac catheterization in 2021, non-ischemic cardiomyopathy with EF of 40% in 2021, hypertension, hyperlipidemia, COPD, and metastatic colon cancer  s/p surgery and chemotherapy in 2005 now in remission who [facility] with severe chest pain and ruled in for NSTEMI. [facility] to only have mild CAD but severe aortic stenosis.  Assessment & Plan    NSTEMI Non-Obstructive CAD Presented with chest pain. High-sensitivity troponin 489 >> 1,146. LHC on 8/16 showed mild non-obstructive CAD but severe aortic stenosis and severely elevated LVEDP. Echo showed LVEF of 50-55% with hypokinesis of the anterolateral wall and posterior wall.  - No chest pain. - Continue Aspirin '81mg'$  daily, Toprol-XL 12.'5mg'$  daily, and Crestor '20mg'$  daily.  Severe Aortic Stenosis LHC showed severe aortic stenosis with mean gradient of 52 mmHg and peak gradient of 62 mmHg. Echo showed low normal LV function with severe aortic stenosis with mean gradient of 48.7  mmHg and VTI of 0.52 cm^2. Coronary CTA showed bicuspid aortic valve with severe calcifications and fusion of right and noncoronary cusps. - Patient has been seen by Structural Heart Team. PFTs and dental orthopantogram have been ordered. Inpatient Dental evaluation pending. Also waiting to be seen by Dr. Cyndia Bent. If he is not able to have surgery tomorrow, suspect he will be able to go home and come back for this. Will wait for Dr. Vivi Martens recommendations.  Non-Ischemic Cardiomyopathy LVEF as low as 40% in 2021. Echo this admission shows LVEF of 50-55% and grade 1 diastolic dysfunction. LVEDP was 29 on cardiac catheterization and he received one dose of IV Lasix following this with good response. - Euvolemic on exam. - No additional diuresis needed at this time. - Continue Toprol-XL 12.'5mg'$  daily. - Home Lisinopril stopped for now. Wanting to avoid hypotension in setting of severe AS. - Continue to monitor volume status closely.  Hypertension BP well controlled. - Continue low dose Toprol-XL 12.'5mg'$  daily. - Home  Lisinopril stopped for now. Wanting to avoid hypotension in setting of severe AS.  Hyperlipidemia Lipid panel this admission: Total Cholesterol 126, Triglycerides 309, HDL 36, LDL 28. - On Lipitor '40mg'$  daily at home but switched to Crestor '20mg'$  daily here. Continue.  - Also started him on Vascepa 2g twice daily. Continue. - Will need repeat lipid panel and LFTs in 6-8 weeks.  Pre-Diabetes Hemoglobin A1c 6.2 this admission.  Tobacco Abuse Completed cessation recommended. Patient/wife seem determined to quit. Wife states all cigarettes have been removed from the home. - Nicotine patches have been prescribed.    For questions or updates, please contact [facility] Please consult [email or web address] for contact info under        Signed, Darreld Mclean, PA-C  04/14/2022, 8:47 AM    Agree with note by Sande Rives, PA-C  Patient admitted for non-STEMI.  Coronary angiography showed nonobstructive disease.  He has severe aortic stenosis with preserved LV function.  Other problems as outlined.  He has been clinically stable and is euvolemic on exam with a soft outflow tract murmur.  He has been evaluated by the structural heart team.  Not a candidate for open SAVR because of prior abdominal mesh procedure and difficulty doing a sternotomy.  For PFTs and dental eval.  Dr. Cyndia Bent to see hopefully today.  Plan is for discharge home and schedule outpatient TAVR next Tuesday.   Lorretta Harp, M.D., [place], [facility], [place] [address] [place], [place]  [address]  [phone] 04/14/2022 10:02 AM

## 2022-04-15 DIAGNOSIS — I214 Non-ST elevation (NSTEMI) myocardial infarction: Secondary | ICD-10-CM | POA: Diagnosis not present

## 2022-04-15 DIAGNOSIS — I350 Nonrheumatic aortic (valve) stenosis: Secondary | ICD-10-CM | POA: Diagnosis not present

## 2022-04-15 LAB — BASIC METABOLIC PANEL
Anion gap: 9 (ref 5–15)
BUN: 19 mg/dL (ref 6–20)
CO2: 23 mmol/L (ref 22–32)
Calcium: 9.2 mg/dL (ref 8.9–10.3)
Chloride: 109 mmol/L (ref 98–111)
Creatinine, Ser: 1.2 mg/dL (ref 0.61–1.24)
GFR, Estimated: >60 mL/min (ref >60)
Glucose, Bld: 95 mg/dL (ref 70–99)
Potassium: 4.7 mmol/L (ref 3.5–5.1)
Sodium: 141 mmol/L (ref 135–145)

## 2022-04-15 NOTE — Progress Notes (Addendum)
[place] VALVE TEAM  Patient Name: Bob Ross Date of Encounter: 04/15/2022  Admit date: 04/09/2022  Primary Care Provider: [facility], [place] [facility] Cardiologist: Quay Burow, MD  [facility] Electrophysiologist:  None   [facility] Problem List     Principal Problem:   Non-ST elevation (NSTEMI) myocardial infarction [facility]) Active Problems:   Chest pain   Thrombocytopenia (HCC)   Tobacco abuse   Caries   Accretions on teeth   Chronic periodontitis   Severe aortic stenosis     Subjective   No complaints. No chest pain or shortness of breath. Agreeable to plan for tentative TAVR on Thursday if schedule allows  Inpatient Medications    Scheduled Meds:  aspirin EC  81 mg Oral Daily   enoxaparin (LOVENOX) injection  40 mg Subcutaneous Daily   icosapent Ethyl  2 g Oral BID   metoprolol succinate  12.5 mg Oral Daily   nicotine  21 mg Transdermal Daily   rosuvastatin  20 mg Oral Daily   senna-docusate  2 tablet Oral BID   sodium chloride flush  3 mL Intravenous Q12H   sodium chloride flush  3 mL Intravenous Q12H   Continuous Infusions:  sodium chloride     sodium chloride     PRN Meds: sodium chloride, sodium chloride, acetaminophen, albuterol, nitroGLYCERIN, ondansetron (ZOFRAN) IV, sodium chloride flush, sodium chloride flush, traMADol   Vital Signs    Vitals:   04/14/22 0440 04/14/22 0937 04/14/22 2122 04/15/22 0910  BP: 119/81 130/72 121/80 126/86  Pulse: 69  (!) 56 83  Resp: '16 14 16   '$ Temp: 98.2 F (36.8 C) 98.3 F (36.8 C) 98.2 F (36.8 C)   TempSrc: Oral Oral Oral   SpO2: 97%  96%   Weight:      Height:       No intake or output data in the 24 hours ending 04/15/22 1006 Filed Weights   04/09/22 1349 04/10/22 0606  Weight: 100.7 kg 100.1 kg    Physical Exam    GEN: Well nourished, well developed, in no acute distress.  HEENT: Grossly normal.  Neck: Supple, no JVD, carotid bruits, or  masses. Cardiac: RRR, 3/6 SEM @ RUSB. No rubs, or gallops. No clubbing, cyanosis, edema.   Respiratory:  Respirations regular and unlabored, clear to auscultation bilaterally. GI: Soft, nontender, nondistended, BS + x 4. MS: no deformity or atrophy. Skin: warm and dry, no rash. Neuro:  Strength and sensation are intact. Psych: AAOx3.  Normal affect.  Labs    CBC No results for input(s): "WBC", "NEUTROABS", "HGB", "HCT", "MCV", "PLT" in the last 72 hours. Basic Metabolic Panel Recent Labs    04/14/22 0910 04/15/22 0358  NA 140 141  K 4.4 4.7  CL 107 109  CO2 22 23  GLUCOSE 106* 95  BUN 16 19  CREATININE 1.03 1.20  CALCIUM 9.3 9.2   Liver Function Tests No results for input(s): "AST", "ALT", "ALKPHOS", "BILITOT", "PROT", "ALBUMIN" in the last 72 hours. No results for input(s): "LIPASE", "AMYLASE" in the last 72 hours. Cardiac Enzymes No results for input(s): "CKTOTAL", "CKMB", "CKMBINDEX", "TROPONINI" in the last 72 hours. BNP Invalid input(s): "POCBNP" D-Dimer No results for input(s): "DDIMER" in the last 72 hours. Hemoglobin A1C No results for input(s): "HGBA1C" in the last 72 hours. Fasting Lipid Panel No results for input(s): "CHOL", "HDL", "LDLCALC", "TRIG", "CHOLHDL", "LDLDIRECT" in the last 72 hours. Thyroid Function Tests No results for input(s): "TSH", "  T4TOTAL", "T3FREE", "THYROIDAB" in the last 72 hours.  Invalid input(s): "FREET3"  Telemetry    sinus - Personally Reviewed  ECG    No new tracing - Personally Reviewed  Radiology    No results found.  Cardiac Studies   Left Cardiac Catheterization 04/09/2022: 1.  Mild obstructive coronary artery disease. 2.  Severely elevated LVEDP of 29 mmHg. 3.  Heavily calcified aortic valve with a mean gradient of 52 mmHg and a peak to peak gradient of 62 mmHg consistent with severe aortic stenosis; aortic waveforms demonstrated parvus and tardus. 4.  Preserved left ventricular ejection fraction.    Recommendation: Echocardiogram to evaluate for severe aortic stenosis which will inform therapy moving forward.   Diagnostic Dominance: Right  _______________   Echocardiogram 04/10/2022: Impressions: 1. Left ventricular ejection fraction, by estimation, is 50 to 55%. Left  ventricular ejection fraction by 2D MOD biplane is 52.6 %. The left  ventricle has low normal function. The left ventricle demonstrates  regional wall motion abnormalities (see  scoring diagram/findings for description). The left ventricular internal  cavity size was mildly dilated. Left ventricular diastolic parameters are  consistent with Grade I diastolic dysfunction (impaired relaxation).  Elevated left ventricular  end-diastolic pressure. There is mild hypokinesis of the left ventricular,  basal-mid inferior wall and inferolateral wall.   2. Right ventricular systolic function is normal. The right ventricular  size is normal.   3. The mitral valve is grossly normal. Trivial mitral valve  regurgitation.   4. The aortic valve is calcified. There is moderate calcification of the  aortic valve. Aortic valve regurgitation is not visualized. Severe aortic  valve stenosis. Aortic valve area, by VTI measures 0.52 cm. Aortic valve  mean gradient measures 48.7  mmHg. Aortic valve Vmax measures 4.29 m/s. DI is 0.16   5. The inferior vena cava is normal in size with greater than 50%  respiratory variability, suggesting right atrial pressure of 3 mmHg.   Comparison(s): No prior Echocardiogram.   _______________   Coronary CTA 04/10/2022: 1. Bicuspid aortic valve with fusion of right and noncoronary cusps. Severe calcifications (AV calcium score 4864) 2. Aortic annulus measures 9m x 247mwith perimeter 8687mnd area 579 mm^2. Moderate annular calcifications adjacent to right coronary cusp and extending into LVOT. Annular measurements are suitable for delivery of 95m67mwards Sapien 3 valve 3. Sufficient  coronary to annulus distance, measuring 14mm37mleft main and 14mm 83mCA 4. Optimum Fluoroscopic Angle for Delivery:  RAO 3 CRA 9 5. 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   Assessment & Plan    NTSTEMI: presented with chest pain. High-sensitivity troponin 489 >> 1,146. LHC on 8/16 showed mild non-obstructive CAD but severe aortic stenosis and severely elevated LVEDP. Echo showed LVEF of 50-55% with hypokinesis of the anterolateral wall and posterior wall.  - No recurrent chest pain. - Continue Aspirin  $'81mg'D$  daily, Toprol-XL 12.'5mg'$  daily, and Crestor '20mg'$  daily.  Severe aortic stenosis: echo this admission showed EF 50-55% and severe AS with a mean grad 48.7 mmHg, peak grad 73.7 mmHg, AVA 0.55 cm2, DVI 0.16, SVI 28. He has been evaluated by our multidisciplinary valve team and options of TAVR vs SAVR via mini thoracotomy. Given numerous abdominal surgeries with mesh and prolonged healing, severe COPD by PFTs and patient/family preference, we think he would be best suited by TAVR. We are trying to arrange this for Thursday afternoon.   Poor dentition: seen by Dr. Benson Norway and needs a near full mouth extraction. This will be deferred after TAVR given unstable presentation with NSTEMI.   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  Echo this admission shows LVEF of 50-55% and grade 1 diastolic dysfunction. LVEDP was 29 on cardiac catheterization and he received one dose of IV Lasix following this with good response. Continue home Toprol XL 12.'5mg'$  daily. Home lisinopril on hold to avoid hypotension in the setting of severe AS.   HTN: BP well controlled currently.   HLD: Lipid panel this admission: Total Cholesterol 126, Triglycerides 309, HDL 36, LDL 28. - On Lipitor '40mg'$  daily at home but switched to Crestor '20mg'$  daily here. Also started him on Vascepa 2g twice daily. - Will need repeat lipid panel and LFTs in 6-8 weeks.  Pre-Diabetes: hemoglobin A1c 6.2 this admission.    Tobacco Abuse: continue nicotine patches.    SignedAngelena Form, PA-C  04/15/2022, 10:06 AM  Pager [phone]  Patient seen, examined. Available data reviewed. Agree with findings, assessment, and plan as outlined by Nell Range, PA-C.  The patient is independently interviewed and examined.  He is alert, oriented, no distress.  HEENT is normal, JVP is normal, lungs are clear, heart is regular rate and rhythm with a 3/6 harsh systolic murmur at the right upper sternal border, abdomen is soft, large ventral hernia noted, extremities have no edema.  All available data has been reviewed.  We had a multidisciplinary team discussion about the patient this morning.  We agree that TAVR is indicated and that he is too high risk for conventional heart surgery.  Considering the patient's critical aortic stenosis and presentation with non-STEMI with chest discomfort over prolonged period at rest, we do not feel like he should have full dental extraction prior to TAVR.  Favor moving forward with TAVR during this admission.  We are working on the logistics of doing his procedure on Thursday afternoon.  The procedure has been reviewed in detail with the patient who understands the risks, potential benefits, and alternatives.  He is eager to move forward with  treatment.  Sherren Mocha, M.D. 04/15/2022 1:30 PM

## 2022-04-15 NOTE — Progress Notes (Signed)
6 Days Post-Op Procedure(s) (LRB): LEFT HEART CATH AND CORONARY ANGIOGRAPHY (N/A) Subjective: No complaints  Objective: Vital signs in last 24 hours: Temp:  [98.2 F (36.8 C)] 98.2 F (36.8 C) (08/21 2122) Pulse Rate:  [56-83] 83 (08/22 0910) Cardiac Rhythm: Normal sinus rhythm (08/22 0837) Resp:  [16] 16 (08/21 2122) BP: (121-126)/(80-86) 126/86 (08/22 0910) SpO2:  [96 %] 96 % (08/21 2122)  Hemodynamic parameters for last 24 hours:    Intake/Output from previous day: No intake/output data recorded. Intake/Output this shift: No intake/output data recorded.  Lab Results: No results for input(s): "WBC", "HGB", "HCT", "PLT" in the last 72 hours. BMET:  Recent Labs    04/14/22 0910 04/15/22 0358  NA 140 141  K 4.4 4.7  CL 107 109  CO2 22 23  GLUCOSE 106* 95  BUN 16 19  CREATININE 1.03 1.20  CALCIUM 9.3 9.2    PT/INR: No results for input(s): "LABPROT", "INR" in the last 72 hours. ABG No results found for: "PHART", "HCO3", "TCO2", "ACIDBASEDEF", "O2SAT" CBG (last 3)  No results for input(s): "GLUCAP" in the last 72 hours.  Assessment/Plan:  We discussed his case at our multidisciplinary heart team meeting this morning and the consensus is that he is an acceptable candidate for TAVR. His gated cardiac CTA shows anatomy suitable for a 29 mm Sapien valve with adequate femoral vascular access. His risk for open surgery is increased due to his severe COPD, active smoking, one functioning kidney, and multiple abdominal surgeries related to his hx of metastatic colon cancer. He would like to proceed with TAVR. Dentistry did not see any signs of dental abscess although he has significant dental and periodontal disease that will require continued followup and treatment. I don't think he needs to have his teeth extracted preop. We will plan to do transfemoral TAVR on Thursday afternoon. We discussed complications that might develop including but not limited to risks of death, stroke,  paravalvular leak, aortic dissection or other major vascular complications, aortic annulus rupture, device embolization, cardiac rupture or perforation, mitral regurgitation, acute myocardial infarction, arrhythmia, heart block or bradycardia requiring permanent pacemaker placement, congestive heart failure, respiratory failure, renal failure, pneumonia, infection, other late complications related to structural valve deterioration or migration, or other complications that might ultimately cause a temporary or permanent loss of functional independence or other long term morbidity. The patient provides full informed consent for the procedure as described and all questions were answered.    Gaye Pollack, MD   LOS: 5 days    Gaye Pollack 04/15/2022

## 2022-04-16 ENCOUNTER — Inpatient Hospital Stay (HOSPITAL_COMMUNITY): Payer: BC Managed Care – PPO

## 2022-04-16 MED ORDER — MAGNESIUM SULFATE 50 % IJ SOLN
40.0000 meq | INTRAMUSCULAR | Status: DC
  Filled 2022-04-16: qty 9.85

## 2022-04-16 MED ORDER — BISACODYL 5 MG PO TBEC
5.0000 mg | DELAYED_RELEASE_TABLET | Freq: Once | ORAL | Status: AC
  Administered 2022-04-16: 5 mg via ORAL
  Filled 2022-04-16: qty 1

## 2022-04-16 MED ORDER — TEMAZEPAM 15 MG PO CAPS: 15.0000 mg | ORAL_CAPSULE | Freq: Once | ORAL | Status: DC | PRN

## 2022-04-16 MED ORDER — NOREPINEPHRINE 4 MG/250ML-% IV SOLN
0.0000 ug/min | INTRAVENOUS | Status: AC
  Administered 2022-04-17: 1 ug/min via INTRAVENOUS
  Filled 2022-04-16: qty 250

## 2022-04-16 MED ORDER — CEFAZOLIN SODIUM-DEXTROSE 2-4 GM/100ML-% IV SOLN
2.0000 g | INTRAVENOUS | Status: AC
  Administered 2022-04-17: 2 g via INTRAVENOUS
  Filled 2022-04-16: qty 100

## 2022-04-16 MED ORDER — HEPARIN 30,000 UNITS/1000 ML (OHS) CELLSAVER SOLUTION
Status: DC
  Filled 2022-04-16: qty 1000

## 2022-04-16 MED ORDER — CHLORHEXIDINE GLUCONATE 4 % EX LIQD
1.0000 | Freq: Once | CUTANEOUS | Status: AC
  Administered 2022-04-17: 1 via TOPICAL
  Filled 2022-04-16 (×2): qty 15

## 2022-04-16 MED ORDER — DEXMEDETOMIDINE HCL IN NACL 400 MCG/100ML IV SOLN
0.1000 ug/kg/h | INTRAVENOUS | Status: AC
  Administered 2022-04-17: 100.12 ug via INTRAVENOUS
  Administered 2022-04-17: 1 ug/kg/h via INTRAVENOUS
  Filled 2022-04-16: qty 100

## 2022-04-16 MED ORDER — CHLORHEXIDINE GLUCONATE 0.12 % MT SOLN
15.0000 mL | Freq: Once | OROMUCOSAL | Status: AC
  Administered 2022-04-17: 15 mL via OROMUCOSAL
  Filled 2022-04-16: qty 15

## 2022-04-16 MED ORDER — POTASSIUM CHLORIDE 2 MEQ/ML IV SOLN
80.0000 meq | INTRAVENOUS | Status: DC
  Filled 2022-04-16: qty 40

## 2022-04-16 NOTE — Evaluation (Signed)
Physical Therapy PRE-TAVR TEST Patient Details Name: Bob Ross MRN: [id number] DOB: 09/05/1965 Today's Date: 04/16/2022  History of Present Illness  Pt admitted 04/09/22 for NSTEMI.  Found to have severe aortic stenosis and with TAVR scheduled 04/17/22.  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  Pt is completely independent with gait and mobility. See results of test below:   5 Meter Walk Test:  Trial 1 4.12 seconds  Trial 2 3.90 seconds  Trial 3 4.10 seconds  3 Trial Average/Gait Speed  4.04 seconds and 4.20 ft/sec (<1.8 ft/sec indicates high fall risk)  Comments: Pt performed very easily, no shortness of breath or fatigue, independent gait                 Recommendations for follow up therapy are one component of a multi-disciplinary discharge planning process, led by the attending physician.  Recommendations may be updated based on patient status, additional functional criteria and insurance authorization.  Follow Up Recommendations No PT follow up      Assistance Recommended at Discharge None  Patient can return home with the following       Equipment Recommendations None recommended by PT  Recommendations for Other Services       Functional Status Assessment Patient has not had a recent decline in their functional status     Precautions / Restrictions Precautions Precautions: None      Mobility  Bed  Mobility                    Transfers                   General transfer comment: Pt is independent with all mobility.  PT asked to see pt only for Pre-TAVR 5 M walk test.    Ambulation/Gait                  Stairs            Wheelchair Mobility    Modified Rankin (Stroke Patients Only)       Balance                                             Pertinent Vitals/Pain Pain Assessment Pain Assessment: No/denies pain    Home Living Family/patient expects to be discharged to:: Private residence Living Arrangements: Spouse/significant other Available Help at Discharge: Family;Available PRN/intermittently Type of Home: House Home Access: Stairs to enter Entrance Stairs-Rails: Psychiatric nurse of Steps: 5   Home Layout: One level Home Equipment: Crutches      Prior Function Prior Level of Function : Independent/Modified Independent;Working/employed;Driving                     Hand Dominance        Extremity/Trunk Assessment  Communication   Communication: No difficulties  Cognition Arousal/Alertness: Awake/alert Behavior During Therapy: WFL for tasks assessed/performed Overall Cognitive Status: Within Functional Limits for tasks assessed                                          General Comments      Exercises     Assessment/Plan    PT Assessment Patient does not need any further PT services  PT Problem List         PT Treatment Interventions      PT Goals (Current goals can be found in the Care Plan section)  Acute Rehab PT Goals PT Goal Formulation: All assessment and education complete, DC therapy    Frequency       Co-evaluation               AM-PAC PT "6 Clicks" Mobility  Outcome Measure Help needed turning from your back to your side while in a flat bed without using bedrails?: None Help needed moving from lying on your  back to sitting on the side of a flat bed without using bedrails?: None Help needed moving to and from a bed to a chair (including a wheelchair)?: None Help needed standing up from a chair using your arms (e.g., wheelchair or bedside chair)?: None Help needed to walk in hospital room?: None Help needed climbing 3-5 steps with a railing? : None 6 Click Score: 24    End of Session   Activity Tolerance: Patient tolerated treatment well Patient left: in bed;with call bell/phone within reach Nurse Communication: Mobility status      Time: 7062-3762 PT Time Calculation (min) (ACUTE ONLY): 8 min   Charges:   PT Evaluation $PT Eval Low Complexity: 1 Low          Malyn Aytes, PT Acute Rehab [facility] [phone]   Karlton Lemon 04/16/2022, 2:48 PM

## 2022-04-16 NOTE — TOC Initial Note (Signed)
Transition of Care [facility]) - Initial/Assessment Note    Patient Details  Name: Bob Ross MRN: [id number] Date of Birth: 1966-03-09  Transition of Care [facility]) CM/SW Contact:    Bethena Roys, RN Phone Number: 04/16/2022, 3:02 PM  Clinical Narrative:  Patient presented for chest tightness. PTA patient was independent  from home with his spouse. Patient has PCP and transportation to appointments. Plan will be for TAVR on 04-17-22. Case Manager will continue to follow for transition of care needs as the patient progresses.               Expected Discharge Plan: Home/Self Care Barriers to Discharge: Continued Medical Work up  Expected Discharge Plan and Services Expected Discharge Plan: Home/Self Care In-house Referral: NA Discharge Planning Services: CM Consult   Living arrangements for the past 2 months: Single Family Home                   DME Agency: NA     Prior Living Arrangements/Services Living arrangements for the past 2 months: Single Family Home Lives with:: Spouse Patient language and need for interpreter reviewed:: Yes Do you feel safe going back to the place where you live?: Yes      Need for Family Participation in Patient Care: Yes (Comment) Care giver support system in place?: Yes (comment)   Criminal Activity/Legal Involvement Pertinent to Current Situation/Hospitalization: No - Comment as needed  Activities of Daily Living Home Assistive Devices/Equipment: None ADL Screening (condition at time of admission) Patient's cognitive ability adequate to safely complete daily activities?: Yes Is the patient deaf or have difficulty hearing?: No Does the patient have difficulty seeing, even when wearing glasses/contacts?: No Does the patient have difficulty concentrating, remembering, or making decisions?: No Patient able to express need for assistance with ADLs?: Yes Does the patient have difficulty dressing or bathing?: No Independently performs ADLs?: Yes  (appropriate for developmental age) Does the patient have difficulty walking or climbing stairs?: No Weakness of Legs: None Weakness of Arms/Hands: None  Permission Sought/Granted Permission sought to share information with : Family Supports, Case Manager   Emotional Assessment Appearance:: Appears stated age Attitude/Demeanor/Rapport: Engaged Affect (typically observed): Appropriate   Alcohol / Substance Use: Not Applicable Psych Involvement: No (comment)  Admission diagnosis:  NSTEMI (non-ST elevated myocardial infarction) [facility]) [I21.4] Patient Active Problem List   Diagnosis Date Noted   Severe aortic stenosis 04/15/2022   Caries    Accretions on teeth    Chronic periodontitis    Non-ST elevation (NSTEMI) myocardial infarction ([place]) 04/09/2022   Otitis externa 04/08/2022   Tobacco abuse 04/08/2022   Coronary artery disease 01/19/2020   Rupture of left distal biceps tendon 08/13/2018   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 [place] [address] Phone: [phone] Fax: [phone]  [facility] [address], [place] - [place] [address] [place] [address] [place] [address] Phone: [phone] Fax: [phone]   Readmission Risk Interventions     No data to display

## 2022-04-16 NOTE — Progress Notes (Addendum)
[place] VALVE TEAM  Patient Name: Bob Ross Date of Encounter: 04/16/2022  Admit date: 04/09/2022  Primary Care Provider: Coolidge Breeze, [facility] Cardiologist: Quay Burow, MD  [facility] Electrophysiologist:  None   [facility] Problem List     Principal Problem:   Non-ST elevation (NSTEMI) myocardial infarction [facility]) Active Problems:   Chest pain   Thrombocytopenia (H[facility])   Tobacco abuse   Caries   Accretions on teeth   Chronic periodontitis   Severe aortic stenosis     Subjective   No complaints. Went over [employer]. Anxious to get surgery done.   Inpatient Medications    Scheduled Meds:  aspirin EC  81 mg Oral Daily   enoxaparin (LOVENOX) injection  40 mg Subcutaneous Daily   icosapent Ethyl  2 g Oral BID   metoprolol succinate  12.5 mg Oral Daily   nicotine  21 mg Transdermal Daily   rosuvastatin  20 mg Oral Daily   senna-docusate  2 tablet Oral BID   sodium chloride flush  3 mL Intravenous Q12H   sodium chloride flush  3 mL Intravenous Q12H   Continuous Infusions:  sodium chloride     sodium chloride     PRN Meds: sodium chloride, sodium chloride, acetaminophen, albuterol, nitroGLYCERIN, ondansetron (ZOFRAN) IV, sodium chloride flush, sodium chloride flush, traMADol   Vital Signs    Vitals:   04/14/22 2122 04/15/22 0910 04/15/22 1938 04/16/22 0623  BP: 121/80 126/86 116/82 113/65  Pulse: (!) 56 83 81 70  Resp: '16  19 15  '$ Temp: 98.2 F (36.8 C)  98.4 F (36.9 C) 98.4 F (36.9 C)  TempSrc: Oral  Oral Oral  SpO2: 96%  96% 96%  Weight:      Height:       No intake or output data in the 24 hours ending 04/16/22 0718 Filed Weights   04/09/22 1349 04/10/22 0606  Weight: 100.7 kg 100.1 kg    Physical Exam    GEN: Well nourished, well developed, in no acute distress.  HEENT: Grossly normal.  Neck: Supple, no JVD, carotid bruits, or masses. Cardiac: RRR, 3/6 SEM @ RUSB. No rubs,  or gallops. No clubbing, cyanosis, edema.   Respiratory:  Respirations regular and unlabored, clear to auscultation bilaterally. GI: Soft, nontender, nondistended, BS + x 4. MS: no deformity or atrophy. Skin: warm and dry, no rash. Neuro:  Strength and sensation are intact. Psych: AAOx3.  Normal affect.  Labs    CBC No results for input(s): "WBC", "NEUTROABS", "HGB", "HCT", "MCV", "PLT" in the last 72 hours. Basic Metabolic Panel Recent Labs    04/14/22 0910 04/15/22 0358  NA 140 141  K 4.4 4.7  CL 107 109  CO2 22 23  GLUCOSE 106* 95  BUN 16 19  CREATININE 1.03 1.20  CALCIUM 9.3 9.2   Liver Function Tests No results for input(s): "AST", "ALT", "ALKPHOS", "BILITOT", "PROT", "ALBUMIN" in the last 72 hours. No results for input(s): "LIPASE", "AMYLASE" in the last 72 hours. Cardiac Enzymes No results for input(s): "CKTOTAL", "CKMB", "CKMBINDEX", "TROPONINI" in the last 72 hours. BNP Invalid input(s): "POCBNP" D-Dimer No results for input(s): "DDIMER" in the last 72 hours. Hemoglobin A1C No results for input(s): "HGBA1C" in the last 72 hours. Fasting Lipid Panel No results for input(s): "CHOL", "HDL", "LDLCALC", "TRIG", "CHOLHDL", "LDLDIRECT" in the last 72 hours. Thyroid Function Tests No results for input(s): "TSH", "T4TOTAL", "T3FREE", "THYROIDAB" in the last 72 hours.  Invalid input(s): "FREET3"  Telemetry    sinus with some bradycardia HR 40-50s- Personally Reviewed  ECG    No new tracing - Personally Reviewed  Radiology    No results found.  Cardiac Studies   Left Cardiac Catheterization 04/09/2022: 1.  Mild obstructive coronary artery disease. 2.  Severely elevated LVEDP of 29 mmHg. 3.  Heavily calcified aortic valve with a mean gradient of 52 mmHg and a peak to peak gradient of 62 mmHg consistent with severe aortic stenosis; aortic waveforms demonstrated parvus and tardus. 4.  Preserved left ventricular ejection fraction.   Recommendation:  Echocardiogram to evaluate for severe aortic stenosis which will inform therapy moving forward.   Diagnostic Dominance: Right  _______________   Echocardiogram 04/10/2022: Impressions: 1. Left ventricular ejection fraction, by estimation, is 50 to 55%. Left  ventricular ejection fraction by 2D MOD biplane is 52.6 %. The left  ventricle has low normal function. The left ventricle demonstrates  regional wall motion abnormalities (see  scoring diagram/findings for description). The left ventricular internal  cavity size was mildly dilated. Left ventricular diastolic parameters are  consistent with Grade I diastolic dysfunction (impaired relaxation).  Elevated left ventricular  end-diastolic pressure. There is mild hypokinesis of the left ventricular,  basal-mid inferior wall and inferolateral wall.   2. Right ventricular systolic function is normal. The right ventricular  size is normal.   3. The mitral valve is grossly normal. Trivial mitral valve  regurgitation.   4. The aortic valve is calcified. There is moderate calcification of the  aortic valve. Aortic valve regurgitation is not visualized. Severe aortic  valve stenosis. Aortic valve area, by VTI measures 0.52 cm. Aortic valve  mean gradient measures 48.7  mmHg. Aortic valve Vmax measures 4.29 m/s. DI is 0.16   5. The inferior vena cava is normal in size with greater than 50%  respiratory variability, suggesting right atrial pressure of 3 mmHg.   Comparison(s): No prior Echocardiogram.   _______________   Coronary CTA 04/10/2022: 1. Bicuspid aortic valve with fusion of right and noncoronary cusps. Severe calcifications (AV calcium score 4864) 2. Aortic annulus measures 62m x 291mwith perimeter 8680mnd area 579 mm^2. Moderate annular calcifications adjacent to right coronary cusp and extending into LVOT. Annular measurements are suitable for delivery of 62m71mwards Sapien 3 valve 3. Sufficient coronary to annulus  distance, measuring 14mm109mleft main and 14mm 36mCA 4. Optimum Fluoroscopic Angle for Delivery:  RAO 3 CRA 9 5. Coronary calcium score 757 (98th percentile)   _____________________________  STS SCORE Procedure Type: Isolated AVR Perioperative Outcome Estimate % Operative Mortality 2% Morbidity & Mortality 10.6% Stroke 0.668% Renal Failure 2.05% Reoperation 3.96% Prolonged Ventilation 6.05% Deep Sternal Wound Infection 0.118% [facility] 4.18% Short [facility](<[facility])* 40.1%  _____________________________  [facility]     04/16/2022    7:21 AM  KCCQ-12  1 a. Ability to shower/bathe Not at all limited  1 b. Ability to walk 1 block Not at all limited  1 c. Ability to hurry/jog Not at all limited  2. Edema feet/ankles/legs Never over the past 2 weeks  3. Limited by fatigue Never over the past 2 weeks  4. Limited by dyspnea Never over the past 2 weeks  5. Sitting up / on 3+ pillows Never over the past 2 weeks  6. Limited enjoyment of life Not limited at all  7. Rest of life w/ symptoms Mostly satisfied  8 a. Participation  in hobbies Did not limit at all  8 b. Participation in chores Did not limit at all  8 c. Visiting family/friends Did not limit at all      Patient Profile     Bob Ross is a 56 y.o. 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   Assessment & Plan     NTSTEMI: presented with chest pain. High-sensitivity troponin 489 >> 1,146. LHC on 8/16 showed mild non-obstructive CAD but severe aortic stenosis and severely elevated LVEDP. Echo showed LVEF of 50-55% with hypokinesis of the anterolateral wall and posterior wall.  -- No recurrent chest pain. -- Continue Aspirin '81mg'$  daily and Crestor '20mg'$  daily.  Severe aortic stenosis: echo this admission showed EF 50-55% and severe AS with a mean grad 48.7 mmHg, peak grad 73.7 mmHg, AVA 0.55 cm2, DVI 0.16, SVI 28.  -- He has NYHA class I symptoms and was previously working with no limitations prior to his admission for chest pain/NSTEMI. -- He has been evaluated by our multidisciplinary valve team and options of TAVR vs SAVR via mini thoracotomy. Given numerous abdominal surgeries with mesh and prolonged healing, severe COPD by PFTs and patient/family preference, we think he would be best suited by TAVR.  -- TAVR scheduled for tomorrow afternoon 04/17/22 @ 1pm. Inpatient orders placed.   Poor dentition: seen by Dr. Benson Norway and needs a near full mouth extraction. This will be deferred after TAVR given unstable presentation with NSTEMI.   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 Echo this admission shows LVEF of 50-55% and grade 1 diastolic dysfunction. LVEDP was 29 on cardiac catheterization and he received one dose of IV Lasix following this with good response. He is now felt to be evuolemic.  -- Continue home Toprol XL 12.'5mg'$  daily. Home lisinopril on hold to avoid hypotension in the setting of severe AS.   HTN: BP on soft side. Hold Toprol-XL 12.'5mg'$  daily  HLD: Lipid panel this admission: Total Cholesterol 126, Triglycerides 309, HDL 36, LDL 28. -- On Lipitor '40mg'$  daily at home but switched to Crestor '20mg'$  daily here. Also started him on Vascepa 2g twice daily. -- Will need repeat lipid panel and LFTs in 6-8 weeks.  Pre-Diabetes: hemoglobin A1c 6.2 this admission.    Tobacco  Abuse: continue nicotine patches.    Mable Fill, PA-C  04/16/2022, 7:18 AM  Pager [phone]  Patient seen, examined. Available data reviewed. Agree with findings, assessment, and plan as outlined by Nell Range, PA-C.  The patient is independently interviewed and examined this morning.  He is alert, oriented, in no distress.  HEENT is normal, lungs are clear bilaterally, heart is regular rate and rhythm with a 3/6 harsh crescendo decrescendo murmur at the right upper sternal border, abdomen is soft and nontender, large ventral hernia unchanged, extremities without edema.  All available data is reviewed.  See prior discussions regarding TAVR versus surgical AVR.  Considering his significant comorbid conditions including huge abdominal ventral hernia,  chronic lung disease and continued smoking, and single kidney, we feel he is best treated with TAVR which will be a lower risk surgery for him.  Through a shared decision-making conversation, the patient is in agreement and favors a minimally invasive approach as well.  Risks, indications, and alternatives of TAVR have been reviewed with the patient.  He agrees to proceed as scheduled tomorrow.  Sherren Mocha, M.D. 04/16/2022 7:05 PM

## 2022-04-16 NOTE — H&P (View-Only) (Signed)
[facility]VE TEAM  Patient Name: Bob Ross Date of Encounter: 04/16/2022  Admit date: 04/09/2022  Primary Care Provider: [facility] Cardiologist: Quay Burow, MD  [facility] Electrophysiologist:  None   [facility] Problem List     Principal Problem:   Non-ST elevation (NSTEMI) myocardial infarction [facility]) Active Problems:   Chest pain   Thrombocytopenia (HCC)   Tobacco abuse   Caries   Accretions on teeth   Chronic periodontitis   Severe aortic stenosis     Subjective   No complaints. Went over [employer]. Anxious to get surgery done.   Inpatient Medications    Scheduled Meds:  aspirin EC  81 mg Oral Daily   enoxaparin (LOVENOX) injection  40 mg Subcutaneous Daily   icosapent Ethyl  2 g Oral BID   metoprolol succinate  12.5 mg Oral Daily   nicotine  21 mg Transdermal Daily   rosuvastatin  20 mg Oral Daily   senna-docusate  2 tablet Oral BID   sodium chloride flush  3 mL Intravenous Q12H   sodium chloride flush  3 mL Intravenous Q12H   Continuous Infusions:  sodium chloride     sodium chloride     PRN Meds: sodium chloride, sodium chloride, acetaminophen, albuterol, nitroGLYCERIN, ondansetron (ZOFRAN) IV, sodium chloride flush, sodium chloride flush, traMADol   Vital Signs    Vitals:   04/14/22 2122 04/15/22 0910 04/15/22 1938 04/16/22 0623  BP: 121/80 126/86 116/82 113/65  Pulse: (!) 56 83 81 70  Resp: '16  19 15  '$ Temp: 98.2 F (36.8 C)  98.4 F (36.9 C) 98.4 F (36.9 C)  TempSrc: Oral  Oral Oral  SpO2: 96%  96% 96%  Weight:      Height:       No intake or output data in the 24 hours ending 04/16/22 0718 Filed Weights   04/09/22 1349 04/10/22 0606  Weight: 100.7 kg 100.1 kg    Physical Exam    GEN: Well nourished, well developed, in no acute distress.  HEENT: Grossly normal.  Neck: Supple, no JVD, carotid bruits, or masses. Cardiac: RRR, 3/6 SEM @ RUSB. No rubs,  or gallops. No clubbing, cyanosis, edema.   Respiratory:  Respirations regular and unlabored, clear to auscultation bilaterally. GI: Soft, nontender, nondistended, BS + x 4. MS: no deformity or atrophy. Skin: warm and dry, no rash. Neuro:  Strength and sensation are intact. Psych: AAOx3.  Normal affect.  Labs    CBC No results for input(s): "WBC", "NEUTROABS", "HGB", "HCT", "MCV", "PLT" in the last 72 hours. Basic Metabolic Panel Recent Labs    04/14/22 0910 04/15/22 0358  NA 140 141  K 4.4 4.7  CL 107 109  CO2 22 23  GLUCOSE 106* 95  BUN 16 19  CREATININE 1.03 1.20  CALCIUM 9.3 9.2   Liver Function Tests No results for input(s): "AST", "ALT", "ALKPHOS", "BILITOT", "PROT", "ALBUMIN" in the last 72 hours. No results for input(s): "LIPASE", "AMYLASE" in the last 72 hours. Cardiac Enzymes No results for input(s): "CKTOTAL", "CKMB", "CKMBINDEX", "TROPONINI" in the last 72 hours. BNP Invalid input(s): "POCBNP" D-Dimer No results for input(s): "DDIMER" in the last 72 hours. Hemoglobin A1C No results for input(s): "HGBA1C" in the last 72 hours. Fasting Lipid Panel No results for input(s): "CHOL", "HDL", "LDLCALC", "TRIG", "CHOLHDL", "LDLDIRECT" in the last 72 hours. Thyroid Function Tests No results for input(s): "TSH", "T4TOTAL", "T3FREE", "THYROIDAB" in the last 72 hours.  Invalid input(s): "FREET3"  Telemetry    sinus with some bradycardia HR 40-50s- Personally Reviewed  ECG    No new tracing - Personally Reviewed  Radiology    No results found.  Cardiac Studies   Left Cardiac Catheterization 04/09/2022: 1.  Mild obstructive coronary artery disease. 2.  Severely elevated LVEDP of 29 mmHg. 3.  Heavily calcified aortic valve with a mean gradient of 52 mmHg and a peak to peak gradient of 62 mmHg consistent with severe aortic stenosis; aortic waveforms demonstrated parvus and tardus. 4.  Preserved left ventricular ejection fraction.   Recommendation:  Echocardiogram to evaluate for severe aortic stenosis which will inform therapy moving forward.   Diagnostic Dominance: Right  _______________   Echocardiogram 04/10/2022: Impressions: 1. Left ventricular ejection fraction, by estimation, is 50 to 55%. Left  ventricular ejection fraction by 2D MOD biplane is 52.6 %. The left  ventricle has low normal function. The left ventricle demonstrates  regional wall motion abnormalities (see  scoring diagram/findings for description). The left ventricular internal  cavity size was mildly dilated. Left ventricular diastolic parameters are  consistent with Grade I diastolic dysfunction (impaired relaxation).  Elevated left ventricular  end-diastolic pressure. There is mild hypokinesis of the left ventricular,  basal-mid inferior wall and inferolateral wall.   2. Right ventricular systolic function is normal. The right ventricular  size is normal.   3. The mitral valve is grossly normal. Trivial mitral valve  regurgitation.   4. The aortic valve is calcified. There is moderate calcification of the  aortic valve. Aortic valve regurgitation is not visualized. Severe aortic  valve stenosis. Aortic valve area, by VTI measures 0.52 cm. Aortic valve  mean gradient measures 48.7  mmHg. Aortic valve Vmax measures 4.29 m/s. DI is 0.16   5. The inferior vena cava is normal in size with greater than 50%  respiratory variability, suggesting right atrial pressure of 3 mmHg.   Comparison(s): No prior Echocardiogram.   _______________   Coronary CTA 04/10/2022: 1. Bicuspid aortic valve with fusion of right and noncoronary cusps. Severe calcifications (AV calcium score 4864) 2. Aortic annulus measures 51m x 282mwith perimeter 8673mnd area 579 mm^2. Moderate annular calcifications adjacent to right coronary cusp and extending into LVOT. Annular measurements are suitable for delivery of 45m67mwards Sapien 3 valve 3. Sufficient coronary to annulus  distance, measuring 14mm22mleft main and 14mm 61mCA 4. Optimum Fluoroscopic Angle for Delivery:  RAO 3 CRA 9 5. Coronary calcium score 757 (98th percentile)   _____________________________  STS SCORE Procedure Type: Isolated AVR Perioperative Outcome Estimate % Operative Mortality 2% Morbidity & Mortality 10.6% Stroke 0.668% Renal Failure 2.05% Reoperation 3.96% Prolonged Ventilation 6.05% Deep Sternal Wound Infection 0.118% [facility] 4.18% Short [facility](<[facility])* 40.1%  _____________________________  [facility]     04/16/2022    7:21 AM  KCCQ-12  1 a. Ability to shower/bathe Not at all limited  1 b. Ability to walk 1 block Not at all limited  1 c. Ability to hurry/jog Not at all limited  2. Edema feet/ankles/legs Never over the past 2 weeks  3. Limited by fatigue Never over the past 2 weeks  4. Limited by dyspnea Never over the past 2 weeks  5. Sitting up / on 3+ pillows Never over the past 2 weeks  6. Limited enjoyment of life Not limited at all  7. Rest of life w/ symptoms Mostly satisfied  8 a. Participation  in hobbies Did not limit at all  8 b. Participation in chores Did not limit at all  8 c. Visiting family/friends Did not limit at all      Patient Profile     Rawn Quiroa is a 56 y.o. 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   Assessment & Plan     NTSTEMI: presented with chest pain. High-sensitivity troponin 489 >> 1,146. LHC on 8/16 showed mild non-obstructive CAD but severe aortic stenosis and severely elevated LVEDP. Echo showed LVEF of 50-55% with hypokinesis of the anterolateral wall and posterior wall.  -- No recurrent chest pain. -- Continue Aspirin '81mg'$  daily and Crestor '20mg'$  daily.  Severe aortic stenosis: echo this admission showed EF 50-55% and severe AS with a mean grad 48.7 mmHg, peak grad 73.7 mmHg, AVA 0.55 cm2, DVI 0.16, SVI 28.  -- He has NYHA class I symptoms and was previously working with no limitations prior to his admission for chest pain/NSTEMI. -- He has been evaluated by our multidisciplinary valve team and options of TAVR vs SAVR via mini thoracotomy. Given numerous abdominal surgeries with mesh and prolonged healing, severe COPD by PFTs and patient/family preference, we think he would be best suited by TAVR.  -- TAVR scheduled for tomorrow afternoon 04/17/22 @ 1pm. Inpatient orders placed.   Poor dentition: seen by Dr. Benson Norway and needs a near full mouth extraction. This will be deferred after TAVR given unstable presentation with NSTEMI.   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 Echo this admission shows LVEF of 50-55% and grade 1 diastolic dysfunction. LVEDP was 29 on cardiac catheterization and he received one dose of IV Lasix following this with good response. He is now felt to be evuolemic.  -- Continue home Toprol XL 12.'5mg'$  daily. Home lisinopril on hold to avoid hypotension in the setting of severe AS.   HTN: BP on soft side. Hold Toprol-XL 12.'5mg'$  daily  HLD: Lipid panel this admission: Total Cholesterol 126, Triglycerides 309, HDL 36, LDL 28. -- On Lipitor '40mg'$  daily at home but switched to Crestor '20mg'$  daily here. Also started him on Vascepa 2g twice daily. -- Will need repeat lipid panel and LFTs in 6-8 weeks.  Pre-Diabetes: hemoglobin A1c 6.2 this admission.    Tobacco  Abuse: continue nicotine patches.    Mable Fill, PA-C  04/16/2022, 7:18 AM  Pager [phone]  Patient seen, examined. Available data reviewed. Agree with findings, assessment, and plan as outlined by Nell Range, PA-C.  The patient is independently interviewed and examined this morning.  He is alert, oriented, in no distress.  HEENT is normal, lungs are clear bilaterally, heart is regular rate and rhythm with a 3/6 harsh crescendo decrescendo murmur at the right upper sternal border, abdomen is soft and nontender, large ventral hernia unchanged, extremities without edema.  All available data is reviewed.  See prior discussions regarding TAVR versus surgical AVR.  Considering his significant comorbid conditions including huge abdominal ventral hernia,  chronic lung disease and continued smoking, and single kidney, we feel he is best treated with TAVR which will be a lower risk surgery for him.  Through a shared decision-making conversation, the patient is in agreement and favors a minimally invasive approach as well.  Risks, indications, and alternatives of TAVR have been reviewed with the patient.  He agrees to proceed as scheduled tomorrow.  Sherren Mocha, M.D. 04/16/2022 7:05 PM

## 2022-04-17 ENCOUNTER — Encounter (HOSPITAL_COMMUNITY)
Admission: EM | Disposition: A | Discharge status: Home / Self Care | Payer: Self-pay | Source: Home / Self Care | Attending: Cardiovascular Disease

## 2022-04-17 ENCOUNTER — Inpatient Hospital Stay (HOSPITAL_COMMUNITY): Payer: BC Managed Care – PPO | Admitting: Certified Registered Nurse Anesthetist

## 2022-04-17 ENCOUNTER — Encounter (HOSPITAL_COMMUNITY): Payer: Self-pay | Admitting: Cardiology

## 2022-04-17 ENCOUNTER — Inpatient Hospital Stay (HOSPITAL_COMMUNITY): Payer: BC Managed Care – PPO

## 2022-04-17 ENCOUNTER — Other Ambulatory Visit: Payer: Self-pay

## 2022-04-17 DIAGNOSIS — Z20822 Contact with and (suspected) exposure to covid-19: Secondary | ICD-10-CM | POA: Diagnosis not present

## 2022-04-17 DIAGNOSIS — I214 Non-ST elevation (NSTEMI) myocardial infarction: Secondary | ICD-10-CM | POA: Diagnosis not present

## 2022-04-17 DIAGNOSIS — I350 Nonrheumatic aortic (valve) stenosis: Secondary | ICD-10-CM

## 2022-04-17 DIAGNOSIS — Z006 Encounter for examination for normal comparison and control in clinical research program: Secondary | ICD-10-CM

## 2022-04-17 DIAGNOSIS — Z952 Presence of prosthetic heart valve: Secondary | ICD-10-CM

## 2022-04-17 HISTORY — PX: INTRAOPERATIVE TRANSTHORACIC ECHOCARDIOGRAM: SHX6523

## 2022-04-17 HISTORY — PX: TRANSCATHETER AORTIC VALVE REPLACEMENT, TRANSFEMORAL: SHX6400

## 2022-04-17 HISTORY — DX: Presence of prosthetic heart valve: Z95.2

## 2022-04-17 LAB — SARS CORONAVIRUS 2 BY RT PCR: SARS Coronavirus 2 by RT PCR: NEGATIVE

## 2022-04-17 LAB — ECHOCARDIOGRAM LIMITED
AR max vel: 2.22 cm2
AV Area VTI: 2.63 cm2
AV Area mean vel: 2.1 cm2
AV Mean grad: 6 mmHg
AV Peak grad: 12 mmHg
Ao pk vel: 1.73 m/s
Height: 71 in
Weight: 3529.6 oz

## 2022-04-17 LAB — TYPE AND SCREEN
ABO/RH(D): O POS
Antibody Screen: NEGATIVE

## 2022-04-17 LAB — BASIC METABOLIC PANEL
Anion gap: 8 (ref 5–15)
BUN: 18 mg/dL (ref 6–20)
CO2: 22 mmol/L (ref 22–32)
Calcium: 9 mg/dL (ref 8.9–10.3)
Chloride: 109 mmol/L (ref 98–111)
Creatinine, Ser: 1.16 mg/dL (ref 0.61–1.24)
GFR, Estimated: >60 mL/min (ref >60)
Glucose, Bld: 102 mg/dL — ABNORMAL HIGH (ref 70–99)
Potassium: 4.4 mmol/L (ref 3.5–5.1)
Sodium: 139 mmol/L (ref 135–145)

## 2022-04-17 LAB — CBC
HCT: 46.1 % (ref 39.0–52.0)
Hemoglobin: 15.9 g/dL (ref 13.0–17.0)
MCH: 31.6 pg (ref 26.0–34.0)
MCHC: 34.5 g/dL (ref 30.0–36.0)
MCV: 91.7 fL (ref 80.0–100.0)
Platelets: 91 10*3/uL — ABNORMAL LOW (ref 150–400)
RBC: 5.03 MIL/uL (ref 4.22–5.81)
RDW: 14.2 % (ref 11.5–15.5)
WBC: 7.7 10*3/uL (ref 4.0–10.5)
nRBC: 0 % (ref 0.0–0.2)

## 2022-04-17 LAB — POCT I-STAT, CHEM 8
BUN: 20 mg/dL (ref 6–20)
Calcium, Ion: 1.28 mmol/L (ref 1.15–1.40)
Chloride: 107 mmol/L (ref 98–111)
Creatinine, Ser: 1.1 mg/dL (ref 0.61–1.24)
Glucose, Bld: 95 mg/dL (ref 70–99)
HCT: 46 % (ref 39.0–52.0)
Hemoglobin: 15.6 g/dL (ref 13.0–17.0)
Potassium: 4.6 mmol/L (ref 3.5–5.1)
Sodium: 141 mmol/L (ref 135–145)
TCO2: 23 mmol/L (ref 22–32)

## 2022-04-17 LAB — SURGICAL PCR SCREEN
MRSA, PCR: NEGATIVE
Staphylococcus aureus: NEGATIVE

## 2022-04-17 LAB — PROTIME-INR
INR: 0.9 (ref 0.8–1.2)
Prothrombin Time: 12.3 seconds (ref 11.4–15.2)

## 2022-04-17 LAB — ABO/RH: ABO/RH(D): O POS

## 2022-04-17 SURGERY — IMPLANTATION, AORTIC VALVE, TRANSCATHETER, FEMORAL APPROACH
Anesthesia: Monitor Anesthesia Care | Site: Chest

## 2022-04-17 MED ORDER — PHENYLEPHRINE 80 MCG/ML (10ML) SYRINGE FOR IV PUSH (FOR BLOOD PRESSURE SUPPORT)
PREFILLED_SYRINGE | INTRAVENOUS | Status: DC | PRN
  Administered 2022-04-17: 80 ug via INTRAVENOUS

## 2022-04-17 MED ORDER — SODIUM CHLORIDE 0.9% FLUSH
3.0000 mL | INTRAVENOUS | Status: DC | PRN
  Administered 2022-04-17: 3 mL via INTRAVENOUS

## 2022-04-17 MED ORDER — SODIUM CHLORIDE 0.9% FLUSH: 3.0000 mL | Freq: Two times a day (BID) | INTRAVENOUS | Status: DC

## 2022-04-17 MED ORDER — PHENYLEPHRINE 80 MCG/ML (10ML) SYRINGE FOR IV PUSH (FOR BLOOD PRESSURE SUPPORT)
PREFILLED_SYRINGE | INTRAVENOUS | Status: AC
  Filled 2022-04-17: qty 10

## 2022-04-17 MED ORDER — PROTAMINE SULFATE 10 MG/ML IV SOLN
INTRAVENOUS | Status: DC | PRN
  Administered 2022-04-17: 180 mg via INTRAVENOUS

## 2022-04-17 MED ORDER — FENTANYL CITRATE (PF) 250 MCG/5ML IJ SOLN
INTRAMUSCULAR | Status: DC | PRN
  Administered 2022-04-17: 50 ug via INTRAVENOUS

## 2022-04-17 MED ORDER — HEPARIN 6000 UNIT IRRIGATION SOLUTION
Status: DC | PRN
  Administered 2022-04-17: 1
  Administered 2022-04-17: 2

## 2022-04-17 MED ORDER — SODIUM CHLORIDE 0.9 % IV SOLN: INTRAVENOUS | Status: AC

## 2022-04-17 MED ORDER — ONDANSETRON HCL 4 MG/2ML IJ SOLN: 4.0000 mg | Freq: Four times a day (QID) | INTRAMUSCULAR | Status: DC | PRN

## 2022-04-17 MED ORDER — NITROGLYCERIN IN D5W 200-5 MCG/ML-% IV SOLN: 0.0000 ug/min | INTRAVENOUS | Status: DC

## 2022-04-17 MED ORDER — ACETAMINOPHEN 325 MG PO TABS: 650.0000 mg | ORAL_TABLET | Freq: Four times a day (QID) | ORAL | Status: DC | PRN

## 2022-04-17 MED ORDER — OXYCODONE HCL 5 MG PO TABS: 5.0000 mg | ORAL_TABLET | ORAL | Status: DC | PRN

## 2022-04-17 MED ORDER — HEPARIN SODIUM (PORCINE) 1000 UNIT/ML IJ SOLN
INTRAMUSCULAR | Status: AC
  Filled 2022-04-17: qty 20

## 2022-04-17 MED ORDER — MIDAZOLAM HCL 2 MG/2ML IJ SOLN
INTRAMUSCULAR | Status: AC
  Filled 2022-04-17: qty 2

## 2022-04-17 MED ORDER — MIDAZOLAM HCL 2 MG/2ML IJ SOLN
INTRAMUSCULAR | Status: DC | PRN
  Administered 2022-04-17: 2 mg via INTRAVENOUS

## 2022-04-17 MED ORDER — ONDANSETRON HCL 4 MG/2ML IJ SOLN
INTRAMUSCULAR | Status: AC
  Filled 2022-04-17: qty 2

## 2022-04-17 MED ORDER — SODIUM CHLORIDE 0.9 % IV SOLN: 250.0000 mL | INTRAVENOUS | Status: DC | PRN

## 2022-04-17 MED ORDER — CEFAZOLIN SODIUM-DEXTROSE 2-4 GM/100ML-% IV SOLN
2.0000 g | Freq: Three times a day (TID) | INTRAVENOUS | Status: AC
  Administered 2022-04-17 – 2022-04-18 (×2): 2 g via INTRAVENOUS
  Filled 2022-04-17 (×2): qty 100

## 2022-04-17 MED ORDER — PROPOFOL 10 MG/ML IV BOLUS
INTRAVENOUS | Status: DC | PRN
  Administered 2022-04-17: 20 mg via INTRAVENOUS

## 2022-04-17 MED ORDER — HEPARIN 6000 UNIT IRRIGATION SOLUTION
Status: AC
Start: 2022-04-17 — End: ?
  Filled 2022-04-17: qty 1500

## 2022-04-17 MED ORDER — LACTATED RINGERS IV SOLN: INTRAVENOUS | Status: DC | PRN

## 2022-04-17 MED ORDER — 0.9 % SODIUM CHLORIDE (POUR BTL) OPTIME
TOPICAL | Status: DC | PRN
  Administered 2022-04-17: 1000 mL

## 2022-04-17 MED ORDER — ONDANSETRON HCL 4 MG/2ML IJ SOLN
INTRAMUSCULAR | Status: DC | PRN
  Administered 2022-04-17: 4 mg via INTRAVENOUS

## 2022-04-17 MED ORDER — PROTAMINE SULFATE 10 MG/ML IV SOLN
INTRAVENOUS | Status: AC
  Filled 2022-04-17: qty 25

## 2022-04-17 MED ORDER — LIDOCAINE HCL 1 % IJ SOLN
INTRAMUSCULAR | Status: AC
  Filled 2022-04-17: qty 20

## 2022-04-17 MED ORDER — ACETAMINOPHEN 650 MG RE SUPP: 650.0000 mg | Freq: Four times a day (QID) | RECTAL | Status: DC | PRN

## 2022-04-17 MED ORDER — HEPARIN SODIUM (PORCINE) 1000 UNIT/ML IJ SOLN
INTRAMUSCULAR | Status: DC | PRN
  Administered 2022-04-17: 18000 [IU] via INTRAVENOUS

## 2022-04-17 MED ORDER — MORPHINE SULFATE (PF) 2 MG/ML IV SOLN: 1.0000 mg | INTRAVENOUS | Status: DC | PRN

## 2022-04-17 MED ORDER — FENTANYL CITRATE (PF) 250 MCG/5ML IJ SOLN
INTRAMUSCULAR | Status: AC
  Filled 2022-04-17: qty 5

## 2022-04-17 MED ORDER — PROPOFOL 500 MG/50ML IV EMUL
INTRAVENOUS | Status: DC | PRN
  Administered 2022-04-17: 20 ug/kg/min via INTRAVENOUS

## 2022-04-17 MED ORDER — LIDOCAINE HCL 1 % IJ SOLN
INTRAMUSCULAR | Status: DC | PRN
  Administered 2022-04-17: 20 mL

## 2022-04-17 MED ORDER — IODIXANOL 320 MG/ML IV SOLN
INTRAVENOUS | Status: DC | PRN
  Administered 2022-04-17: 100 mL

## 2022-04-17 SURGICAL SUPPLY — 67 items
BAG COUNTER SPONGE SURGICOUNT (BAG) ×2 IMPLANT
BAG DECANTER FOR FLEXI CONT (MISCELLANEOUS) IMPLANT
BALLN TRUE 20X4.5 (BALLOONS) ×2
BALLOON TRUE 20X4.5 (BALLOONS) IMPLANT
BLADE CLIPPER SURG (BLADE) IMPLANT
BLADE STERNUM SYSTEM 6 (BLADE) IMPLANT
CABLE ADAPT CONN TEMP 6FT (ADAPTER) ×2 IMPLANT
CANISTER SUCT 3000ML PPV (MISCELLANEOUS) IMPLANT
CATH DIAG EXPO 6F AL1 (CATHETERS) IMPLANT
CATH DIAG EXPO 6F VENT PIG 145 (CATHETERS) ×4 IMPLANT
CATH INFINITI 6F AL2 (CATHETERS) IMPLANT
CATH S G BIP PACING (CATHETERS) ×2 IMPLANT
CHLORAPREP W/TINT 26 (MISCELLANEOUS) ×2 IMPLANT
CNTNR URN SCR LID CUP LEK RST (MISCELLANEOUS) ×4 IMPLANT
CONT SPEC 4OZ STRL OR WHT (MISCELLANEOUS) ×4
COVER BACK TABLE 80X110 HD (DRAPES) IMPLANT
DERMABOND IMPLANT
DERMABOND ADVANCED (GAUZE/BANDAGES/DRESSINGS) ×2
DERMABOND ADVANCED .7 DNX12 (GAUZE/BANDAGES/DRESSINGS) ×2 IMPLANT
DEVICE CLOSURE MYNXGRIP 6/7F (Vascular Products) IMPLANT
DEVICE CLOSURE PERCLS PRGLD 6F (VASCULAR PRODUCTS) ×4 IMPLANT
DRSG TEGADERM 2-3/8X2-3/4 SM (GAUZE/BANDAGES/DRESSINGS) IMPLANT
DRSG TEGADERM 4X4.75 (GAUZE/BANDAGES/DRESSINGS) ×4 IMPLANT
ELECT REM PT RETURN 9FT ADLT (ELECTROSURGICAL) ×2
ELECTRODE REM PT RTRN 9FT ADLT (ELECTROSURGICAL) ×2 IMPLANT
GAUZE SPONGE 4X4 12PLY STRL (GAUZE/BANDAGES/DRESSINGS) ×2 IMPLANT
GLOVE BIO SURGEON STRL SZ8 (GLOVE) ×2 IMPLANT
GLOVE ECLIPSE 7.0 STRL STRAW (GLOVE) ×2 IMPLANT
GOWN STRL REUS W/ TWL LRG LVL3 (GOWN DISPOSABLE) IMPLANT
GOWN STRL REUS W/ TWL XL LVL3 (GOWN DISPOSABLE) ×2 IMPLANT
GOWN STRL REUS W/TWL LRG LVL3 (GOWN DISPOSABLE)
GOWN STRL REUS W/TWL XL LVL3 (GOWN DISPOSABLE) ×2
GUIDEWIRE SAF TJ AMPL .035X180 (WIRE) ×2 IMPLANT
GUIDEWIRE SAFE TJ AMPLATZ EXST (WIRE) ×2 IMPLANT
KIT BASIN OR (CUSTOM PROCEDURE TRAY) ×2 IMPLANT
KIT HEART LEFT (KITS) ×2 IMPLANT
KIT SAPIAN 3 ULTRA RESILIA 26 (Valve) IMPLANT
KIT TURNOVER KIT B (KITS) ×2 IMPLANT
NDL PERC 18GX7CM (NEEDLE) IMPLANT
NEEDLE 22X1 1/2 (OR ONLY) (NEEDLE) IMPLANT
NEEDLE PERC 18GX7CM (NEEDLE) ×2 IMPLANT
NS IRRIG 1000ML POUR BTL (IV SOLUTION) ×2 IMPLANT
PACK ENDO MINOR (CUSTOM PROCEDURE TRAY) ×2 IMPLANT
PAD ARMBOARD 7.5X6 YLW CONV (MISCELLANEOUS) ×4 IMPLANT
PAD ELECT DEFIB RADIOL ZOLL (MISCELLANEOUS) ×2 IMPLANT
PERCLOSE PROGLIDE 6F (VASCULAR PRODUCTS) ×4
POSITIONER HEAD DONUT 9IN (MISCELLANEOUS) ×2 IMPLANT
SET MICROPUNCTURE 5F STIFF (MISCELLANEOUS) ×2 IMPLANT
SHEATH BRITE TIP 7FR 35CM (SHEATH) ×2 IMPLANT
SHEATH PINNACLE 6F 10CM (SHEATH) ×2 IMPLANT
SHEATH PINNACLE 8F 10CM (SHEATH) ×2 IMPLANT
SLEEVE REPOSITIONING LENGTH 30 (MISCELLANEOUS) ×2 IMPLANT
SPIKE FLUID TRANSFER (MISCELLANEOUS) ×2 IMPLANT
SPONGE T-LAP 18X18 ~~LOC~~+RFID (SPONGE) ×2 IMPLANT
STOPCOCK MORSE 400PSI 3WAY (MISCELLANEOUS) ×4 IMPLANT
SUT SILK  1 MH (SUTURE) ×2
SUT SILK 1 MH (SUTURE) ×2 IMPLANT
SYR 50ML LL SCALE MARK (SYRINGE) ×2 IMPLANT
SYR BULB IRRIG 60ML STRL (SYRINGE) IMPLANT
TOWEL GREEN STERILE (TOWEL DISPOSABLE) ×4 IMPLANT
TRANSDUCER W/STOPCOCK (MISCELLANEOUS) ×4 IMPLANT
TRAY FOLEY SLVR 14FR TEMP STAT (SET/KITS/TRAYS/PACK) IMPLANT
TUBE SUCT INTRACARD DLP 20F (MISCELLANEOUS) IMPLANT
WIRE EMERALD 3MM-J .035X150CM (WIRE) ×2 IMPLANT
WIRE EMERALD 3MM-J .035X260CM (WIRE) ×2 IMPLANT
WIRE EMERALD ST .035X260CM (WIRE) ×4 IMPLANT
WIRE SAFARI SM CURVE 275 (WIRE) ×2 IMPLANT

## 2022-04-17 NOTE — Transfer of Care (Signed)
Immediate Anesthesia Transfer of Care Note  Patient: Bob Ross  Procedure(s) Performed: Transcatheter Aortic Valve Replacement, Transfemoral (Chest) INTRAOPERATIVE TRANSTHORACIC ECHOCARDIOGRAM  Patient Location: Cath Lab  Anesthesia Type:MAC  Level of Consciousness: drowsy  Airway & Oxygen Therapy: Patient Spontanous Breathing and Patient connected to nasal cannula oxygen  Post-op Assessment: Report given to RN and Post -op Vital signs reviewed and stable  Post vital signs: Reviewed and stable  Last Vitals:  Vitals Value Taken Time  BP 104/76   Temp    Pulse 48 04/17/22 1637  Resp 16 04/17/22 1637  SpO2 96 % 04/17/22 1637  Vitals shown include unvalidated device data.  Last Pain:  Vitals:   04/17/22 1245  TempSrc: Oral  PainSc:       Patients Stated Pain Goal: 0 (40/39/79 5369)  Complications: No notable events documented.

## 2022-04-17 NOTE — Interval H&P Note (Signed)
History and Physical Interval Note:  04/17/2022 11:40 AM  Bob Ross  has presented today for surgery, with the diagnosis of Severe Aortic Stenosis.  The various methods of treatment have been discussed with the patient and family. After consideration of risks, benefits and other options for treatment, the patient has consented to  Procedure(s): Transcatheter Aortic Valve Replacement, Transfemoral (N/A) INTRAOPERATIVE TRANSTHORACIC ECHOCARDIOGRAM (N/A) as a surgical intervention.  The patient's history has been reviewed, patient examined, no change in status, stable for surgery.  I have reviewed the patient's chart and labs.  Questions were answered to the patient's satisfaction.     Sherren Mocha

## 2022-04-17 NOTE — Discharge Instructions (Signed)

## 2022-04-17 NOTE — Interval H&P Note (Signed)
History and Physical Interval Note:  04/17/2022 1:18 PM  Bob Ross  has presented today for surgery, with the diagnosis of Severe Aortic Stenosis.  The various methods of treatment have been discussed with the patient and family. After consideration of risks, benefits and other options for treatment, the patient has consented to  Procedure(s): Transcatheter Aortic Valve Replacement, Transfemoral (N/A) INTRAOPERATIVE TRANSTHORACIC ECHOCARDIOGRAM (N/A) as a surgical intervention.  The patient's history has been reviewed, patient examined, no change in status, stable for surgery.  I have reviewed the patient's chart and labs.  Questions were answered to the patient's satisfaction.     Gaye Pollack

## 2022-04-17 NOTE — Anesthesia Procedure Notes (Signed)
Arterial Line Insertion Start/End8/24/2023 1:25 PM Performed by: Carolan Clines, CRNA  Patient location: Pre-op. Preanesthetic checklist: patient identified, IV checked, site marked, risks and benefits discussed, surgical consent, monitors and equipment checked, pre-op evaluation, timeout performed and anesthesia consent Lidocaine 1% used for infiltration Left, radial was placed Catheter size: 20 G Hand hygiene performed  and maximum sterile barriers used   Attempts: 2 Procedure performed without using ultrasound guided technique. Following insertion, dressing applied and Biopatch. Post procedure assessment: normal and unchanged  Patient tolerated the procedure well with no immediate complications.

## 2022-04-17 NOTE — Op Note (Signed)
[facility]   MULTIDISCIPLINARY HEART VALVE TEAM   TAVR OPERATIVE NOTE   Date of Procedure:  04/17/2022  Preoperative Diagnosis: Severe Aortic Stenosis   Postoperative Diagnosis: Same   Procedure:   Transcatheter Aortic Valve Replacement - Percutaneous Right Transfemoral Approach  Edwards Sapien 3 Ultra Resilia THV (size 26 mm, model # 9755RSL, serial # [id number])   Co-Surgeons:  Gaye Pollack, MD and Sherren Mocha, MD    Anesthesiologist:  Wilfrid Lund, MD  Echocardiographer:  Jerilynn Mages. Croitoru, MD  Pre-operative Echo Findings: Severe aortic stenosis Normal left ventricular systolic function  Post-operative Echo Findings: No paravalvular leak Normal left ventricular systolic function   BRIEF CLINICAL NOTE AND INDICATIONS FOR SURGERY  This 56 year old gentleman presents with stage D, severe, symptomatic aortic stenosis with NYHA class IV symptoms of chest pain and shortness of breath at rest and ruled in for non-STEMI.  I have personally reviewed his 2D echocardiogram, cardiac catheterization, and CTA studies.  His echocardiogram shows a heavily calcified aortic valve with restricted leaflet mobility.  The mean gradient was 49 mmHg with a valve area of 0.52 cm consistent with severe aortic stenosis.  Cardiac catheterization showed mild nonobstructive coronary disease.  The mean gradient across the aortic valve was 52 mmHg with a peak gradient of 64 mmHg.  His gated cardiac CTA has not been officially read but appears to show a heavily calcified bicuspid aortic valve.  His abdominal and pelvic CTA shows severe diffuse aortoiliac atherosclerosis with a small infrarenal abdominal aortic aneurysm with a large amount of laminated thrombus within it.  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   I agree that aortic valve replacement is indicated in this patient for relief of his  symptoms and to prevent further left ventricular dysfunction and death.  Given his age of 89 he would normally be considered best treated with surgical aortic valve replacement.  His situation is more complicated due to multiple comorbidities including a massive abdominal wall hernia extending up to the xiphoid process and costal margins which would make the risk of sternotomy prohibitive.  He has some bowel extending up over the tip of the xiphoid process.  He could be considered for aortic valve replacement through a partial sternotomy or right anterior thoracotomy although I think that would result in difficult exposure and he may have obliteration of his pericardial space due to his prior surgeries for colon cancer and subsequent complications with abdominal wall hernias, mesh, and infection.  I think it is very likely that his pericardial space was entered which would result in adhesions.  An alternative incision would also not negate the following comorbidities including a 40+ year history of heavy smoking and continued smoking of 1-1/2 to 2 packs of cigarettes per day on multiple inhalers and as needed prednisone suggesting significant COPD.  PFT's showed severe obstruction and a moderate decrease in diffusion capacity.   We discussed his case at our multidisciplinary heart team meeting this morning and the consensus is that he is an acceptable candidate for TAVR. His gated cardiac CTA shows anatomy suitable for a Sapien 3 valve with adequate femoral vascular access.  He would like to proceed with TAVR. Dentistry did not see any signs of dental abscess although he has significant dental and periodontal disease that will require continued followup and treatment. I don't think he needs to have  his teeth extracted preop. We discussed complications that might develop including but not limited to risks of death, stroke, paravalvular leak, aortic dissection or other major vascular complications, aortic annulus  rupture, device embolization, cardiac rupture or perforation, mitral regurgitation, acute myocardial infarction, arrhythmia, heart block or bradycardia requiring permanent pacemaker placement, congestive heart failure, respiratory failure, renal failure, pneumonia, infection, other late complications related to structural valve deterioration or migration, or other complications that might ultimately cause a temporary or permanent loss of functional independence or other long term morbidity. The patient provides full informed consent for the procedure as described and all questions were answered.   DETAILS OF THE OPERATIVE PROCEDURE  PREPARATION:    The patient was brought to the operating room on the above mentioned date and appropriate monitoring was established by the anesthesia team. The patient was placed in the supine position on the operating table.  Intravenous antibiotics were administered. The patient was monitored closely throughout the procedure under conscious sedation.    Baseline transthoracic echocardiogram was performed. The patient's abdomen and both groins were prepped and draped in a sterile manner. A time out procedure was performed.   PERIPHERAL ACCESS:    Using the modified Seldinger technique, femoral arterial and venous access was obtained with placement of 6 Fr sheaths on the left side.  A pigtail diagnostic catheter was passed through the left arterial sheath under fluoroscopic guidance into the aortic root.  A temporary transvenous pacemaker catheter was passed through the left femoral venous sheath under fluoroscopic guidance into the right ventricle.  The pacemaker was tested to ensure stable lead placement and pacemaker capture. Aortic root angiography was performed in order to determine the optimal angiographic angle for valve deployment.   TRANSFEMORAL ACCESS:   Percutaneous transfemoral access and sheath placement was performed using ultrasound guidance.  The right  common femoral artery was cannulated using a micropuncture needle and appropriate location was verified using hand injection angiogram.  A pair of Abbott Perclose percutaneous closure devices were placed and a 6 French sheath replaced into the femoral artery.  The patient was heparinized systemically and ACT verified > 250 seconds.    A 14 Fr transfemoral E-sheath was introduced into the right common femoral artery after progressively dilating over an Amplatz superstiff wire. An AL-2 catheter was used to direct a straight-tip exchange length wire across the native aortic valve into the left ventricle. This was exchanged out for a pigtail catheter and position was confirmed in the LV apex. Simultaneous LV and Ao pressures were recorded.  The pigtail catheter was exchanged for a Safari wire in the LV apex.   BALLOON AORTIC VALVULOPLASTY:   Balloon aortic valvuloplasty was performed using a 20 mm True balloon.  Once optimal position was achieved, BAV was done under rapid ventricular pacing. The patient recovered well hemodynamically.    TRANSCATHETER HEART VALVE DEPLOYMENT:   An Edwards Sapien 3 Ultra transcatheter heart valve (size 26 mm) was prepared and crimped per manufacturer's guidelines, and the proper orientation of the valve is confirmed on the [employer] delivery system. The valve was advanced through the introducer sheath using normal technique until in an appropriate position in the abdominal aorta beyond the sheath tip. The balloon was then retracted and using the fine-tuning wheel was centered on the valve. The valve was then advanced across the aortic arch using appropriate flexion of the catheter. The valve was carefully positioned across the aortic valve annulus. The Commander catheter was retracted using normal technique. Once  final position of the valve has been confirmed by angiographic assessment, the valve is deployed during rapid ventricular pacing to maintain systolic blood  pressure < 50 mmHg and pulse pressure < 10 mmHg. The balloon inflation is held for >3 seconds after reaching full deployment volume. Once the balloon has fully deflated the balloon is retracted into the ascending aorta and valve function is assessed using echocardiography. There is felt to be no paravalvular leak and no central aortic insufficiency.  The patient's hemodynamic recovery following valve deployment is good.  The deployment balloon and guidewire are both removed.    PROCEDURE COMPLETION:   The sheath was removed and femoral artery closure performed.  Protamine was administered once femoral arterial repair was complete. The temporary pacemaker, pigtail catheter and femoral sheaths were removed with manual pressure used for venous hemostasis.  A Mynx femoral closure device was utilized following removal of the diagnostic sheath in the left femoral artery.  The patient tolerated the procedure well and is transported to the cath lab recovery area in stable condition. There were no immediate intraoperative complications. All sponge instrument and needle counts are verified correct at completion of the operation.   No blood products were administered during the operation.  The patient received a total of 40 mL of intravenous contrast during the procedure.   Gaye Pollack, MD 04/17/2022

## 2022-04-17 NOTE — Anesthesia Postprocedure Evaluation (Signed)
Anesthesia Post Note  Patient: Bob Ross  Procedure(s) Performed: Transcatheter Aortic Valve Replacement, Transfemoral (Chest) INTRAOPERATIVE TRANSTHORACIC ECHOCARDIOGRAM     Patient location during evaluation: ICU Anesthesia Type: MAC Level of consciousness: awake Pain management: pain level controlled Vital Signs Assessment: post-procedure vital signs reviewed and stable Respiratory status: spontaneous breathing Cardiovascular status: stable Postop Assessment: no apparent nausea or vomiting Anesthetic complications: no   No notable events documented.  Last Vitals:  Vitals:   04/17/22 1655 04/17/22 1700  BP: (!) 94/56 118/75  Pulse: (!) 54 (!) 49  Resp: 18 15  Temp:    SpO2: 96% 96%    Last Pain:  Vitals:   04/17/22 1650  TempSrc: Temporal  PainSc:                  Bob Ross

## 2022-04-17 NOTE — Plan of Care (Signed)
  Problem: Activity: Goal: Ability to tolerate increased activity will improve Outcome: Progressing   Problem: Activity: Goal: Ability to return to baseline activity level will improve Outcome: Progressing   Problem: Education: Goal: Knowledge of General Education information will improve Description: Including pain rating scale, medication(s)/side effects and non-pharmacologic comfort measures Outcome: Progressing

## 2022-04-17 NOTE — Progress Notes (Signed)
Patient arrived to 4E from Cath lab. Vitals taken and stable. Tele placed and CCMD notified. CHG completed. Groin sites assessed and level 0. Palpable. Pulses bilaterally. Call bell within reach. Family at the bedside.  Bob Ross

## 2022-04-17 NOTE — Op Note (Signed)
[facility]   MULTIDISCIPLINARY HEART VALVE TEAM   TAVR OPERATIVE NOTE   Date of Procedure:  04/17/2022  Preoperative Diagnosis: Severe Aortic Stenosis   Postoperative Diagnosis: Same   Procedure:   Transcatheter Aortic Valve Replacement - Percutaneous  Transfemoral Approach  Edwards Sapien 3 Ultra Resilia THV (size 26 mm, serial # [id number])   Co-Surgeons:  Gaye Pollack, MD and Sherren Mocha, MD  Anesthesiologist:  Suella Broad, MD  Echocardiographer:  Sanda Klein, MD  Pre-operative Echo Findings: Severe aortic stenosis Normal left ventricular systolic function  Post-operative Echo Findings: No paravalvular leak Normal/unchanged left ventricular systolic function  BRIEF CLINICAL NOTE AND INDICATIONS FOR SURGERY 56 year old gentleman with hypertension, hyperlipidemia, nonobstructive CAD, metastatic colon cancer in 2005 treated with radical surgery and chemotherapy, and recently diagnosed severe bicuspid valve aortic stenosis.  The patient presented this admission with non-STEMI and was found to have no obstructive CAD.  His symptoms were felt to be related to critical aortic stenosis.  Invasive hemodynamics showed a mean transvalvular gradient of 52 mmHg and 2D echo showed a mean gradient of 49 mmHg, peak gradient 74 mmHg, and aortic valve area of 0.5 cm.  LVEF by echo is 50 to 55%.  He was evaluated by cardiac surgery as a multidisciplinary team evaluation.  Because of his multiple abdominal surgeries and residual massive abdominal wall hernia extending up to the xiphoid process and costal margins, along with comorbidities of a 40+ year heavy tobacco history, significant COPD, and a single functioning kidney, he was felt to be best treated with TAVR.  During the course of the patient's preoperative work up they have been evaluated comprehensively by a multidisciplinary team of specialists coordinated through the [facility] in the  [facility].  They have been demonstrated to suffer from symptomatic severe aortic stenosis as noted above. The patient has been counseled extensively as to the relative risks and benefits of all options for the treatment of severe aortic stenosis including long term medical therapy, conventional surgery for aortic valve replacement, and transcatheter aortic valve replacement.  The patient has been independently evaluated in formal cardiac surgical consultation by Dr Cyndia Bent, who deemed the patient appropriate for TAVR. Based upon review of all of the patient's preoperative diagnostic tests they are felt to be candidate for transcatheter aortic valve replacement using the transfemoral approach as an alternative to conventional surgery.    Following the decision to proceed with transcatheter aortic valve replacement, a discussion has been held regarding what types of management strategies would be attempted intraoperatively in the event of life-threatening complications, including whether or not the patient would be considered a candidate for the use of cardiopulmonary bypass and/or conversion to open sternotomy for attempted surgical intervention.  The patient has been advised of a variety of complications that might develop peculiar to this approach including but not limited to risks of death, stroke, paravalvular leak, aortic dissection or other major vascular complications, aortic annulus rupture, device embolization, cardiac rupture or perforation, acute myocardial infarction, arrhythmia, heart block or bradycardia requiring permanent pacemaker placement, congestive heart failure, respiratory failure, renal failure, pneumonia, infection, other late complications related to structural valve deterioration or migration, or other complications that might ultimately cause a temporary or permanent loss of functional independence or other long term morbidity.  The patient provides full  informed consent for the procedure as described and all questions were answered preoperatively.  DETAILS OF THE OPERATIVE PROCEDURE  PREPARATION:  The patient is brought to the operating room on the above mentioned date and central monitoring was established by the anesthesia team including placement of a radial arterial line. The patient is placed in the supine position on the operating table.  Intravenous antibiotics are administered. The patient is monitored closely throughout the procedure under conscious sedation.  Baseline transthoracic echocardiogram is performed. The patient's chest, abdomen, both groins, and both lower extremities are prepared and draped in a sterile manner. A time out procedure is performed.   PERIPHERAL ACCESS:   Using ultrasound guidance, femoral arterial and venous access is obtained with placement of 6 Fr sheaths on the left side.  [place] images are digitally captured and stored in the patient's chart. A pigtail diagnostic catheter was passed through the femoral arterial sheath under fluoroscopic guidance into the aortic root.  A temporary transvenous pacemaker catheter was passed through the femoral venous sheath under fluoroscopic guidance into the right ventricle.  The pacemaker was tested to ensure stable lead placement and pacemaker capture. Aortic root angiography was performed in order to determine the optimal angiographic angle for valve deployment.  TRANSFEMORAL ACCESS:  A micropuncture technique is used to access the right femoral artery under fluoroscopic and ultrasound guidance.  2 Perclose devices are deployed at 10' and 2' positions to 'PreClose' the femoral artery. An 8 French sheath is placed and then an Amplatz Superstiff wire is advanced through the sheath. This is changed out for a 14 French transfemoral E-Sheath after progressively dilating over the Superstiff wire.  An AL-2 catheter was used to direct a straight-tip exchange length wire across the native  aortic valve into the left ventricle. This was exchanged out for a pigtail catheter and position was confirmed in the LV apex. Simultaneous LV and Ao pressures were recorded.  The pigtail catheter was exchanged for an Amplatz Extra-stiff wire in the LV apex.    BALLOON AORTIC VALVULOPLASTY:  Performed with a 20 mm true balloon during rapid ventricular pacing.  The patient's hemodynamic recovery is brisk.  TRANSCATHETER HEART VALVE DEPLOYMENT:  An Edwards Sapien 3 transcatheter heart valve (size 26 mm) was prepared and crimped per manufacturer's guidelines, and the proper orientation of the valve is confirmed on the [employer] delivery system. The valve was advanced through the introducer sheath using normal technique until in an appropriate position in the abdominal aorta beyond the sheath tip. The balloon was then retracted and using the fine-tuning wheel was centered on the valve. The valve was then advanced across the aortic arch using appropriate flexion of the catheter. The valve was carefully positioned across the aortic valve annulus. The Commander catheter was retracted using normal technique. Once final position of the valve has been confirmed by angiographic assessment, the valve is deployed while temporarily holding ventilation and during rapid ventricular pacing to maintain systolic blood pressure < 50 mmHg and pulse pressure < 10 mmHg. The balloon inflation is held for >3 seconds after reaching full deployment volume. Once the balloon has fully deflated the balloon is retracted into the ascending aorta and valve function is assessed using echocardiography. The patient's hemodynamic recovery following valve deployment is good.  The deployment balloon and guidewire are both removed. Echo demostrated acceptable post-procedural gradients, stable mitral valve function, and no aortic insufficiency.    PROCEDURE COMPLETION:  The sheath was removed and femoral artery closure is performed using  the 2 previously deployed Perclose devices.  Protamine is administered once femoral arterial repair was complete. The site is clear  with no evidence of bleeding or hematoma after the sutures are tightened. The temporary pacemaker and pigtail catheters are removed. Mynx closure is used for contralateral femoral arterial hemostasis for the 6 Fr sheath.  The patient tolerated the procedure well and is transported to the recovery area in stable condition. There were no immediate intraoperative complications. All sponge instrument and needle counts are verified correct at completion of the operation.   The patient received a total of 40 mL of intravenous contrast during the procedure.   Sherren Mocha, MD 04/17/2022 4:33 PM

## 2022-04-17 NOTE — Anesthesia Preprocedure Evaluation (Signed)
Anesthesia Evaluation  Patient identified by MRN, date of birth, ID band Patient awake    Reviewed: Allergy & Precautions, NPO status , Patient's Chart, lab work & pertinent test results  Airway Mallampati: III  TM Distance: >3 FB Neck ROM: Full    Dental  (+) Dental Advisory Given, Poor Dentition   Pulmonary Current Smoker,    breath sounds clear to auscultation       Cardiovascular hypertension, Pt. on medications + CAD  + Valvular Problems/Murmurs AS  Rhythm:Regular Rate:Normal + Systolic murmurs Echo: 1. Left ventricular ejection fraction, by estimation, is 50 to 55%. Left  ventricular ejection fraction by 2D MOD biplane is 52.6 %. The left  ventricle has low normal function. The left ventricle demonstrates  regional wall motion abnormalities (see  scoring diagram/findings for description). The left ventricular internal  cavity size was mildly dilated. Left ventricular diastolic parameters are  consistent with Grade I diastolic dysfunction (impaired relaxation).  Elevated left ventricular  end-diastolic pressure. There is mild hypokinesis of the left ventricular,  basal-mid inferior wall and inferolateral wall.  2. Right ventricular systolic function is normal. The right ventricular  size is normal.  3. The mitral valve is grossly normal. Trivial mitral valve  regurgitation.  4. The aortic valve is calcified. There is moderate calcification of the  aortic valve. Aortic valve regurgitation is not visualized. Severe aortic  valve stenosis. Aortic valve area, by VTI measures 0.52 cm. Aortic valve  mean gradient measures 48.7  mmHg. Aortic valve Vmax measures 4.29 m/s. DI is 0.16  5. The inferior vena cava is normal in size with greater than 50%  respiratory variability, suggest   Neuro/Psych  Neuromuscular disease negative psych ROS   GI/Hepatic negative GI ROS, Neg liver ROS,   Endo/Other  negative endocrine ROS   Renal/GU negative Renal ROS     Musculoskeletal negative musculoskeletal ROS (+)   Abdominal Normal abdominal exam  (+)   Peds  Hematology negative hematology ROS (+)   Anesthesia Other Findings   Reproductive/Obstetrics                             Anesthesia Physical Anesthesia Plan  ASA: 4  Anesthesia Plan: MAC   Post-op Pain Management:    Induction: Intravenous  PONV Risk Score and Plan: 0 and Ondansetron  Airway Management Planned: Natural Airway and Simple Face Mask  Additional Equipment: None  Intra-op Plan:   Post-operative Plan:   Informed Consent: I have reviewed the patients History and Physical, chart, labs and discussed the procedure including the risks, benefits and alternatives for the proposed anesthesia with the patient or authorized representative who has indicated his/her understanding and acceptance.       Plan Discussed with: CRNA  Anesthesia Plan Comments:         Anesthesia Quick Evaluation

## 2022-04-17 NOTE — Anesthesia Procedure Notes (Signed)
Procedure Name: MAC Date/Time: 04/17/2022 2:50 PM  Performed by: Carolan Clines, CRNAPre-anesthesia Checklist: Patient identified, Emergency Drugs available, Suction available and Patient being monitored Patient Re-evaluated:Patient Re-evaluated prior to induction Oxygen Delivery Method: Simple face mask Dental Injury: Teeth and Oropharynx as per pre-operative assessment

## 2022-04-17 NOTE — Progress Notes (Addendum)
LOCATION: Left Radial  DRESSING APPLIED: A-line removed by Joaquin Bend., RN, gauze with tegaderm placed after removal, manual pressure held for approximately 6 to 7 minutes  SITE UPON ARRIVAL: LEVEL 0  SITE AFTER BAND REMOVAL: LEVEL 0  CIRCULATION SENSATION AND MOVEMENT: +2 radial, + movement noted  COMMENTS: see previous note entered at [employer]

## 2022-04-17 NOTE — Progress Notes (Addendum)
Dr Burt Knack to bedside, pt states he feels hot from the inside, blankets removed from pt, cool washcloth to forehead,  bed placed in trendelenburg position, no c/o pain or nausea, see flowsheets for VS, safety maintained, no new orders at this time

## 2022-04-18 ENCOUNTER — Inpatient Hospital Stay (HOSPITAL_COMMUNITY): Payer: BC Managed Care – PPO

## 2022-04-18 ENCOUNTER — Other Ambulatory Visit (HOSPITAL_COMMUNITY): Payer: Self-pay

## 2022-04-18 ENCOUNTER — Encounter (HOSPITAL_COMMUNITY): Payer: Self-pay | Admitting: Cardiovascular Disease

## 2022-04-18 DIAGNOSIS — I214 Non-ST elevation (NSTEMI) myocardial infarction: Secondary | ICD-10-CM | POA: Diagnosis not present

## 2022-04-18 DIAGNOSIS — I350 Nonrheumatic aortic (valve) stenosis: Secondary | ICD-10-CM | POA: Diagnosis not present

## 2022-04-18 DIAGNOSIS — Z952 Presence of prosthetic heart valve: Secondary | ICD-10-CM

## 2022-04-18 DIAGNOSIS — Z20822 Contact with and (suspected) exposure to covid-19: Secondary | ICD-10-CM | POA: Diagnosis not present

## 2022-04-18 DIAGNOSIS — Z006 Encounter for examination for normal comparison and control in clinical research program: Secondary | ICD-10-CM | POA: Diagnosis not present

## 2022-04-18 LAB — ECHOCARDIOGRAM COMPLETE
AR max vel: 1.74 cm2
AV Area VTI: 1.78 cm2
AV Area mean vel: 1.65 cm2
AV Mean grad: 12.3 mmHg
AV Peak grad: 22.8 mmHg
Ao pk vel: 2.39 m/s
Area-P 1/2: 1.92 cm2
Height: 71 in
MV VTI: 2.95 cm2
S' Lateral: 3.4 cm
Weight: 3506.2 oz

## 2022-04-18 LAB — CBC
HCT: 48.4 % (ref 39.0–52.0)
Hemoglobin: 15.8 g/dL (ref 13.0–17.0)
MCH: 30.7 pg (ref 26.0–34.0)
MCHC: 32.6 g/dL (ref 30.0–36.0)
MCV: 94 fL (ref 80.0–100.0)
Platelets: 86 10*3/uL — ABNORMAL LOW (ref 150–400)
RBC: 5.15 MIL/uL (ref 4.22–5.81)
RDW: 14.1 % (ref 11.5–15.5)
WBC: 11.3 10*3/uL — ABNORMAL HIGH (ref 4.0–10.5)
nRBC: 0 % (ref 0.0–0.2)

## 2022-04-18 LAB — BASIC METABOLIC PANEL
Anion gap: 11 (ref 5–15)
BUN: 22 mg/dL — ABNORMAL HIGH (ref 6–20)
CO2: 18 mmol/L — ABNORMAL LOW (ref 22–32)
Calcium: 8.9 mg/dL (ref 8.9–10.3)
Chloride: 108 mmol/L (ref 98–111)
Creatinine, Ser: 1.22 mg/dL (ref 0.61–1.24)
GFR, Estimated: >60 mL/min (ref >60)
Glucose, Bld: 104 mg/dL — ABNORMAL HIGH (ref 70–99)
Potassium: 4.4 mmol/L (ref 3.5–5.1)
Sodium: 137 mmol/L (ref 135–145)

## 2022-04-18 LAB — MAGNESIUM: Magnesium: 1.9 mg/dL (ref 1.7–2.4)

## 2022-04-18 MED ORDER — ICOSAPENT ETHYL 1 G PO CAPS
2.0000 g | ORAL_CAPSULE | Freq: Two times a day (BID) | ORAL | 3 refills | Status: DC
  Filled 2022-04-18: qty 120, 30d supply, fill #0

## 2022-04-18 MED ORDER — ICOSAPENT ETHYL 1 G PO CAPS: 2.0000 g | ORAL_CAPSULE | Freq: Two times a day (BID) | ORAL | 3 refills | Status: DC

## 2022-04-18 MED ORDER — METOPROLOL SUCCINATE ER 25 MG PO TB24: 12.5000 mg | ORAL_TABLET | Freq: Every day | ORAL | 6 refills | Status: DC

## 2022-04-18 MED ORDER — METOPROLOL SUCCINATE ER 25 MG PO TB24
12.5000 mg | ORAL_TABLET | Freq: Every day | ORAL | 6 refills | Status: DC
  Filled 2022-04-18: qty 30, 60d supply, fill #0

## 2022-04-18 MED FILL — Potassium Chloride Inj 2 mEq/ML: INTRAVENOUS | Qty: 40 | Status: AC

## 2022-04-18 MED FILL — Magnesium Sulfate Inj 50%: INTRAMUSCULAR | Qty: 10 | Status: AC

## 2022-04-18 MED FILL — Heparin Sodium (Porcine) Inj 1000 Unit/ML: Qty: 1000 | Status: AC

## 2022-04-18 NOTE — Progress Notes (Signed)
  Echocardiogram 2D Echocardiogram has been performed.  Bob Ross 04/18/2022, 8:43 AM

## 2022-04-18 NOTE — Progress Notes (Signed)
Patient given discharge instructions and stated understanding. 

## 2022-04-18 NOTE — Discharge Summary (Addendum)
[facility] VALVE TEAM  Discharge Summary    Patient ID: Bob Ross MRN: [id number]; DOB: 10-Jan-1966  Admit date: 04/09/2022 Discharge date: 04/18/2022  Primary Care Provider: Coolidge Breeze, FNP  Primary Cardiologist: Quay Burow, MD / Dr. Burt Knack & Dr. Cyndia Bent (TAVR)  Discharge Diagnoses    Principal Problem:   Non-ST elevation (NSTEMI) myocardial infarction [facility]) Active Problems:   Chest pain   Thrombocytopenia (HCC)   Tobacco abuse   Caries   Accretions on teeth   Chronic periodontitis   Severe aortic stenosis   S/P TAVR (transcatheter aortic valve replacement)   Allergies Allergies  Allergen Reactions   Tiotropium Bromide Monohydrate Anaphylaxis    spiriva    Diagnostic Studies/Procedures    Left Cardiac Catheterization 04/09/2022: 1.  Mild obstructive coronary artery disease. 2.  Severely elevated LVEDP of 29 mmHg. 3.  Heavily calcified aortic valve with a mean gradient of 52 mmHg and a peak to peak gradient of 62 mmHg consistent with severe aortic stenosis; aortic waveforms demonstrated parvus and tardus. 4.  Preserved left ventricular ejection fraction.   Recommendation: Echocardiogram to evaluate for severe aortic stenosis which will inform therapy moving forward.   Diagnostic Dominance: Right  _______________   Echocardiogram 04/10/2022: Impressions: 1. Left ventricular ejection fraction, by estimation, is 50 to 55%. Left  ventricular ejection fraction by 2D MOD biplane is 52.6 %. The left  ventricle has low normal function. The left ventricle demonstrates  regional wall motion abnormalities (see  scoring diagram/findings for description). The left ventricular internal  cavity size was mildly dilated. Left ventricular diastolic parameters are  consistent with Grade I diastolic dysfunction (impaired relaxation).  Elevated left ventricular  end-diastolic pressure. There is mild hypokinesis of the left  ventricular,  basal-mid inferior wall and inferolateral wall.   2. Right ventricular systolic function is normal. The right ventricular  size is normal.   3. The mitral valve is grossly normal. Trivial mitral valve  regurgitation.   4. The aortic valve is calcified. There is moderate calcification of the  aortic valve. Aortic valve regurgitation is not visualized. Severe aortic  valve stenosis. Aortic valve area, by VTI measures 0.52 cm. Aortic valve  mean gradient measures 48.7  mmHg. Aortic valve Vmax measures 4.29 m/s. DI is 0.16   5. The inferior vena cava is normal in size with greater than 50%  respiratory variability, suggesting right atrial pressure of 3 mmHg.   Comparison(s): No prior Echocardiogram.    _______________   Coronary CTA 04/10/2022: 1. Bicuspid aortic valve with fusion of right and noncoronary cusps. Severe calcifications (AV calcium score 4864) 2. Aortic annulus measures 21m x 255mwith perimeter 8616mnd area 579 mm^2. Moderate annular calcifications adjacent to right coronary cusp and extending into LVOT. Annular measurements are suitable for delivery of 69m31mwards Sapien 3 valve 3. Sufficient coronary to annulus distance, measuring 14mm14mleft main and 14mm 75mCA 4. Optimum Fluoroscopic Angle for Delivery:  RAO 3 CRA 9 5. Coronary calcium score 757 (98th percentile)   _____________  TAVR OPERATIVE NOTE     Date of Procedure:                04/17/2022   Preoperative Diagnosis:      Severe Aortic Stenosis    Postoperative Diagnosis:    Same    Procedure:        Transcatheter Aortic Valve Replacement - Percutaneous  Transfemoral Approach  Edwards Sapien 3 Ultra Resilia THV (size 26 mm, serial # [id number])              Co-Surgeons:                        Gaye Pollack, MD and Sherren Mocha, MD   Anesthesiologist:                  Suella Broad, MD   Echocardiographer:              Sanda Klein, MD   Pre-operative Echo  Findings: Severe aortic stenosis Normal left ventricular systolic function   Post-operative Echo Findings: No paravalvular leak Normal/unchanged left ventricular systolic function _____________    Echo 04/18/22: completed but pending formal read at the time of discharge   History of Present Illness     Bob Ross is a 56 y.o. 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   History includes nonobstructive CAD documented at cardiac catheterization in 2015 at [facility] and more recently in 2021 through the [facility] cardiology system.  He had evidence of a cardiomyopathy with LVEF 40% at cardiac catheterization in 2021, although has not had regular follow-up for this.  He presented to [facility] ED on 04/09/22 with chest pain. 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  He was not doing anything strenuous at the time. Symptoms persisted, associated with diaphoresis and sense of breathlessness.  He does not report any preceding symptoms at baseline, follows with PCP in [facility].  He was treated with nitroglycerin, morphine, ultimately IV nitroglycerin and heparin with improvement in chest pain but still present. He was transferred to [facility] for coronary angiography and further work up.  Hospital Course     Consultants: TCTS   NTSTEMI: hs troponin 489 >> 1,146. LHC on 8/16 showed mild non-obstructive CAD but severe aortic stenosis and severely elevated LVEDP. Echo showed LVEF of 50-55% with hypokinesis of the anterolateral wall and posterior wall.  -- No recurrent chest  pain. -- Continue Aspirin '81mg'$  daily and Crestor '20mg'$  daily.   Severe aortic stenosis: echo this admission showed EF 50-55% and severe AS with a mean grad 48.7 mmHg, peak grad 73.7 mmHg, AVA 0.55 cm2, DVI 0.16, SVI 28.  -- Given numerous abdominal surgeries with mesh and prolonged healing, severe COPD by PFTs and patient/family preference, he underwent successful TAVR with a 26 mm Edwards S3UR via the TF approach on 04/17/22 by Dr. Burt Knack and Dr. Cyndia Bent.    Poor dentition: seen by Dr. Benson Norway and needs a near full mouth extraction. This will be deferred after TAVR given unstable presentation with NSTEMI.    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 Echo this admission shows LVEF of 50-55% and grade 1 diastolic dysfunction. LVEDP was 29 on cardiac catheterization and he received one dose of IV Lasix following this with good response. He is now felt to be evuolemic.  -- Resume home Lisinopril '10mg'$  daily and add Toprol XL 12.'5mg'$  daily.    HTN: Bp well  controlled. As above resumed home Lisinopril '10mg'$  daily and add Toprol XL 12.'5mg'$  daily.    HLD: Lipid panel this admission: TC 126, TG 309, HDL 36, LDL 28. -- On Lipitor '40mg'$  daily at home but switched to Crestor '20mg'$  daily here. Also started him on Vascepa 2g twice daily.   Pre-Diabetes: hemoglobin A1c 6.2 this admission.    COPD/Tobacco Abuse: treated with nicotine patches. Counseled on complete cessation. PFTs with severe COPD  AAA: pre TAVR CTs showed a 3.1 cm infrarenal abdominal aortic aneurysm. Recommended [place] q3 years. This will be followed over time.  _____________  Discharge Vitals Blood pressure 118/66, pulse 66, temperature 98 F (36.7 C), temperature source Oral, resp. rate 16, height '5\' 11"'$  (1.803 m), weight 99.4 kg, SpO2 95 %.  Filed Weights   04/09/22 1349 04/10/22 0606 04/18/22 0500  Weight: 100.7 kg 100.1 kg 99.4 kg     GEN: Well nourished, well developed, in no acute distress HEENT: normal Neck:  no JVD or masses Cardiac: RRR; no murmurs, rubs, or gallops,no edema  Respiratory:  clear to auscultation bilaterally, normal work of breathing GI: soft, nontender, nondistended, + BS MS: no deformity or atrophy Skin: warm and dry, no rash.  Groin sites clear without hematoma or ecchymosis. Some blood on bandage  on left.  Neuro:  Alert and Oriented x 3, Strength and sensation are intact Psych: euthymic mood, full affect   Labs & Radiologic Studies    CBC Recent Labs    04/17/22 0344 04/17/22 1649 04/18/22 0327  WBC 7.7  --  11.3*  HGB 15.9 15.6 15.8  HCT 46.1 46.0 48.4  MCV 91.7  --  94.0  PLT 91*  --  86*   Basic Metabolic Panel Recent Labs    04/17/22 0344 04/17/22 1649 04/18/22 0327  NA 139 141 137  K 4.4 4.6 4.4  CL 109 107 108  CO2 22  --  18*  GLUCOSE 102* 95 104*  BUN 18 20 22*  CREATININE 1.16 1.10 1.22  CALCIUM 9.0  --  8.9  MG  --   --  1.9   Liver Function Tests No results for input(s): "AST", "ALT", "ALKPHOS", "BILITOT", "PROT", "ALBUMIN" in the last 72 hours. No results for input(s): "LIPASE", "AMYLASE" in the last 72 hours. Cardiac Enzymes No results for input(s): "CKTOTAL", "CKMB", "CKMBINDEX", "TROPONINI" in the last 72 hours. BNP Invalid input(s): "POCBNP" D-Dimer No results for input(s): "DDIMER" in the last 72 hours. Hemoglobin A1C No results for input(s): "HGBA1C" in the last 72 hours. Fasting Lipid Panel No results for input(s): "CHOL", "HDL", "LDLCALC", "TRIG", "CHOLHDL", "LDLDIRECT" in the last 72 hours. Thyroid Function Tests No results for input(s): "TSH", "T4TOTAL", "T3FREE", "THYROIDAB" in the last 72 hours.  Invalid input(s): "FREET3" _____________  Structural Heart Procedure  Result Date: 04/17/2022 See surgical note for result.  ECHOCARDIOGRAM LIMITED  Result Date: 04/17/2022    ECHOCARDIOGRAM LIMITED REPORT   Patient Name:   TAMMIE ELLSWORTH  Date of Exam: 04/17/2022 Medical Rec #:  [id number]  Height:       71.0 in Accession #:     [id number] Weight:       220.6 lb Date of Birth:  1966/01/08   BSA:          2.198 m Patient Age:    71 years   BP:           121/81 mmHg Patient Gender: M          HR:  65 bpm. Exam Location:  Inpatient Procedure: Limited Echo, Color Doppler and Cardiac Doppler Indications:     Aortic Stenosis i35.0  History:         Patient has prior history of Echocardiogram examinations, most                  recent 04/10/2022. CAD; Risk Factors:Hypertension and                  Dyslipidemia.                  Aortic Valve: 26 mm Sapien prosthetic, stented (TAVR) valve is                  present in the aortic position.  Sonographer:     Raquel Sarna Senior RDCS Referring Phys:  [id number] Eileen Stanford Diagnosing Phys: Sanda Klein MD  Sonographer Comments: 69m Edwards Sapien [phone] Implanted IMPRESSIONS  1. Left ventricular ejection fraction, by estimation, is 50 to 55%. The left ventricle has low normal function. Left ventricular endocardial border not optimally defined to evaluate regional wall motion.  2. Right ventricular systolic function was not well visualized.  3. Left atrial size was mildly dilated.  4. The mitral valve is normal in structure. No evidence of mitral valve regurgitation.  5. The aortic valve is bicuspid. Aortic valve regurgitation is not visualized. Severe aortic valve stenosis. There is a 26 mm Sapien prosthetic (TAVR) valve present in the aortic position. Aortic valve mean gradient measures 6.0 mmHg. Aortic valve Vmax measures 1.73 m/s. Aortic valve acceleration time measures 63 msec. FINDINGS  Left Ventricle: Left ventricular ejection fraction, by estimation, is 50 to 55%. The left ventricle has low normal function. Left ventricular endocardial border not optimally defined to evaluate regional wall motion. Right Ventricle: Right ventricular systolic function was not well visualized. Left Atrium: Left atrial size was mildly dilated. Right Atrium: Right atrial size was not well visualized.  Pericardium: There is no evidence of pericardial effusion. Mitral Valve: The mitral valve is normal in structure. Tricuspid Valve: The tricuspid valve is not well visualized. Aortic Valve: The aortic valve is bicuspid. Aortic valve regurgitation is not visualized. Severe aortic stenosis is present. Aortic valve mean gradient measures 6.0 mmHg. Aortic valve peak gradient measures 12.0 mmHg. Aortic valve area, by VTI measures 2.63 cm. There is a 26 mm Sapien prosthetic, stented (TAVR) valve present in the aortic position. Pulmonic Valve: The pulmonic valve was not well visualized. Aorta: The aortic root is normal in size and structure. LEFT VENTRICLE PLAX 2D LVOT diam:     2.20 cm LV SV:         71 LV SV Index:   33 LVOT Area:     3.80 cm  AORTIC VALVE AV Area (Vmax):    2.22 cm AV Area (Vmean):   2.10 cm AV Area (VTI):     2.63 cm AV Vmax:           173.00 cm/s AV Vmean:          116.000 cm/s AV VTI:            0.272 m AV Peak Grad:      12.0 mmHg AV Mean Grad:      6.0 mmHg LVOT Vmax:         101.00 cm/s LVOT Vmean:        64.000 cm/s LVOT VTI:          0.188 m LVOT/AV  VTI ratio: 0.69  SHUNTS Systemic VTI:  0.19 m Systemic Diam: 2.20 cm Sanda Klein MD Electronically signed by Sanda Klein MD Signature Date/Time: 04/17/2022/4:10:29 PM    Final    DG Chest 2 View  Result Date: 04/16/2022 CLINICAL DATA:  Preop. EXAM: CHEST - 2 VIEW COMPARISON:  Radiograph 04/09/2022.  Chest CT 04/10/2022 FINDINGS: Right pleural thickening is seen lateral hemithorax. There is slight increase in blunting of costophrenic angle on the right which may be due to small effusion. Bandlike scarring in the right lung again seen. The heart is normal in size, stable mediastinal contours. No pulmonary edema or pneumothorax. There is colonic interposition into the diaphragm anteriorly. IMPRESSION: 1. Right pleural thickening, unchanged from prior. 2. Slight increase in blunting of the right costophrenic angle, may be due to small  effusion. Bandlike scarring in the right lung. Electronically Signed   By: Keith Rake M.D.   On: 04/16/2022 22:31   DG Orthopantogram  Result Date: 04/11/2022 CLINICAL DATA:  Preoperative assessment for cardiac surgery EXAM: ORTHOPANTOGRAM/PANORAMIC COMPARISON:  None Available. FINDINGS: Panorex view of the mandible was obtained. There are numerous missing teeth. Erosive changes are seen just below the crowns of the left upper canine (tooth 11), left inferior molar (tooth 19) and right inferior molar (tooth 30). No bony erosive changes. Visualized sinuses are clear. IMPRESSION: Dental caries as above.  No acute or destructive bony lesions. Electronically Signed   By: Randa Ngo M.D.   On: 04/11/2022 08:31   CT CORONARY MORPH W/CTA COR W/SCORE W/CA W/CM &/OR WO/CM  Addendum Date: 04/10/2022   ADDENDUM REPORT: 04/10/2022 22:10 CLINICAL DATA:  11 -year-old male with severe aortic stenosis being evaluated for a TAVR procedure. EXAM: Cardiac TAVR CT TECHNIQUE: The patient was scanned on a [employer]. A 120 kV retrospective scan was triggered in the descending thoracic aorta at 111 HU's. Gantry rotation speed was 250 msecs and collimation was .6 mm. No beta blockade or nitro were given. The 3D data set was reconstructed in 5% intervals of the R-R cycle. Systolic and diastolic phases were analyzed on a dedicated work station using MPR, MIP and VRT modes. The patient received 100 cc of contrast. FINDINGS: Aortic Root: Aortic valve: Bicuspid aortic valve with fusion of right and noncoronary cusps Aortic valve calcium score: 4864 Aortic annulus: Diameter: 12m x 288mPerimeter: 8671mrea: 579 mm^2 Calcifications: Moderate calcification adjacent to right coronary cusp Coronary height: Min Left - 70m42max Left - 21mm2mn Right - 70mm 41mtubular height: Left cusp - 25mm; 67mt cusp - 20mm; N62mronary cusp - 22mm LVO52ms measured 3 mm below the annulus): Diameter: 30mm x 265mrea:58mmm^2  Cal37mcations: Moderate calcification inferior to RCC Aortic sinus width: Left cusp - 35mm; Right 71m - 31mm; Noncoro53m cusp - 31mm Sinotubul42munction width: 31mm x 29mm Opt27m Flu6mcopic Angle for Delivery: RAO 3 CRA 9 Cardiac: Right atrium: Normal size Right ventricle: Normal size Pulmonary arteries: Normal size Pulmonary veins: Normal configuration Left atrium: Mild enlargement Left ventricle: Normal size Pericardium: Normal thickness Coronary arteries: Coronary calcium score 757 (98th percentile) IMPRESSION: 1. Bicuspid aortic valve with fusion of right and noncoronary cusps. Severe calcifications (AV calcium score 4864) 2. Aortic annulus measures 28mm x 28mm with 51mmete74mmm and area 579 m2m Moderate annular calcifications adjacent to right coronary cusp and extending into LVOT. Annular measurements are suitable for delivery of 29mm Edwards Sapien 89mlve 3. Sufficient coronary to annulus distance, measuring 70mm to left main and61m  71m to RCA 4. Optimum Fluoroscopic Angle for Delivery:  RAO 3 CRA 9 5. Coronary calcium score 757 (98th percentile) Electronically Signed   By: COswaldo MilianM.D.   On: 04/10/2022 22:10   Result Date: 04/10/2022 EXAM: OVER-READ INTERPRETATION  CT CHEST The following report is a limited chest CT over-read performed by radiologist Dr. LYetta Glassmanof [facility], [place] 04/10/2022. This over-read does not include interpretation of cardiac or coronary anatomy or pathology. The cardiac TAVR interpretation by the cardiologist is attached. FINDINGS: Extracardiac findings will be described separately under dictation for contemporaneously obtained CTA chest, abdomen and pelvis. IMPRESSION: Please see separate dictation for contemporaneously obtained CTA chest, abdomen and pelvis dated 04/10/2022 for full description of relevant extracardiac findings. Electronically Signed: By: LYetta GlassmanM.D. On: 04/10/2022 15:48   CT ANGIO ABDOMEN PELVIS  W &/OR WO  CONTRAST  Result Date: 04/10/2022 CLINICAL DATA:  Preop evaluation for aortic valve replacement EXAM: CTA ABDOMEN AND PELVIS WITHOUT AND WITH CONTRAST TECHNIQUE: Multidetector CT imaging of the abdomen and pelvis was performed using the standard protocol during bolus administration of intravenous contrast. Multiplanar reconstructed images and MIPs were obtained and reviewed to evaluate the vascular anatomy. RADIATION DOSE REDUCTION: This exam was performed according to the departmental dose-optimization program which includes automated exposure control, adjustment of the mA and/or kV according to patient size and/or use of iterative reconstruction technique. CONTRAST:  868mOMNIPAQUE IOHEXOL 350 MG/ML SOLN COMPARISON:  CT abdomen and pelvis dated October 03, 2016 FINDINGS: CTA CHEST FINDINGS Cardiovascular: Normal heart size. No pericardial effusion. Aortic valve thickening and calcifications. Normal caliber thoracic aorta with mild atherosclerotic disease. Standard three-vessel aortic arch with no significant stenosis. No suspicious central filling defects of the pulmonary arteries. Mediastinum/Nodes: Esophagus and thyroid are unremarkable. Calcified mediastinal and right hilar lymph nodes, likely sequela of prior granulomatous infection. No pathologically enlarged lymph nodes seen in the chest. Lungs/Pleura: Central airways are patent. Mild centrilobular emphysema. Smooth right pleural thickening. Linear opacities of the right lung base, likely due to scarring. Musculoskeletal: No chest wall abnormality. No acute or significant osseous findings. CTA ABDOMEN AND PELVIS FINDINGS Hepatobiliary: Unchanged linear calcification of the hepatic lobe, possibly due to prior intervention granulomatous disease. Gallstone with no gallbladder wall thickening. No biliary ductal dilation. Pancreas: Unremarkable. No pancreatic ductal dilatation or surrounding inflammatory changes. Spleen: Normal in size without focal  abnormality. Adrenals/Urinary Tract: Bilateral adrenal glands are unremarkable. Severely atrophic right kidney. No hydronephrosis. Scattered low-attenuation lesions of the left kidney, largest is compatible with a simple cysts, others are too small to completely characterize, no further follow-up imaging is recommended. Stomach/Bowel: Stomach is within normal limits. Prior partial resection of the sigmoid colon. No evidence of bowel wall thickening, distention, or inflammatory changes. Vascular/lymphatic: Calcified abdominal lymph nodes of the gastrohepatic ligament, unchanged when compared with prior exam. No enlarged noncalcified lymph nodes seen in the abdomen or pelvis. Severe calcified and noncalcified plaque of the abdominal aorta. Dilated infrarenal abdominal aorta, measuring up to 3.1 cm. 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 Reproductive: Prostatomegaly, measuring up to 4.8 cm. Other: Stable linear calcified structure of the right lower quadrant seen on series 4, image 197. large amount of rectus diastasis, similar to prior. Prior ventral abdominal wall hernia repair with small defect of the left lower quadrant seen on series 4, image 191, unchanged when compared prior. Musculoskeletal: No  acute or significant osseous findings. VASCULAR MEASUREMENTS PERTINENT TO TAVR: AORTA: Minimal Aortic Diameter -  16.5 mm Severity of Aortic Calcification-moderate RIGHT PELVIS: Right Common Iliac Artery - Minimal Diameter-8.1 mm Tortuosity-severe Calcification-moderate Right External Iliac Artery - Minimal Diameter-7.9 mm Tortuosity-mild Calcification-mild Right Common Femoral Artery - Minimal Diameter-6.1 mm Tortuosity-mild Calcification-mild LEFT PELVIS: Left Common Iliac Artery - Minimal Diameter-8.4 mm Tortuosity-moderate Calcification-moderate Left External Iliac Artery -  Minimal Diameter-7.1 mm Tortuosity-none Calcification-mild Left Common Femoral Artery - Minimal Diameter-6.8 mm Tortuosity-none Calcification-mild Review of the MIP images confirms the above findings. IMPRESSION: 1. Vascular findings and measurements pertinent to potential TAVR procedure, as detailed above. 2. Thickening and calcification of the aortic valve, compatible with reported clinical history of aortic stenosis. 3. 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 Left main and 3 vessel coronary artery disease. 4. Infrarenal abdominal aortic aneurysm measuring up to 3.1 cm. Recommend follow-up ultrasound every 3 years. This recommendation follows ACR consensus guidelines: White Paper of the ACR Incidental Findings Committee II on Vascular Findings. J Am Coll Radiol 2013; 10:789-794. 5. 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 Electronically Signed   By: Yetta Glassman M.D.   On: 04/10/2022 15:41   CT ANGIO CHEST AORTA W/CM & OR WO/CM  Result Date: 04/10/2022 CLINICAL DATA:  Preop evaluation for aortic valve replacement EXAM: CTA ABDOMEN AND PELVIS WITHOUT AND WITH CONTRAST TECHNIQUE: Multidetector CT imaging of the abdomen and pelvis was performed using the standard protocol during bolus administration of intravenous contrast. Multiplanar reconstructed images and MIPs were obtained and reviewed to evaluate the vascular anatomy. RADIATION DOSE REDUCTION: This exam was performed according to the departmental dose-optimization program which includes automated exposure control, adjustment of the mA and/or kV according to patient size and/or use of iterative reconstruction technique. CONTRAST:  17m OMNIPAQUE IOHEXOL 350 MG/ML SOLN COMPARISON:  CT abdomen and pelvis dated October 03, 2016 FINDINGS: CTA CHEST FINDINGS Cardiovascular: Normal heart size. No pericardial effusion. Aortic valve thickening and calcifications. Normal caliber thoracic aorta with mild  atherosclerotic disease. Standard three-vessel aortic arch with no significant stenosis. No suspicious central filling defects of the pulmonary arteries. Mediastinum/Nodes: Esophagus and thyroid are unremarkable. Calcified mediastinal and right hilar lymph nodes, likely sequela of prior granulomatous infection. No pathologically enlarged lymph nodes seen in the chest. Lungs/Pleura: Central airways are patent. Mild centrilobular emphysema. Smooth right pleural thickening. Linear opacities of the right lung base, likely due to scarring. Musculoskeletal: No chest wall abnormality. No acute or significant osseous findings. CTA ABDOMEN AND PELVIS FINDINGS Hepatobiliary: Unchanged linear calcification of the hepatic lobe, possibly due to prior intervention granulomatous disease. Gallstone with no gallbladder wall thickening. No biliary ductal dilation. Pancreas: Unremarkable. No pancreatic ductal dilatation or surrounding inflammatory changes. Spleen: Normal in size without focal abnormality. Adrenals/Urinary Tract: Bilateral adrenal glands are unremarkable. Severely atrophic right kidney. No hydronephrosis. Scattered low-attenuation lesions of the left kidney, largest is compatible with a simple cysts, others are too small to completely characterize, no further follow-up imaging is recommended. Stomach/Bowel: Stomach is within normal limits. Prior partial resection of the sigmoid colon. No evidence of bowel wall thickening, distention, or inflammatory changes. Vascular/lymphatic: Calcified abdominal lymph nodes of the gastrohepatic ligament, unchanged when compared with prior exam. No enlarged noncalcified lymph nodes seen in the abdomen or pelvis. Severe calcified and noncalcified plaque of the abdominal aorta. Dilated infrarenal abdominal aorta, measuring up to 3.1 cm. 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 Reproductive: Prostatomegaly, measuring up to 4.8 cm. Other: Stable linear calcified structure of the right lower quadrant seen on series 4, image 197. large amount of rectus diastasis, similar to prior. Prior ventral abdominal wall hernia repair with small defect of the left lower quadrant seen on series 4, image 191, unchanged when compared prior. Musculoskeletal: No acute or significant osseous findings. VASCULAR MEASUREMENTS PERTINENT TO TAVR: AORTA: Minimal Aortic Diameter -  16.5 mm Severity of Aortic Calcification-moderate RIGHT PELVIS: Right Common Iliac Artery - Minimal Diameter-8.1 mm Tortuosity-severe Calcification-moderate Right External Iliac Artery - Minimal Diameter-7.9 mm Tortuosity-mild Calcification-mild Right Common Femoral Artery - Minimal Diameter-6.1 mm Tortuosity-mild Calcification-mild LEFT PELVIS: Left Common Iliac Artery - Minimal Diameter-8.4 mm Tortuosity-moderate Calcification-moderate Left External Iliac Artery - Minimal Diameter-7.1 mm Tortuosity-none Calcification-mild Left Common Femoral Artery - Minimal Diameter-6.8 mm Tortuosity-none Calcification-mild Review of the MIP images confirms the above findings. IMPRESSION: 1. Vascular findings and measurements pertinent to potential TAVR procedure, as detailed above. 2. Thickening and calcification of the aortic valve, compatible with reported clinical history of aortic stenosis. 3. 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 Left main and 3 vessel coronary artery disease. 4. Infrarenal abdominal aortic aneurysm measuring up to 3.1 cm. Recommend follow-up ultrasound every 3 years. This recommendation follows ACR consensus guidelines: White Paper of the ACR Incidental Findings Committee II on Vascular Findings. J Am Coll Radiol 2013; 10:789-794. 5. 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 Electronically Signed   By: Yetta Glassman M.D.   On:  04/10/2022 15:41   ECHOCARDIOGRAM COMPLETE  Result Date: 04/10/2022    ECHOCARDIOGRAM REPORT   Patient Name:   KADARRIUS YANKE  Date of Exam: 04/10/2022 Medical Rec #:  [id number]  Height:       71.0 in Accession #:    [id number] Weight:       220.6 lb Date of Birth:  06-22-1966   BSA:          2.198 m Patient Age:    34 years   BP:           100/74 mmHg Patient Gender: M          HR:           65 bpm. Exam Location:  Inpatient Procedure: 2D Echo, Cardiac Doppler and Color Doppler Indications:    Acute myocardial infarction, unspecified I21.9  History:        Patient has no prior history of Echocardiogram examinations.                 Cardiomyopathy, CAD; Risk Factors:Hypertension and Dyslipidemia.  Sonographer:    Bernadene Person RDCS Referring Phys: [id number] [place]  1. Left ventricular ejection fraction, by estimation, is 50 to 55%. Left ventricular ejection fraction by 2D MOD biplane is 52.6 %. The left ventricle has low normal function. The left ventricle demonstrates regional wall motion abnormalities (see scoring diagram/findings for description). The left ventricular internal cavity size was mildly dilated. Left ventricular diastolic parameters are consistent with Grade I diastolic dysfunction (impaired relaxation). Elevated left ventricular end-diastolic pressure. There is mild hypokinesis of the left ventricular, basal-mid inferior wall and inferolateral wall.  2. Right ventricular systolic function is normal. The right ventricular size is normal.  3. The mitral valve is grossly normal. Trivial mitral valve regurgitation.  4. The aortic valve is calcified. There is moderate calcification of the aortic  valve. Aortic valve regurgitation is not visualized. Severe aortic valve stenosis. Aortic valve area, by VTI measures 0.52 cm. Aortic valve mean gradient measures 48.7 mmHg. Aortic valve Vmax measures 4.29 m/s. DI is 0.16  5. The inferior vena cava is normal in size with greater than 50%  respiratory variability, suggesting right atrial pressure of 3 mmHg. Comparison(s): No prior Echocardiogram. FINDINGS  Left Ventricle: Left ventricular ejection fraction, by estimation, is 50 to 55%. Left ventricular ejection fraction by 2D MOD biplane is 52.6 %. The left ventricle has low normal function. The left ventricle demonstrates regional wall motion abnormalities. Mild hypokinesis of the left ventricular, basal-mid inferior wall and inferolateral wall. The left ventricular internal cavity size was mildly dilated. There is no left ventricular hypertrophy. Left ventricular diastolic parameters are consistent with Grade I diastolic dysfunction (impaired relaxation). Elevated left ventricular end-diastolic pressure.  LV Wall Scoring: The antero-lateral wall and posterior wall are hypokinetic. The inferior wall is normal. Right Ventricle: The right ventricular size is normal. No increase in right ventricular wall thickness. Right ventricular systolic function is normal. Left Atrium: Left atrial size was normal in size. Right Atrium: Right atrial size was normal in size. Pericardium: There is no evidence of pericardial effusion. Mitral Valve: The mitral valve is grossly normal. Trivial mitral valve regurgitation. Tricuspid Valve: The tricuspid valve is grossly normal. Tricuspid valve regurgitation is trivial. Aortic Valve: The aortic valve is calcified. There is moderate calcification of the aortic valve. Aortic valve regurgitation is not visualized. Severe aortic stenosis is present. Aortic valve mean gradient measures 48.7 mmHg. Aortic valve peak gradient measures 73.7 mmHg. Aortic valve area, by VTI measures 0.52 cm. Pulmonic Valve: The pulmonic valve was normal in structure. Pulmonic valve regurgitation is not visualized. Aorta: The aortic root and ascending aorta are structurally normal, with no evidence of dilitation. Venous: The inferior vena cava is normal in size with greater than 50% respiratory  variability, suggesting right atrial pressure of 3 mmHg. IAS/Shunts: No atrial level shunt detected by color flow Doppler.  LEFT VENTRICLE PLAX 2D                        Biplane EF (MOD) LVIDd:         5.90 cm         LV Biplane EF:   Left LVIDs:         4.10 cm                          ventricular LV PW:         1.10 cm                          ejection LV IVS:        1.00 cm                          fraction by LVOT diam:     2.00 cm                          2D MOD LV SV:         62                               biplane is LV SV Index:  28                               52.6 %. LVOT Area:     3.14 cm                                Diastology                                LV e' medial:    5.03 cm/s LV Volumes (MOD)               LV E/e' medial:  16.3 LV vol d, MOD    125.0 ml      LV e' lateral:   7.54 cm/s A2C:                           LV E/e' lateral: 10.8 LV vol d, MOD    121.0 ml A4C: LV vol s, MOD    56.3 ml A2C: LV vol s, MOD    65.9 ml A4C: LV SV MOD A2C:   68.7 ml LV SV MOD A4C:   121.0 ml LV SV MOD BP:    69.3 ml RIGHT VENTRICLE RV S prime:     11.30 cm/s TAPSE (M-mode): 2.2 cm LEFT ATRIUM             Index        RIGHT ATRIUM           Index LA diam:        3.10 cm 1.41 cm/m   RA Area:     13.70 cm LA Vol (A2C):   51.7 ml 23.52 ml/m  RA Volume:   34.40 ml  15.65 ml/m LA Vol (A4C):   47.2 ml 21.47 ml/m LA Biplane Vol: 51.9 ml 23.61 ml/m  AORTIC VALVE AV Area (Vmax):    0.65 cm AV Area (Vmean):   0.55 cm AV Area (VTI):     0.52 cm AV Vmax:           429.33 cm/s AV Vmean:          331.000 cm/s AV VTI:            1.20 m AV Peak Grad:      73.7 mmHg AV Mean Grad:      48.7 mmHg LVOT Vmax:         88.30 cm/s LVOT Vmean:        58.300 cm/s LVOT VTI:          0.198 m LVOT/AV VTI ratio: 0.16  AORTA Ao Root diam: 3.10 cm Ao Asc diam:  3.50 cm MITRAL VALVE MV Area (PHT): 4.89 cm    SHUNTS MV Decel Time: 155 msec    Systemic VTI:  0.20 m MV E velocity: 81.80 cm/s  Systemic Diam: 2.00 cm MV A velocity:  79.70 cm/s MV E/A ratio:  1.03 Lyman Bishop MD Electronically signed by Lyman Bishop MD Signature Date/Time: 04/10/2022/12:08:40 PM    Final    CARDIAC CATHETERIZATION  Result Date: 04/09/2022 1.  Mild obstructive coronary artery disease. 2.  Severely elevated LVEDP of 29 mmHg. 3.  Heavily calcified aortic valve with a mean gradient of 52 mmHg and a peak to peak gradient of 62 mmHg consistent  with severe aortic stenosis; aortic waveforms demonstrated parvus and tardus. 4.  Preserved left ventricular ejection fraction. Recommendation: Echocardiogram to evaluate for severe aortic stenosis which will inform therapy moving forward.   DG Chest Portable 1 View  Result Date: 04/09/2022 CLINICAL DATA:  Chest pain EXAM: PORTABLE CHEST 1 VIEW COMPARISON:  03/29/2021 FINDINGS: The heart size and mediastinal contours are within normal limits. Unchanged small, chronic right pleural effusion and/or pleural thickening with associated bandlike scarring or atelectasis of the right lung base. No acute appearing airspace opacity. The visualized skeletal structures are unremarkable. IMPRESSION: 1. Unchanged small, chronic right pleural effusion and/or pleural thickening with associated bandlike scarring or atelectasis of the right lung base. 2.  No acute appearing airspace opacity. Electronically Signed   By: Delanna Ahmadi M.D.   On: 04/09/2022 15:15    Disposition   Pt is being discharged home today in good condition.  Follow-up Plans & Appointments     Follow-up Information     Eileen Stanford, PA-C. Go on 04/25/2022.   Specialties: Cardiology, Radiology Why: @ 10:30am, please arrive at least 10 minutes early. Contact information: [address]  [place] [address] [phone]                  Discharge Medications   Allergies as of 04/18/2022       Reactions   Tiotropium Bromide Monohydrate Anaphylaxis   spiriva        Medication List     STOP taking these  medications    FISH OIL PO   naproxen 500 MG tablet Commonly known as: NAPROSYN       TAKE these medications    albuterol (2.5 MG/3ML) 0.083% nebulizer solution Commonly known as: PROVENTIL Take 3 mLs (2.5 mg total) by nebulization every 6 (six) hours as needed for wheezing or shortness of breath. What changed: when to take this   albuterol 108 (90 Base) MCG/ACT inhaler Commonly known as: VENTOLIN HFA Inhale 2 puffs into the lungs every 4 (four) hours as needed for wheezing or shortness of breath. What changed: when to take this   aspirin EC 81 MG tablet Take 81 mg by mouth every other day.   aspirin-acetaminophen-caffeine 962-229-79 MG tablet Commonly known as: EXCEDRIN MIGRAINE Take 2 tablets by mouth daily as needed for headache or migraine.   atorvastatin 40 MG tablet Commonly known as: LIPITOR Take 40 mg by mouth daily.   B-12 PO Take 1 capsule by mouth daily.   cholecalciferol 25 MCG (1000 UNIT) tablet Commonly known as: VITAMIN D3 Take 1,000 Units by mouth daily.   ibuprofen 800 MG tablet Commonly known as: ADVIL Take 800 mg by mouth daily as needed for headache.   icosapent Ethyl 1 g capsule Commonly known as: VASCEPA Take 2 capsules (2 g total) by mouth 2 (two) times daily.   lisinopril 10 MG tablet Commonly known as: ZESTRIL Take 10 mg by mouth at bedtime.   metoprolol succinate 25 MG 24 hr tablet Commonly known as: Toprol XL Take 0.5 tablets (12.5 mg total) by mouth daily.   Senna S 8.6-50 MG tablet Generic drug: senna-docusate Take 1-2 tablets by mouth See admin instructions. Take 2 tablets in the morning and 1 tablet at night   traMADol 50 MG tablet Commonly known as: ULTRAM Take 1 tablet (50 mg total) by mouth every 6 (six) hours as needed. What changed:  when to take this reasons to take this   Trelegy Ellipta 100-62.5-25 MCG/ACT  Aepb Generic drug: Fluticasone-Umeclidin-Vilant Inhale 1 puff into the lungs daily.             Outstanding Labs/Studies   none  Duration of Discharge Encounter   Greater than 30 minutes including physician time.  Mable Fill, PA-C 04/18/2022, 10:47 AM [id number]  Patient seen, examined. Available data reviewed. Agree with findings, assessment, and plan as outlined by Nell Range, PA-C. Pt alert, oriented, NAD. Lungs clear. Heart RRR no murmur, bilateral groin sites clear, no edema, abdominal hernia unchanged. Tele reviewed shows NSR with no arrhythmia. Echo shows normal TAVR valve function with mean gradient 12 mmHg and no PVL. Pt medically stable for DC today. Follow-up arranged as above. I agree with the medical therapy outlined above.   Sherren Mocha, M.D. 04/18/2022 1:45 PM

## 2022-04-18 NOTE — Progress Notes (Signed)
CARDIAC REHAB PHASE I   PRE:  Rate/Rhythm: 73 SR  BP:  Sitting: 129/70      SaO2: 97 RA  MODE:  Ambulation: 300 ft   POST:  Rate/Rhythm: 99 SR  BP:  Sitting: 136/77    SaO2: 98 RA   Pt ambulated 399f in hallway independently with steady gait. Pt denies CP, SOB, or dizziness. Pt states breathing feels much better. Reviewed site care, restrictions, and exercise guidelines with pt and wife. Pt declines CRP II.  [id number] Falco RN BSN 04/18/2022 9:48 AM

## 2022-04-20 LAB — POCT I-STAT, CHEM 8
BUN: 20 mg/dL (ref 6–20)
Calcium, Ion: 1.25 mmol/L (ref 1.15–1.40)
Chloride: 106 mmol/L (ref 98–111)
Creatinine, Ser: 1.1 mg/dL (ref 0.61–1.24)
Glucose, Bld: 105 mg/dL — ABNORMAL HIGH (ref 70–99)
HCT: 49 % (ref 39.0–52.0)
Hemoglobin: 16.7 g/dL (ref 13.0–17.0)
Potassium: 4.4 mmol/L (ref 3.5–5.1)
Sodium: 142 mmol/L (ref 135–145)
TCO2: 26 mmol/L (ref 22–32)

## 2022-04-20 LAB — POCT I-STAT 7, (LYTES, BLD GAS, ICA,H+H)
Acid-base deficit: 2 mmol/L (ref 0.0–2.0)
Bicarbonate: 24.7 mmol/L (ref 20.0–28.0)
Calcium, Ion: 1.27 mmol/L (ref 1.15–1.40)
HCT: 48 % (ref 39.0–52.0)
Hemoglobin: 16.3 g/dL (ref 13.0–17.0)
O2 Saturation: 98 %
Potassium: 4.4 mmol/L (ref 3.5–5.1)
Sodium: 141 mmol/L (ref 135–145)
TCO2: 26 mmol/L (ref 22–32)
pCO2 arterial: 46.7 mmHg (ref 32–48)
pH, Arterial: 7.331 — ABNORMAL LOW (ref 7.35–7.45)
pO2, Arterial: 121 mmHg — ABNORMAL HIGH (ref 83–108)

## 2022-04-21 ENCOUNTER — Telehealth: Payer: Self-pay

## 2022-04-21 NOTE — Telephone Encounter (Signed)
Patient contacted regarding discharge from [facility] on 04/18/2022.  Patient understands to follow up with [facility] on 04/25/2022 at 10:30 AM at [facility] location. Patient understands discharge instructions? yes Patient understands medications and regiment? yes Patient understands to bring all medications to this visit? yes

## 2022-04-24 NOTE — Progress Notes (Signed)
[facility] [place]                                     Cardiology Office Note:    Date:  04/25/2022   ID:  Bob Ross, DOB 27-Apr-1966, MRN [id number]  PCP:  Coolidge Breeze, [facility] Cardiologist:  Quay Burow, MD / Dr. Burt Knack & Dr. Cyndia Bent (TAVR) [place] Electrophysiologist:  None   Referring MD: Coolidge Breeze, FNP   [facility] s/p TAVR  History of Present Illness:    Bob Ross is a 56 y.o. 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   He was recently admitted to [facility] for acute chest pain and found to have a NSTEMI (pk troponin 1,146). LHC on 8/16 showed mild non-obstructive CAD but severe aortic stenosis and severely elevated LVEDP. Echo showed LVEF of 50-55% with hypokinesis of the anterolateral wall and posterior wall as well as severe AS. He was evaluated by the structural heart team and underwent successful TAVR with a 26 mm Edwards S3UR via the TF approach on 04/17/22 by Dr. Burt Knack and Dr. Cyndia Bent. Post op echo showed EF 60-65%, normally functioning TAVR with a mean gradient of 12.2 mmHg and no PVL. He was discharged on a baby aspirin.   Today the patient presents to clinic for follow up. Here with his wife. No CP or SOB. No LE edema, orthopnea or PND. No dizziness or syncope. No blood in stool or urine. No palpitations. Can do more without getting out of breath now.      Past Medical History:  Diagnosis Date   CAD (coronary artery disease)    Nonobstructive by cardiac catheterization 2021 [facility] cardiology   Cardiomyopathy [facility])    Colon  cancer [facility])    Metastatic to liver and lymph nodes status post surgery and chemotherapy 2005   Erectile dysfunction    Essential hypertension    History of pneumonia    Incisional hernia    Mixed hyperlipidemia    S/P TAVR (transcatheter aortic valve replacement) 04/17/2022   s/p TAVR wtih a 26 mm Olivia Mackie via the TF approach by Dr. Burt Knack & Dr. Cyndia Bent   Severe aortic stenosis     Past Surgical History:  Procedure Laterality Date   COLON SURGERY     COLOSTOMY     COLOSTOMY REVERSAL     HERNIA REPAIR     INTRAOPERATIVE TRANSTHORACIC ECHOCARDIOGRAM N/A 04/17/2022   Procedure: INTRAOPERATIVE TRANSTHORACIC ECHOCARDIOGRAM;  Surgeon: Sherren Mocha, MD;  Location: [place];  Service: Open Heart Surgery;  Laterality: N/A;   LEFT HEART CATH AND CORONARY ANGIOGRAPHY N/A 04/09/2022   Procedure: LEFT HEART CATH AND CORONARY ANGIOGRAPHY;  Surgeon: Early Osmond, MD;  Location: [place] CV LAB;  Service: Cardiovascular;  Laterality: N/A;   LIVER RESECTION     TRANSCATHETER AORTIC VALVE REPLACEMENT, TRANSFEMORAL N/A 04/17/2022   Procedure: Transcatheter Aortic Valve Replacement, Transfemoral;  Surgeon: Sherren Mocha, MD;  Location: [place];  Service: Open Heart Surgery;  Laterality: N/A;  Current Medications: Current Meds  Medication Sig   albuterol (VENTOLIN HFA) 108 (90 Base) MCG/ACT inhaler Inhale 2 puffs into the lungs every 4 (four) hours as needed for wheezing or shortness of breath. (Patient taking differently: Inhale 2 puffs into the lungs daily as needed for wheezing or shortness of breath.)   aspirin EC 81 MG tablet Take 81 mg by mouth daily.   aspirin-acetaminophen-caffeine (EXCEDRIN MIGRAINE) 250-250-65 MG tablet Take 2 tablets by mouth daily as needed for headache or migraine.   cholecalciferol (VITAMIN D3) 25 MCG (1000 UNIT) tablet Take 1,000 Units by mouth daily.   Cyanocobalamin (B-12 PO) Take 1 capsule by mouth daily.   Fluticasone-Umeclidin-Vilant (TRELEGY ELLIPTA)  100-62.5-25 MCG/ACT AEPB Inhale 1 puff into the lungs daily.   ibuprofen (ADVIL) 800 MG tablet Take 800 mg by mouth daily as needed for headache.   icosapent Ethyl (VASCEPA) 1 g capsule Take 2 capsules (2 g total) by mouth 2 (two) times daily.   metoprolol succinate (TOPROL XL) 25 MG 24 hr tablet Take 0.5 tablets (12.5 mg total) by mouth daily.   senna-docusate (SENNA S) 8.6-50 MG tablet Take 1-2 tablets by mouth See admin instructions. Take 2 tablets in the morning and 1 tablet at night   traMADol (ULTRAM) 50 MG tablet Take 1 tablet (50 mg total) by mouth every 6 (six) hours as needed. (Patient taking differently: Take 50 mg by mouth daily as needed for moderate pain.)   [DISCONTINUED] amoxicillin (AMOXIL) 500 MG tablet Take 4 tablets by mouth 1 hour before dental procedures and cleanings   [DISCONTINUED] atorvastatin (LIPITOR) 40 MG tablet Take 40 mg by mouth daily.     Allergies:   Tiotropium bromide monohydrate   Social History   Socioeconomic History   Marital status: Married    Spouse name: Not on file   Number of children: Not on file   Years of education: Not on file   Highest education level: Not on file  Occupational History   Not on file  Tobacco Use   Smoking status: Every Day    Packs/day: 2.00    Types: Cigarettes   Smokeless tobacco: Never  Substance and Sexual Activity   Alcohol use: Yes    Comment: Occasional   Drug use: No   Sexual activity: Not on file  Other Topics Concern   Not on file  Social History Narrative   Not on file   Social Determinants of Health   Financial Resource Strain: Not on file  Food Insecurity: Not on file  Transportation Needs: Not on file  Physical Activity: Not on file  Stress: Not on file  Social Connections: Not on file     Family History: The patient's family history includes AAA (abdominal aortic aneurysm) in his mother; Cancer in his father.  ROS:   Please see the history of present illness.    All other systems  reviewed and are negative.  EKGs/Labs/Other Studies Reviewed:    The following studies were reviewed today:  TAVR OPERATIVE NOTE     Date of Procedure:                04/17/2022   Preoperative Diagnosis:      Severe Aortic Stenosis    Postoperative Diagnosis:    Same    Procedure:        Transcatheter Aortic Valve Replacement - Percutaneous  Transfemoral Approach             Edwards Sapien 3 Ultra Resilia THV (  size 26 mm, serial # [id number])              Co-Surgeons:                        Gaye Pollack, MD and Sherren Mocha, MD   Anesthesiologist:                  Suella Broad, MD   Echocardiographer:              Sanda Klein, MD   Pre-operative Echo Findings: Severe aortic stenosis Normal left ventricular systolic function   Post-operative Echo Findings: No paravalvular leak Normal/unchanged left ventricular systolic function   _____________    Echo 04/18/22:  IMPRESSIONS  1. Left ventricular ejection fraction, by estimation, is 60 to 65%. The  left ventricle has normal function. The left ventricle has no regional  wall motion abnormalities. There is mild concentric left ventricular  hypertrophy. Left ventricular diastolic  parameters are consistent with Grade II diastolic dysfunction  (pseudonormalization). Elevated left atrial pressure.   2. Right ventricular systolic function is normal. The right ventricular  size is normal.   3. The mitral valve is normal in structure. No evidence of mitral valve  regurgitation. No evidence of mitral stenosis.   4. The aortic valve has been repaired/replaced. Aortic valve  regurgitation is not visualized. No aortic stenosis is present. There is a  26 mm Sapien prosthetic (TAVR) valve present in the aortic position.  Procedure Date: 04/17/2022. Aortic valve mean  gradient measures 12.2 mmHg. Aortic valve Vmax measures 2.39 m/s. Aortic  valve acceleration time measures 85 msec.   5. The inferior vena cava is normal in  size with greater than 50%  respiratory variability, suggesting right atrial pressure of 3 mmHg.    EKG:  EKG is ordered today.  The ekg ordered today demonstrates sinus with PACs HR 68  Recent Labs: 04/18/2022: BUN 22; Creatinine, Ser 1.22; Hemoglobin 15.8; Magnesium 1.9; Platelets 86; Potassium 4.4; Sodium 137  Recent Lipid Panel    Component Value Date/Time   CHOL 126 04/10/2022 0142   TRIG 309 (H) 04/10/2022 0142   HDL 36 (L) 04/10/2022 0142   CHOLHDL 3.5 04/10/2022 0142   VLDL 62 (H) 04/10/2022 0142   LDLCALC 28 04/10/2022 0142     Risk Assessment/Calculations:       Physical Exam:    VS:  BP 128/70   Pulse 68   Ht '5\' 11"'$  (1.803 m)   Wt 218 lb 6.4 oz (99.1 kg)   SpO2 98%   BMI 30.46 kg/m     Wt Readings from Last 3 Encounters:  04/25/22 218 lb 6.4 oz (99.1 kg)  04/18/22 219 lb 2.2 oz (99.4 kg)  03/25/22 223 lb 2 oz (101.2 kg)     GEN:  Well nourished, well developed in no acute distress HEENT: Normal NECK: No JVD LYMPHATICS: No lymphadenopathy CARDIAC: RRR, no murmurs, rubs, gallops RESPIRATORY:  Clear to auscultation without rales, wheezing or rhonchi  ABDOMEN: Soft, non-tender, non-distended MUSCULOSKELETAL:  No edema; No deformity  SKIN: Warm and dry.  Groin sites clear without hematoma or ecchymosis  NEUROLOGIC:  Alert and oriented x 3 PSYCHIATRIC:  Normal affect   ASSESSMENT:    1. S/P TAVR (transcatheter aortic valve replacement)   2. Poor dentition   3. NICM (nonischemic cardiomyopathy) ([place])   4. Essential hypertension   5. Hyperlipidemia, unspecified hyperlipidemia type   6. Tobacco abuse  7. Abdominal aortic aneurysm (AAA) without rupture, unspecified part ([place])    PLAN:    In order of problems listed above:  Severe AS s/p TAVR: doing great 1 week out from TAVR. ECG with no HAVB. Groin sites healing well. SBE prophylaxis discussed; I have RX'd amoxicillin. Continue aspirin 81 mg daily ( previously taking QOD). I will see him back for 1  month follow up   Poor dentition: seen by Dr. Benson Norway and needs a near full mouth extraction. Will make sure he has follow up. 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 He has no s/s of CHF. Continue on Toprol XL 12.'5mg'$  daily.    HTN: Bp well controlled on Toprol XL 12.'5mg'$  daily. He has not been taking Lisinopril '10mg'$  daily since discharge and wonders if he needs it. I told him he can stay off it for now since heart function normal and BP well controlled.    HLD: continue Atorvastatin '40mg'$  daily and Vascepa 2g twice daily.   COPD/Tobacco Abuse: PFTs with severe COPD. Counseled on cessation. He has cut back. Will send in 21 mg Nictoine patches   AAA: pre TAVR CTs showed a 3.1 cm infrarenal abdominal aortic aneurysm. Recommended [place] q3 years. This will be followed over time.    Medication Adjustments/Labs and Tests Ordered: Current medicines are reviewed at length with the patient today.  Concerns regarding medicines are outlined above.  Orders Placed This Encounter  Procedures   EKG 12-Lead   Meds ordered this encounter  Medications   DISCONTD: amoxicillin (AMOXIL) 500 MG tablet    Sig: Take 4 tablets by mouth 1 hour before dental procedures and cleanings    Dispense:  12 tablet    Refill:  6   amoxicillin (AMOXIL) 500 MG tablet    Sig: Take 4 tablets by mouth 1 hour before dental procedures and cleanings    Dispense:  12 tablet    Refill:  6   atorvastatin (LIPITOR) 40 MG tablet    Sig: Take 1 tablet (40 mg total) by mouth daily.    Dispense:  90 tablet    Refill:  3    Patient Instructions  Medication Instructions: Stop Lisinopril  Start taking Aspirin 81 mg every day  Restart Atorvastatin 40 mg daily  Start Amoxicillin 500 mg, take 4 tablets by mouth 1 hour before dental procedures and cleanings  Start Nicotine patch, use one patch every 24 hours   *If you need a refill on your  cardiac medications before your next appointment, please call your pharmacy*   Lab Work: None ordered   If you have labs (blood work) drawn today and your tests are completely normal, you will receive your results only by: MyChart Message (if you have MyChart) OR A paper copy in the mail If you have any lab test that is abnormal or we need to change your treatment, we will call you to review the results.   Testing/Procedures: None ordered today   Follow-Up: Follow up as scheduled   Other Instructions   Important Information About Sugar         Signed, Angelena Form, PA-C  04/25/2022 11:20 AM    [place]

## 2022-04-25 ENCOUNTER — Other Ambulatory Visit: Payer: Self-pay | Admitting: Physician Assistant

## 2022-04-25 ENCOUNTER — Ambulatory Visit: Payer: BC Managed Care – PPO | Attending: Physician Assistant | Admitting: Physician Assistant

## 2022-04-25 VITALS — BP 128/70 | HR 68 | Ht 71.0 in | Wt 218.4 lb

## 2022-04-25 DIAGNOSIS — Z952 Presence of prosthetic heart valve: Secondary | ICD-10-CM | POA: Diagnosis not present

## 2022-04-25 DIAGNOSIS — E785 Hyperlipidemia, unspecified: Secondary | ICD-10-CM

## 2022-04-25 DIAGNOSIS — I428 Other cardiomyopathies: Secondary | ICD-10-CM | POA: Diagnosis not present

## 2022-04-25 DIAGNOSIS — K089 Disorder of teeth and supporting structures, unspecified: Secondary | ICD-10-CM | POA: Diagnosis not present

## 2022-04-25 DIAGNOSIS — I10 Essential (primary) hypertension: Secondary | ICD-10-CM

## 2022-04-25 DIAGNOSIS — I714 Abdominal aortic aneurysm, without rupture, unspecified: Secondary | ICD-10-CM

## 2022-04-25 DIAGNOSIS — Z720 Tobacco use: Secondary | ICD-10-CM

## 2022-04-25 MED ORDER — ATORVASTATIN CALCIUM 40 MG PO TABS: 40.0000 mg | ORAL_TABLET | Freq: Every day | ORAL | 3 refills | Status: DC

## 2022-04-25 MED ORDER — AMOXICILLIN 500 MG PO TABS: ORAL_TABLET | ORAL | 6 refills | Status: DC

## 2022-04-25 MED ORDER — NICOTINE 21 MG/24HR TD PT24: 21.0000 mg | MEDICATED_PATCH | TRANSDERMAL | 6 refills | Status: DC

## 2022-04-25 NOTE — Patient Instructions (Addendum)
Medication Instructions: Stop Lisinopril  Start taking Aspirin 81 mg every day  Restart Atorvastatin 40 mg daily  Start Amoxicillin 500 mg, take 4 tablets by mouth 1 hour before dental procedures and cleanings  Start Nicotine patch, use one patch every 24 hours   *If you need a refill on your cardiac medications before your next appointment, please call your pharmacy*   Lab Work: None ordered   If you have labs (blood work) drawn today and your tests are completely normal, you will receive your results only by: [place] (if you have MyChart) OR A paper copy in the mail If you have any lab test that is abnormal or we need to change your treatment, we will call you to review the results.   Testing/Procedures: None ordered today   Follow-Up: Follow up as scheduled   Other Instructions   Important Information About Sugar

## 2022-05-06 ENCOUNTER — Ambulatory Visit: Payer: BC Managed Care – PPO | Admitting: Orthopaedic Surgery

## 2022-05-06 DIAGNOSIS — C189 Malignant neoplasm of colon, unspecified: Secondary | ICD-10-CM | POA: Diagnosis not present

## 2022-05-06 DIAGNOSIS — D751 Secondary polycythemia: Secondary | ICD-10-CM | POA: Diagnosis not present

## 2022-05-06 DIAGNOSIS — Z6830 Body mass index (BMI) 30.0-30.9, adult: Secondary | ICD-10-CM | POA: Diagnosis not present

## 2022-05-06 DIAGNOSIS — Z125 Encounter for screening for malignant neoplasm of prostate: Secondary | ICD-10-CM | POA: Diagnosis not present

## 2022-05-06 DIAGNOSIS — I2510 Atherosclerotic heart disease of native coronary artery without angina pectoris: Secondary | ICD-10-CM | POA: Diagnosis not present

## 2022-05-06 DIAGNOSIS — E785 Hyperlipidemia, unspecified: Secondary | ICD-10-CM | POA: Diagnosis not present

## 2022-05-15 ENCOUNTER — Telehealth (HOSPITAL_COMMUNITY): Payer: Self-pay

## 2022-05-19 ENCOUNTER — Encounter (HOSPITAL_COMMUNITY): Payer: Self-pay | Admitting: Dentistry

## 2022-05-19 ENCOUNTER — Ambulatory Visit (INDEPENDENT_AMBULATORY_CARE_PROVIDER_SITE_OTHER): Payer: BC Managed Care – PPO | Admitting: Dentistry

## 2022-05-19 VITALS — BP 115/72 | HR 62 | Temp 98.3°F

## 2022-05-19 DIAGNOSIS — Z952 Presence of prosthetic heart valve: Secondary | ICD-10-CM

## 2022-05-19 DIAGNOSIS — K117 Disturbances of salivary secretion: Secondary | ICD-10-CM

## 2022-05-19 DIAGNOSIS — Z0120 Encounter for dental examination and cleaning without abnormal findings: Secondary | ICD-10-CM

## 2022-05-19 DIAGNOSIS — K045 Chronic apical periodontitis: Secondary | ICD-10-CM

## 2022-05-19 DIAGNOSIS — K0530 Chronic periodontitis, unspecified: Secondary | ICD-10-CM

## 2022-05-19 DIAGNOSIS — F40232 Fear of other medical care: Secondary | ICD-10-CM

## 2022-05-19 DIAGNOSIS — K036 Deposits [accretions] on teeth: Secondary | ICD-10-CM

## 2022-05-19 DIAGNOSIS — K0889 Other specified disorders of teeth and supporting structures: Secondary | ICD-10-CM

## 2022-05-19 DIAGNOSIS — K08109 Complete loss of teeth, unspecified cause, unspecified class: Secondary | ICD-10-CM

## 2022-05-19 DIAGNOSIS — M264 Malocclusion, unspecified: Secondary | ICD-10-CM

## 2022-05-19 DIAGNOSIS — K029 Dental caries, unspecified: Secondary | ICD-10-CM

## 2022-05-19 NOTE — Progress Notes (Signed)
[place] [facility] of Dental Medicine     OUTPATIENT CONSULTATION   PLAN/RECOMMENDATIONS     ASSESSMENT: There are no current signs of acute odontogenic infection including abscess, edema or erythema, or suspicious lesion requiring biopsy.   Severe caries, chronic apical periodontitis; periodontal concerns that include loose teeth and bone loss.     RECOMMENDATIONS: Extractions of all indicated teeth to decrease the risk of perioperative and postoperative systemic infection and complications.       PLAN: Discuss case with medical team and coordinate treatment as needed. Tentatively scheduled O.R. date(s) for Thurs, Oct. 26th or Thurs, Nov 9th.  We will need to make sure that the patient has an updated H&P within 30 days of O.R. date.     Discussed in detail all treatment options and recommendations with the patient and they are agreeable to the plan.   Thank you for consulting with [facility] and for the opportunity to participate in this patient's treatment.  Should you have any questions or concerns, please contact the [facility] at [phone].    Service Date:   05/19/2022  Patient Name:   Bob Ross Date of Birth:   Jan 06, 1966 Medical Record Number: [id number]  Referring Provider:                  Gilford Raid, MD   HISTORY OF PRESENT ILLNESS: Bob Ross is a pleasant 56 y.o. male with history of HTN, hyperlipidemia, nonobstructive coronary artery disease, metastatic colon cancer in 2005 (treated with radical surgery and chemotherapy), cardiomyopathy (2021), heavy tobacco use (cigarettes- since teenager; 1/2-1 ppd) and severe aortic stenosis status post TAVR in August 2023. The patient presents today for a medically necessary dental consultation as part of the patient's cardiac surgery work-up.  He was seen previously on 04/14/22 as an inpatient for a preoperative dental consult, however his TAVR was urgent and dental extractions were  delayed until after his surgery and medical optimization.   DENTAL HISTORY: The patient reports that he has not been to a dentist in many years.   He last went to the dentist for emergency tooth extraction years ago (does not remember exactly when).  He currently denies any dental/orofacial pain or sensitivity. Patient is able to manage oral secretions.  Patient denies dysphagia, odynophagia, dysphonia.  Patient denies fever, rigors and malaise.   CHIEF COMPLAINT:  Here for a (now-postoperative) dental evaluation.   Patient Active Problem List   Diagnosis Date Noted   S/P TAVR (transcatheter aortic valve replacement) 04/17/2022   Severe aortic stenosis 04/15/2022   Caries    Accretions on teeth    Chronic periodontitis    Non-ST elevation (NSTEMI) myocardial infarction ([place]) 04/09/2022   Otitis externa 04/08/2022   Tobacco abuse 04/08/2022   Coronary artery disease 01/19/2020   Rupture of left distal biceps tendon 08/13/2018   Rectus diastasis 04/25/2015   Chest pain 03/29/2014   Thrombocytopenia ([place]) 03/29/2014   Past Medical History:  Diagnosis Date   CAD (coronary artery disease)    Nonobstructive by cardiac catheterization 2021 [facility] cardiology   Cardiomyopathy [facility])    Colon cancer ([place])    Metastatic to liver and lymph nodes status post surgery and chemotherapy 2005   Erectile dysfunction    Essential hypertension    History of pneumonia    Incisional hernia    Mixed hyperlipidemia    S/P TAVR (transcatheter aortic valve replacement) 04/17/2022   s/p TAVR wtih a 26 mm Olivia Mackie  via the TF approach by Dr. Burt Knack & Dr. Cyndia Bent   Severe aortic stenosis    Past Surgical History:  Procedure Laterality Date   COLON SURGERY     COLOSTOMY     COLOSTOMY REVERSAL     HERNIA REPAIR     INTRAOPERATIVE TRANSTHORACIC ECHOCARDIOGRAM N/A 04/17/2022   Procedure: INTRAOPERATIVE TRANSTHORACIC ECHOCARDIOGRAM;  Surgeon: Sherren Mocha, MD;  Location: [place];  Service: Open Heart  Surgery;  Laterality: N/A;   LEFT HEART CATH AND CORONARY ANGIOGRAPHY N/A 04/09/2022   Procedure: LEFT HEART CATH AND CORONARY ANGIOGRAPHY;  Surgeon: Early Osmond, MD;  Location: [place] CV [facility];  Service: Cardiovascular;  Laterality: N/A;   LIVER RESECTION     TRANSCATHETER AORTIC VALVE REPLACEMENT, TRANSFEMORAL N/A 04/17/2022   Procedure: Transcatheter Aortic Valve Replacement, Transfemoral;  Surgeon: Sherren Mocha, MD;  Location: [place];  Service: Open Heart Surgery;  Laterality: N/A;   Allergies  Allergen Reactions   Tiotropium Bromide Monohydrate Anaphylaxis    spiriva   Current Outpatient Medications  Medication Sig Dispense Refill   albuterol (PROVENTIL) (2.5 MG/3ML) 0.083% nebulizer solution Take 3 mLs (2.5 mg total) by nebulization every 6 (six) hours as needed for wheezing or shortness of breath. (Patient not taking: Reported on 04/25/2022) 75 mL 0   albuterol (VENTOLIN HFA) 108 (90 Base) MCG/ACT inhaler Inhale 2 puffs into the lungs every 4 (four) hours as needed for wheezing or shortness of breath. (Patient taking differently: Inhale 2 puffs into the lungs daily as needed for wheezing or shortness of breath.) 1 each 3   amoxicillin (AMOXIL) 500 MG tablet Take 4 tablets by mouth 1 hour before dental procedures and cleanings 12 tablet 6   aspirin EC 81 MG tablet Take 81 mg by mouth daily.     aspirin-acetaminophen-caffeine (EXCEDRIN MIGRAINE) 250-250-65 MG tablet Take 2 tablets by mouth daily as needed for headache or migraine.     atorvastatin (LIPITOR) 40 MG tablet Take 1 tablet (40 mg total) by mouth daily. 90 tablet 3   cholecalciferol (VITAMIN D3) 25 MCG (1000 UNIT) tablet Take 1,000 Units by mouth daily.     Cyanocobalamin (B-12 PO) Take 1 capsule by mouth daily.     Fluticasone-Umeclidin-Vilant (TRELEGY ELLIPTA) 100-62.5-25 MCG/ACT AEPB Inhale 1 puff into the lungs daily.     ibuprofen (ADVIL) 800 MG tablet Take 800 mg by mouth daily as needed for headache.     icosapent  Ethyl (VASCEPA) 1 g capsule Take 2 capsules (2 g total) by mouth 2 (two) times daily. 120 capsule 3   metoprolol succinate (TOPROL XL) 25 MG 24 hr tablet Take 0.5 tablets (12.5 mg total) by mouth daily. 30 tablet 6   nicotine (NICODERM CQ - DOSED IN MG/24 HOURS) 21 mg/24hr patch Place 1 patch (21 mg total) onto the skin daily. 30 patch 6   senna-docusate (SENNA S) 8.6-50 MG tablet Take 1-2 tablets by mouth See admin instructions. Take 2 tablets in the morning and 1 tablet at night     traMADol (ULTRAM) 50 MG tablet Take 1 tablet (50 mg total) by mouth every 6 (six) hours as needed. (Patient taking differently: Take 50 mg by mouth daily as needed for moderate pain.) 15 tablet 0   No current facility-administered medications for this visit.    LABS:  Lab Results  Component Value Date   WBC 11.3 (H) 04/18/2022   HGB 15.8 04/18/2022   HCT 48.4 04/18/2022   MCV 94.0 04/18/2022   PLT 86 (L)  04/18/2022      Component Value Date/Time   NA 137 04/18/2022 0327   K 4.4 04/18/2022 0327   CL 108 04/18/2022 0327   CO2 18 (L) 04/18/2022 0327   GLUCOSE 104 (H) 04/18/2022 0327   BUN 22 (H) 04/18/2022 0327   CREATININE 1.22 04/18/2022 0327   CALCIUM 8.9 04/18/2022 0327   GFRNONAA >60 04/18/2022 0327   GFRAA >60 10/09/2017 1022   Lab Results  Component Value Date   INR 0.9 04/16/2022   No results found for: "PTT"  Social History   Socioeconomic History   Marital status: Married    Spouse name: Not on file   Number of children: Not on file   Years of education: Not on file   Highest education level: Not on file  Occupational History   Not on file  Tobacco Use   Smoking status: Every Day    Packs/day: 2.00    Types: Cigarettes   Smokeless tobacco: Never  Substance and Sexual Activity   Alcohol use: Yes    Comment: Occasional   Drug use: No   Sexual activity: Not on file  Other Topics Concern   Not on file  Social History Narrative   Not on file   Social Determinants of  Health   Financial Resource Strain: Not on file  Food Insecurity: Not on file  Transportation Needs: Not on file  Physical Activity: Not on file  Stress: Not on file  Social Connections: Not on file  Intimate Partner Violence: Not on file   Family History  Problem Relation Age of Onset   AAA (abdominal aortic aneurysm) Mother    Cancer Father      REVIEW OF SYSTEMS:  Reviewed with the patient as per HPI. PSYCH:  [+] Dental phobia   VITAL SIGNS: BP 115/72 (BP Location: Right Arm, Patient Position: Sitting, Cuff Size: Normal)   Pulse 62   Temp 98.3 F (36.8 C) (Oral)    PHYSICAL EXAM: GENERAL:  Well-developed, comfortable and in no apparent distress. NEUROLOGICAL:  Alert and oriented to person, place and  time. EXTRAORAL:  Facial symmetry present without any edema or erythema.  No swelling or lymphadenopathy.  TMJ asymptomatic without clicks or crepitations.  INTRAORAL:  Soft tissues appear well-perfused.  FOM and vestibules soft and not raised. Oral cavity without mass or lesion. No signs of infection, parulis, sinus tract, edema or erythema evident upon exam.  [+] Mucous membranes:  Thick, viscous saliva    DENTAL EXAM: Hard tissue exam completed and charted.     OVERALL IMPRESSION:  Poor remaining dentition.       ORAL HYGIENE:  Poor     PERIODONTAL:  Pink, healthy gingival tissue with blunted papilla with areas of inflamed and erythematous tissue.   Generalized plaque and calculus accumulation. [+] Mobility:  Class II-  #24, #25, #26 [+] Recession:  Generalized CARIES:  Severe decay in all 4 quadrants DEFECTIVE RESTORATIONS:  #14, #18 and #31 have existing amalgam restorations with recurrent decay PROSTHODONTICS:  Patient denies any history of wearing partial dentures. OCCLUSION:  Class 2 malocclusion of anterior teeth when opposed Non-functional teeth:  #14, #18, #20, #21, #31 OTHER FINDINGS:   [+] Attrition/wear:  #7-#10 incisal; #22-#27  incisal   RADIOGRAPHIC EXAM:  Full Mouth Series was exposed and interpreted.   Generalized moderate horizontal bone loss with areas of localized severe consistent with moderate to severe periodontitis.  Radiographic calculus evident.  #14, #18 & #31 are drifting mesial. Missing  teeth #'s 1, 2, 3, 4, 12, 13, 15, 16, 17, 19, 29, 30 & 32. Existing restorations on #14, #18 & #31.  Periapical radiolucencies noted on teeth #'s 5, 6, 23, 24, 25 & 26.  Caries- #5MD, #6MD, #7MD, #8MD, #9MD, #10 all surfaces, #11MD, #14MD, #18MD, #76M, #21D, #22D, #68M, #31MD.  #7, #9, #10, #11, #14, #18 & #31 have deep decay approximating the pulp.   ASSESSMENT:  1.  Status post TAVR 2.  Postoperative dental exam 3.  Requires antibiotic prophylaxis 4.  Caries 5.  Chronic apical periodontitis 6.  Accretions on teeth 7.  Loose teeth 8.  Chronic periodontitis 9.  Clinical xerostomia 10.  Missing teeth 11.  Dental Phobia 12.  Malocclusion   PLAN AND RECOMMENDATIONS: I discussed the risks, benefits, and complications of various scenarios with the patient in relationship to their medical and dental conditions, which included systemic infection such as endocarditis, bacteremia or other serious issues that could potentially occur if dental/oral concerns are not addressed.  I explained that if any chronic or acute dental/oral infection(s) are addressed and subsequently not maintained following medical optimization and recovery, their risk of the previously mentioned complications are just as high and could potentially occur postoperatively.  I explained all significant findings of the dental consultation with the patient including severe decay on multiple teeth with chronic periapical infection, loose teeth due to bone loss and gum disease/chronic inflammation and other teeth with cavities, although not severe, could become severe if not addressed, and the recommended care including extractions of all hopeless teeth with  severe decay and all teeth with a poor periodontal prognosis (loose teeth, teeth with severe bone loss) in order to optimize them from a dental standpoint.  The patient verbalized understanding of all findings, discussion, and recommendations. We then discussed various treatment options to include no treatment, multiple extractions with alveoloplasty, pre-prosthetic surgery as indicated, periodontal therapy, dental restorations, root canal therapy, crown and bridge therapy, implant therapy, and replacement of missing teeth as indicated.  I discussed recommended extractions to include all remaining maxillary teeth and lower teeth numbers 18, 23, 24, 25, 26 and 31 with alveoloplasty as needed.  I discussed the benefits of saving teeth numbers 20, 21, 22, 27 and 28 to have as abutment teeth or anchors for a lower partial denture if the patient is willing to return to the dentist regularly for cleanings and exams and other indicated treatment as well as improve at-home oral hygiene.  The patient verbalized understanding of all options, and currently wishes to proceed with the recommended treatment option of saving 5 teeth on the bottom and extracting all other teeth.  Plan to discuss all findings and recommendations with medical team and coordinate future care as needed.  We discussed potential O.R. dates for Thursday, October 26th or Thursday, November 9th.  Will need to verify that the patient will have an updated H&P within 30 days of either date prior to scheduling. The patient will need to establish care at a dental office of his choice for routine dental care including replacement of missing teeth as needed, cleanings and exams.  All questions and concerns were invited and addressed.  The patient tolerated today's visit well and departed in stable condition.  I spent in excess of 120 minutes during the conduct of this consultation and >50% of this time involved direct face-to-face encounter for counseling  and/or coordination of the patient's care.  Sandi Mariscal, DMD

## 2022-05-19 NOTE — Patient Instructions (Signed)
[facility] DEPARTMENT OF DENTAL MEDICINE Dr. Gurvir Schrom B. Khamarion Bjelland, DMD Phone: [phone] Fax: [phone]       It was a pleasure seeing you today!  Please refer to the information below regarding your dental visit with us.  Please do not hesitate to give us a call if any questions or concerns come up after you leave.    Thank you for letting us provide care for you.  If there is anything we can do for you, please let us know.    HEART VALVES AND MOUTH CARE  FACTS: If you have any infection in your mouth, it can infect your heart valve. If you heart valve is infected, you will be seriously ill. Infections in the mouth can be SILENT and do not always cause pain. Examples of infections in the mouth are gum disease, dental cavities and abscesses. Some possible signs of infection are:  Bad breath, bleeding gums, or teeth that are sensitive to sweets, hot, and/or cold. There are many other signs as well.     WHAT YOU HAVE TO DO: Brush your teeth after meals and at bedtime.  Spend at least 2 minutes brushing well, especially behind your back teeth and all around your teeth that stand alone.  Brush at the gumline also. Do not go to bed without brushing your teeth and flossing.  If your gums bleed when you brush or floss, do NOT stop brushing or flossing.  Bleeding can be a sign of inflammation or irritation from bacteria.  It usually means that your gums need more attention and better cleaning.  If your dentist or Dr. Mandolin Falwell gave you a prescription mouthwash to use, make sure to use it as directed. If you run out of the medication, notify your pharmacy. If you were given any other medications or directions by your dentist, please follow them.  If you did not understand the directions or forget what you were told, please call.  We will be happy to refresh your memory. If you need antibiotics before dental procedures, make sure you take them one hour prior to  every dental visit as directed.  Get a dental check-up every 4-6 months in order to keep your mouth healthy, or to find and treat any new infection. You will most likely need your teeth cleaned or gums treated at the same time. If you are not able to come in for your scheduled appointment, call your dentist as soon as possible to reschedule. If you have a problem in between dental visits, call your dentist.    WE ARE A TEAM.  OUR GOAL IS: HEALTHY MOUTH, HEALTHY HEART    Questions?  Call our office during office hours at [phone].   

## 2022-05-20 ENCOUNTER — Other Ambulatory Visit: Payer: Self-pay | Admitting: Physician Assistant

## 2022-05-20 DIAGNOSIS — Z952 Presence of prosthetic heart valve: Secondary | ICD-10-CM

## 2022-05-20 NOTE — Progress Notes (Unsigned)
HEART AND Secretary                                     Cardiology Office Note:    Date:  05/22/2022   ID:  Bob Ross, DOB [birth year], MRN [id number]  PCP:  Bob Ross, [facility] Cardiologist:  Sherren Mocha, MD / Dr. Burt Knack & Dr. Cyndia Bent (TAVR) [place] Electrophysiologist:  None   Referring MD: Bob Breeze, FNP   1 month s/p TAVR  History of Present Illness:    Bob Ross is a 56 y.o. 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   He was recently admitted to [facility] for acute chest pain and found to have a NSTEMI (pk troponin 1,146). LHC on 8/16 showed mild non-obstructive CAD but severe aortic stenosis and severely elevated LVEDP. Echo showed LVEF of 50-55% with hypokinesis of the anterolateral wall and posterior wall as well as severe AS. He was evaluated by the structural heart team and underwent successful TAVR with a 26 mm Edwards S3UR via the TF approach on 04/17/22 by Dr. Burt Knack and Dr. Cyndia Bent. Post op echo showed EF 60-65%, normally functioning TAVR with a mean gradient of 12.2 mmHg and no PVL. He was discharged on a baby aspirin.   He has done quite well in follow up with a symptomatic improvement. Met with Dr. Benson Norway on 9/25 to discuss dental extractions.   Today the patient presents to clinic for follow up. Here with his wife. No CP or SOB. No LE edema, orthopnea or PND. No dizziness or syncope. No blood in stool or urine. No palpitations.     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 Burt Knack & Dr. Cyndia Bent   Severe aortic stenosis     Past Surgical History:  Procedure Laterality Date   COLON SURGERY     COLOSTOMY     COLOSTOMY REVERSAL     HERNIA REPAIR     INTRAOPERATIVE TRANSTHORACIC ECHOCARDIOGRAM N/A 04/17/2022   Procedure: INTRAOPERATIVE TRANSTHORACIC ECHOCARDIOGRAM;  Surgeon: Sherren Mocha, MD;  Location: [place];  Service: Open Heart Surgery;  Laterality: N/A;   LEFT HEART CATH AND CORONARY ANGIOGRAPHY N/A 04/09/2022   Procedure: LEFT HEART CATH AND CORONARY ANGIOGRAPHY;  Surgeon: Early Osmond, MD;  Location: [place] CV LAB;  Service: Cardiovascular;  Laterality: N/A;   LIVER RESECTION     TRANSCATHETER AORTIC VALVE REPLACEMENT, TRANSFEMORAL N/A 04/17/2022   Procedure: Transcatheter Aortic Valve Replacement, Transfemoral;  Surgeon: Burt Knack,  Legrand Como, MD;  Location: [place];  Service: Open Heart Surgery;  Laterality: N/A;    Current Medications: Current Meds  Medication Sig   amoxicillin (AMOXIL) 500 MG tablet Take 4 tablets by mouth 1 hour before dental procedures and cleanings   aspirin EC 81 MG tablet Take 81 mg by mouth daily.   aspirin-acetaminophen-caffeine (EXCEDRIN MIGRAINE) 250-250-65 MG tablet Take 2 tablets by mouth daily as needed for headache or migraine.   atorvastatin (LIPITOR) 40 MG tablet Take 1 tablet (40 mg total) by mouth daily.   cholecalciferol (VITAMIN D3) 25 MCG (1000 UNIT) tablet Take 1,000 Units by mouth daily.   Cyanocobalamin (B-12 PO) Take 1 capsule by mouth daily.   Fluticasone-Umeclidin-Vilant  (TRELEGY ELLIPTA) 100-62.5-25 MCG/ACT AEPB Inhale 1 puff into the lungs daily.   ibuprofen (ADVIL) 800 MG tablet Take 800 mg by mouth daily as needed for headache.   icosapent Ethyl (VASCEPA) 1 g capsule Take 2 capsules (2 g total) by mouth 2 (two) times daily. (Patient taking differently: Take 2 g by mouth daily.)   metoprolol succinate (TOPROL XL) 25 MG 24 hr tablet Take 0.5 tablets (12.5 mg total) by mouth daily.   nicotine (NICODERM CQ - DOSED IN MG/24 HOURS) 21 mg/24hr patch Place 1 patch (21 mg total) onto the skin daily.   senna-docusate (SENNA S) 8.6-50 MG tablet Take 1-2 tablets by mouth See admin instructions. Take 2 tablets in the morning and 1 tablet at night     Allergies:   Tiotropium bromide monohydrate   Social History   Socioeconomic History   Marital status: Married    Spouse name: Not on file   Number of children: Not on file   Years of education: Not on file   Highest education level: Not on file  Occupational History   Not on file  Tobacco Use   Smoking status: Every Day    Packs/day: 2.00    Types: Cigarettes   Smokeless tobacco: Never  Substance and Sexual Activity   Alcohol use: Yes    Comment: Occasional   Drug use: No   Sexual activity: Not on file  Other Topics Concern   Not on file  Social History Narrative   Not on file   Social Determinants of Health   Financial Resource Strain: Not on file  Food Insecurity: Not on file  Transportation Needs: Not on file  Physical Activity: Not on file  Stress: Not on file  Social Connections: Not on file     Family History: The patient's family history includes AAA (abdominal aortic aneurysm) in his mother; Cancer in his father.  ROS:   Please see the history of present illness.    All other systems reviewed and are negative.  EKGs/Labs/Other Studies Reviewed:    The following studies were reviewed today:  TAVR OPERATIVE NOTE     Date of Procedure:                04/17/2022   Preoperative  Diagnosis:      Severe Aortic Stenosis    Postoperative Diagnosis:    Same    Procedure:        Transcatheter Aortic Valve Replacement - Percutaneous  Transfemoral Approach             Edwards Sapien 3 Ultra Resilia THV (size 26 mm, serial # [id number])              Co-Surgeons:  Gaye Pollack, MD and Sherren Mocha, MD   Anesthesiologist:                  Suella Broad, MD   Echocardiographer:              Sanda Klein, MD   Pre-operative Echo Findings: Severe aortic stenosis Normal left ventricular systolic function   Post-operative Echo Findings: No paravalvular leak Normal/unchanged left ventricular systolic function   _____________    Echo 04/18/22:  IMPRESSIONS  1. Left ventricular ejection fraction, by estimation, is 60 to 65%. The  left ventricle has normal function. The left ventricle has no regional  wall motion abnormalities. There is mild concentric left ventricular  hypertrophy. Left ventricular diastolic  parameters are consistent with Grade II diastolic dysfunction  (pseudonormalization). Elevated left atrial pressure.   2. Right ventricular systolic function is normal. The right ventricular  size is normal.   3. The mitral valve is normal in structure. No evidence of mitral valve  regurgitation. No evidence of mitral stenosis.   4. The aortic valve has been repaired/replaced. Aortic valve  regurgitation is not visualized. No aortic stenosis is present. There is a  26 mm Sapien prosthetic (TAVR) valve present in the aortic position.  Procedure Date: 04/17/2022. Aortic valve mean  gradient measures 12.2 mmHg. Aortic valve Vmax measures 2.39 m/s. Aortic  valve acceleration time measures 85 msec.   5. The inferior vena cava is normal in size with greater than 50%  respiratory variability, suggesting right atrial pressure of 3 mmHg.   ________________________  Echo 05/20/22 IMPRESSIONS   1. Left ventricular ejection fraction, by  estimation, is 50 to 55%. The  left ventricle has low normal function. The left ventricle has no regional  wall motion abnormalities. There is mild left ventricular hypertrophy.  Left ventricular diastolic  parameters are consistent with Grade I diastolic dysfunction (impaired  relaxation).   2. Right ventricular systolic function is normal. The right ventricular  size is normal. Tricuspid regurgitation signal is inadequate for assessing  PA pressure.   3. The mitral valve is grossly normal. Trivial mitral valve  regurgitation.   4. Aortic valve prosthetic gradient has increased. 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 Consider CT  to evaluate for valve dysfunction and leaflet thrombus. The aortic valve  has been repaired/replaced.  Aortic valve regurgitation is not visualized. There is a 26 mm Sapien  prosthetic (TAVR) valve present in the aortic position. Procedure Date:  04/17/2022. Aortic valve mean gradient measures 22.9 mmHg.   5. The inferior vena cava is normal in size with greater than 50%  respiratory variability, suggesting right atrial pressure of 3 mmHg.    EKG:  EKG is NOT ordered today.    Recent Labs: 04/18/2022: Hemoglobin 15.8; Magnesium 1.9; Platelets 86 05/21/2022: BUN 11; Creatinine, Ser 1.12; Potassium 4.5; Sodium 143  Recent Lipid Panel    Component Value Date/Time   CHOL 126 04/10/2022 0142   TRIG 309 (H) 04/10/2022 0142   HDL 36 (L) 04/10/2022 0142   CHOLHDL 3.5 04/10/2022 0142   VLDL 62 (H) 04/10/2022 0142   LDLCALC 28 04/10/2022 0142     Risk Assessment/Calculations:       Physical Exam:    VS:  BP 124/80   Pulse 64   Ht 5' 11.5" (1.816 m)   Wt 218 lb (98.9 kg)   SpO2 93%   BMI 29.98 kg/m  Wt Readings from Last 3 Encounters:  05/21/22 218 lb (98.9 kg)  04/25/22 218 lb 6.4 oz (99.1 kg)  04/18/22 219 lb 2.2 oz (99.4 kg)     GEN:  Well nourished, well developed in no acute  distress HEENT: Normal NECK: No JVD LYMPHATICS: No lymphadenopathy CARDIAC: RRR, no murmurs, rubs, gallops RESPIRATORY:  Clear to auscultation without rales, wheezing or rhonchi  ABDOMEN: Soft, non-tender, non-distended MUSCULOSKELETAL:  No edema; No deformity  SKIN: Warm and dry.   NEUROLOGIC:  Alert and oriented x 3 PSYCHIATRIC:  Normal affect   ASSESSMENT:    1. S/P TAVR (transcatheter aortic valve replacement)   2. Poor dentition   3. NICM (nonischemic cardiomyopathy) ([place])   4. Essential hypertension   5. Hyperlipidemia, unspecified hyperlipidemia type   6. Tobacco abuse   7. Abdominal aortic aneurysm (AAA) without rupture, unspecified part ([place])     PLAN:    In order of problems listed above:  Severe AS s/p TAVR: echo today shows EF 60%, s/p TAVR with a mean gradient of 23 mm hg and no PVL. Given increase in gradients, will get a cardiac CT to rule out leaflet thrombosis. He has NYHA class I symptoms and clinically doing quite well. SBE prophylaxis discussed; I have RX'd amoxicillin. Continue aspirin 81 mg daily. I will see him back for 1 year follow up   Poor dentition: followed by Dr. Benson Norway. Plan for dental extraction.    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 He has no s/s of CHF. Continue on Toprol XL 12.59m daily.    HTN: Bp well controlled on Toprol XL 12.580mdaily. No changes made   HLD: continue Atorvastatin 4045maily and Vascepa 2g twice daily.   COPD/Tobacco Abuse: PFTs with severe COPD. Counseled on cessation. Continue nicotine patches   AAA: pre TAVR CTs showed a 3.1 cm infrarenal abdominal aortic aneurysm. Recommended [place] years. This will be followed over time.    Medication Adjustments/Labs and Tests Ordered: Current medicines are reviewed at length with the patient today.  Concerns regarding medicines are outlined above.  Orders Placed This Encounter  Procedures   Basic metabolic panel   ECHOCARDIOGRAM COMPLETE   No  orders of the defined types were placed in this encounter.   Patient Instructions  Medication Instructions:  Your physician recommends that you continue on your current medications as directed. Please refer to the Current Medication list given to you today.  *If you need a refill on your cardiac medications before your next appointment, please call your pharmacy*   Lab Work: Bmp - today   If you have labs (blood work) drawn today and your tests are completely normal, you will receive your results only by: [place] you have MyChart) OR A paper copy in the mail If you have any lab test that is abnormal or we need to change your treatment, we will call you to review the results.   Testing/Procedures: Your physician has recommended that you have a cardiac CT on Monday, October 9. Please refer to instructions given.     Follow-Up: Follow up as scheduled   Other Instructions   Your cardiac CT will be scheduled at one of the below locations:   [facility] [phone] scheduled at [facility]En[facility]ance C2[facility] minutes prior to test start time. You  can use the FREE valet parking offered at entrance C (encouraged to control the heart rate for the test)  Proceed to the [facility] Radiology Department (first floor) to check-in and test prep.  All radiology patients and guests should use entrance C2 at [facility], accessed from [facility], even though the hospital's physical address listed is [address].    Please follow these instructions carefully (unless otherwise directed):  Hold all erectile dysfunction medications at least 3 days (72 hrs) prior to test. (Ie viagra, cialis, sildenafil, tadalafil, etc) We will administer nitroglycerin during this exam.   On the Night Before the Test: Be  sure to Drink plenty of water. Do not consume any caffeinated/decaffeinated beverages or chocolate 12 hours prior to your test. Do not take any antihistamines 12 hours prior to your test.  On the Day of the Test: Drink plenty of water until 1 hour prior to the test. You may take your regular medications prior to the test.  Take metoprolol two hours prior to test.      After the Test: Drink plenty of water. After receiving IV contrast, you may experience a mild flushed feeling. This is normal. On occasion, you may experience a mild rash up to 24 hours after the test. This is not dangerous. If this occurs, you can take Benadryl 25 mg and increase your fluid intake. If you experience trouble breathing, this can be serious. If it is severe call 911 IMMEDIATELY. If it is mild, please call our office. If you take any of these medications: Glipizide/Metformin, Avandament, Glucavance, please do not take 48 hours after completing test unless otherwise instructed.  For non-scheduling related questions, please contact the cardiac imaging nurse navigator should you have any questions/concerns: Marchia Bond, Cardiac Imaging Nurse Navigator Gordy Clement, Cardiac Imaging Nurse Navigator [place] [facility] Direct Office Dial: [phone]   For scheduling needs, including cancellations and rescheduling, please call [place], [phone].       Signed, Angelena Form, PA-C  05/22/2022 10:38 AM    [facility]

## 2022-05-21 ENCOUNTER — Ambulatory Visit (HOSPITAL_COMMUNITY): Payer: BC Managed Care – PPO | Attending: Internal Medicine | Admitting: Physician Assistant

## 2022-05-21 ENCOUNTER — Ambulatory Visit (HOSPITAL_BASED_OUTPATIENT_CLINIC_OR_DEPARTMENT_OTHER): Payer: BC Managed Care – PPO

## 2022-05-21 ENCOUNTER — Other Ambulatory Visit: Payer: Self-pay | Admitting: Physician Assistant

## 2022-05-21 VITALS — BP 124/80 | HR 64 | Ht 71.5 in | Wt 218.0 lb

## 2022-05-21 DIAGNOSIS — I428 Other cardiomyopathies: Secondary | ICD-10-CM | POA: Insufficient documentation

## 2022-05-21 DIAGNOSIS — Z720 Tobacco use: Secondary | ICD-10-CM | POA: Insufficient documentation

## 2022-05-21 DIAGNOSIS — Z952 Presence of prosthetic heart valve: Secondary | ICD-10-CM

## 2022-05-21 DIAGNOSIS — I10 Essential (primary) hypertension: Secondary | ICD-10-CM | POA: Diagnosis present

## 2022-05-21 DIAGNOSIS — E785 Hyperlipidemia, unspecified: Secondary | ICD-10-CM | POA: Insufficient documentation

## 2022-05-21 DIAGNOSIS — I714 Abdominal aortic aneurysm, without rupture, unspecified: Secondary | ICD-10-CM | POA: Diagnosis present

## 2022-05-21 DIAGNOSIS — K089 Disorder of teeth and supporting structures, unspecified: Secondary | ICD-10-CM | POA: Diagnosis not present

## 2022-05-21 NOTE — Patient Instructions (Addendum)
Medication Instructions:  Your physician recommends that you continue on your current medications as directed. Please refer to the Current Medication list given to you today.  *If you need a refill on your cardiac medications before your next appointment, please call your pharmacy*   Lab Work: Bmp - today   If you have labs (blood work) drawn today and your tests are completely normal, you will receive your results only by: [place] (if you have MyChart) OR A paper copy in the mail If you have any lab test that is abnormal or we need to change your treatment, we will call you to review the results.   Testing/Procedures: Your physician has recommended that you have a cardiac CT on Monday, October 9. Please refer to instructions given.     Follow-Up: Follow up as scheduled   Other Instructions   Your cardiac CT will be scheduled at one of the below locations:   [facility] [address] [place], [place] [address] [phone]   If scheduled at [facility], please arrive at the [facility] and Children's Entrance (Entrance C2) of [facility] 30 minutes prior to test start time. You can use the FREE valet parking offered at entrance C (encouraged to control the heart rate for the test)  Proceed to the [facility] Radiology Department (first floor) to check-in and test prep.  All radiology patients and guests should use entrance C2 at [facility], accessed from [facility], even though the hospital's physical address listed is [address]    Please follow these instructions carefully (unless otherwise directed):  Hold all erectile dysfunction medications at least 3 days (72 hrs) prior to test. (Ie viagra, cialis, sildenafil, tadalafil, etc) We will administer nitroglycerin during this exam.   On the Night Before the Test: Be sure to Drink plenty of water. Do not consume any caffeinated/decaffeinated beverages  or chocolate 12 hours prior to your test. Do not take any antihistamines 12 hours prior to your test.  On the Day of the Test: Drink plenty of water until 1 hour prior to the test. You may take your regular medications prior to the test.  Take metoprolol two hours prior to test.      After the Test: Drink plenty of water. After receiving IV contrast, you may experience a mild flushed feeling. This is normal. On occasion, you may experience a mild rash up to 24 hours after the test. This is not dangerous. If this occurs, you can take Benadryl 25 mg and increase your fluid intake. If you experience trouble breathing, this can be serious. If it is severe call 911 IMMEDIATELY. If it is mild, please call our office. If you take any of these medications: Glipizide/Metformin, Avandament, Glucavance, please do not take 48 hours after completing test unless otherwise instructed.  For non-scheduling related questions, please contact the cardiac imaging nurse navigator should you have any questions/concerns: Marchia Bond, Cardiac Imaging Nurse Navigator Gordy Clement, Cardiac Imaging Nurse Navigator [place] [facility] Direct Office Dial: [phone]   For scheduling needs, including cancellations and rescheduling, please call [place], [phone].

## 2022-05-22 LAB — ECHOCARDIOGRAM COMPLETE
AR max vel: 1.38 cm2
AV Area VTI: 1.22 cm2
AV Area mean vel: 1.13 cm2
AV Mean grad: 22.9 mmHg
AV Peak grad: 36.3 mmHg
Ao pk vel: 3.01 m/s
Area-P 1/2: 2.09 cm2
S' Lateral: 3.7 cm

## 2022-05-22 LAB — BASIC METABOLIC PANEL
BUN/Creatinine Ratio: 10 (ref 9–20)
BUN: 11 mg/dL (ref 6–24)
CO2: 21 mmol/L (ref 20–29)
Calcium: 9.5 mg/dL (ref 8.7–10.2)
Chloride: 103 mmol/L (ref 96–106)
Creatinine, Ser: 1.12 mg/dL (ref 0.76–1.27)
Glucose: 78 mg/dL (ref 70–99)
Potassium: 4.5 mmol/L (ref 3.5–5.2)
Sodium: 143 mmol/L (ref 134–144)
eGFR: 78 mL/min/{1.73_m2} (ref >59)

## 2022-05-26 DIAGNOSIS — K0889 Other specified disorders of teeth and supporting structures: Secondary | ICD-10-CM | POA: Insufficient documentation

## 2022-05-26 DIAGNOSIS — M264 Malocclusion, unspecified: Secondary | ICD-10-CM | POA: Insufficient documentation

## 2022-05-26 DIAGNOSIS — F40232 Fear of other medical care: Secondary | ICD-10-CM | POA: Insufficient documentation

## 2022-05-26 DIAGNOSIS — K08109 Complete loss of teeth, unspecified cause, unspecified class: Secondary | ICD-10-CM | POA: Insufficient documentation

## 2022-05-26 DIAGNOSIS — K117 Disturbances of salivary secretion: Secondary | ICD-10-CM | POA: Insufficient documentation

## 2022-05-26 DIAGNOSIS — K045 Chronic apical periodontitis: Secondary | ICD-10-CM | POA: Insufficient documentation

## 2022-05-26 DIAGNOSIS — Z0120 Encounter for dental examination and cleaning without abnormal findings: Secondary | ICD-10-CM | POA: Insufficient documentation

## 2022-06-02 ENCOUNTER — Telehealth: Payer: Self-pay | Admitting: Cardiovascular Disease

## 2022-06-02 ENCOUNTER — Other Ambulatory Visit: Payer: Self-pay | Admitting: Physician Assistant

## 2022-06-02 ENCOUNTER — Ambulatory Visit (HOSPITAL_COMMUNITY)
Admission: RE | Admit: 2022-06-02 | Discharge: 2022-06-02 | Disposition: A | Discharge status: Home / Self Care | Payer: BC Managed Care – PPO | Source: Ambulatory Visit | Attending: Physician Assistant | Admitting: Physician Assistant

## 2022-06-02 DIAGNOSIS — Z952 Presence of prosthetic heart valve: Secondary | ICD-10-CM | POA: Diagnosis not present

## 2022-06-02 DIAGNOSIS — D6859 Other primary thrombophilia: Secondary | ICD-10-CM

## 2022-06-02 MED ORDER — METOPROLOL SUCCINATE ER 25 MG PO TB24: 12.5000 mg | ORAL_TABLET | Freq: Every day | ORAL | 3 refills | Status: DC

## 2022-06-02 MED ORDER — APIXABAN 5 MG PO TABS: 5.0000 mg | ORAL_TABLET | Freq: Two times a day (BID) | ORAL | 1 refills | Status: DC

## 2022-06-02 MED ORDER — ICOSAPENT ETHYL 1 G PO CAPS: 2.0000 g | ORAL_CAPSULE | Freq: Two times a day (BID) | ORAL | 3 refills | Status: DC

## 2022-06-02 MED ORDER — APIXABAN (ELIQUIS) VTE STARTER PACK (10MG AND 5MG): ORAL_TABLET | ORAL | 0 refills | Status: DC

## 2022-06-02 MED ORDER — IOHEXOL 350 MG/ML SOLN
95.0000 mL | Freq: Once | INTRAVENOUS | Status: AC | PRN
  Administered 2022-06-02: 95 mL via INTRAVENOUS

## 2022-06-02 NOTE — Addendum Note (Signed)
Addended by: Eileen Stanford on: 06/02/2022 04:01 PM   Modules accepted: Orders

## 2022-06-02 NOTE — Addendum Note (Signed)
Addended by: Eileen Stanford on: 06/02/2022 01:57 PM   Modules accepted: Orders

## 2022-06-02 NOTE — Telephone Encounter (Signed)
Calling with critical findings on pt's TAVR. Call transferred

## 2022-06-03 ENCOUNTER — Telehealth: Payer: Self-pay | Admitting: Physician Assistant

## 2022-06-03 NOTE — Telephone Encounter (Signed)
Scheduled appt per 10/9 referral. Pt is aware of appt date and time. Pt is aware to arrive 15 mins prior to appt time and to bring and updated insurance card. Pt is aware of appt location.

## 2022-06-24 NOTE — Progress Notes (Signed)
[place] [phone]   Fax:([phone]) [place] NOTE  Patient Care Team: Coolidge Breeze, FNP as PCP - General (Family Medicine) Sherren Mocha, MD as PCP - Cardiology (Cardiology)   CHIEF COMPLAINTS/PURPOSE OF CONSULTATION:  Acute pulonary embolism  HISTORY OF PRESENTING ILLNESS:  Bob Ross 56 y.o. male with medical history significant for metastatic colon cancer diagnosed in 2004 underwent chemotherapy and surgery currently in remission.  He presents to the hematology clinic for recent diagnosis of acute pulmonary embolism.  He is accompanied by his wife for this visit.  On review of the previous records, Bob Ross underwent TAVR on 04/17/2022 after presenting with an NSTEMI and work-up revealed severe aortic stenosis. Due to increased gradients across the prosthesis, he underwent cardiac CT on 06/02/2022.  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  Patient was initiated on Eliquis therapy  On exam today, Mr. Bob Ross reports that his chest pain and shortness of breath are slowly improving since starting anticoagulation.  He is able to complete all his daily activities on his own.  He is tolerating Eliquis therapy without any prohibitive toxicities.  He denies easy bruising or signs of active bleeding.  Patient denies any fevers, chills, night sweats, cough, nausea, vomiting or diarrhea.  He has no other complaints.  Rest of the 10 point ROS is below.  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 Burt Knack & Dr. Cyndia Bent   Severe aortic stenosis     SURGICAL HISTORY: Past Surgical History:  Procedure Laterality Date   COLON SURGERY     COLOSTOMY     COLOSTOMY REVERSAL     HERNIA REPAIR     INTRAOPERATIVE TRANSTHORACIC ECHOCARDIOGRAM N/A 04/17/2022   Procedure: INTRAOPERATIVE TRANSTHORACIC ECHOCARDIOGRAM;  Surgeon: Sherren Mocha, MD;  Location: [place];  Service: Open Heart Surgery;  Laterality: N/A;   LEFT HEART CATH AND CORONARY ANGIOGRAPHY N/A 04/09/2022   Procedure: LEFT HEART CATH AND CORONARY ANGIOGRAPHY;  Surgeon: Early Osmond, MD;  Location: [place] CV LAB;  Service: Cardiovascular;  Laterality: N/A;   LIVER RESECTION     TRANSCATHETER AORTIC VALVE REPLACEMENT, TRANSFEMORAL N/A 04/17/2022   Procedure: Transcatheter Aortic Valve Replacement, Transfemoral;  Surgeon: Sherren Mocha, MD;  Location: [place];  Service: Open Heart Surgery;  Laterality: N/A;    SOCIAL HISTORY: Social History   Socioeconomic History   Marital status: Married    Spouse name: Not on file   Number of children: Not on file   Years of education: Not on file   Highest education level: Not on file  Occupational History   Not on file  Tobacco Use   Smoking status: Every Day    Packs/day: 1.50    Years: 45.00    Total pack years: 67.50    Types: Cigarettes   Smokeless tobacco:  Never  Substance and Sexual Activity   Alcohol use: Yes    Comment: Occasional   Drug use: No   Sexual activity: Not on file  Other Topics Concern   Not on file  Social History Narrative   Not on file   Social Determinants of Health   Financial Resource Strain: Not on file  Food Insecurity: Not on file  Transportation Needs: Not on file  Physical Activity: Not on file  Stress: Not on file  Social Connections: Not on file  Intimate Partner Violence: Not on  file    FAMILY HISTORY: Family History  Problem Relation Age of Onset   AAA (abdominal aortic aneurysm) Mother    Cancer Father     ALLERGIES:  is allergic to tiotropium bromide monohydrate.  MEDICATIONS:  Current Outpatient Medications  Medication Sig Dispense Refill   albuterol (VENTOLIN HFA) 108 (90 Base) MCG/ACT inhaler Inhale 2 puffs into the lungs every 4 (four) hours as needed for wheezing or shortness of breath. 1 each 3   ALBUTEROL IN Inhale into the lungs as needed.     amoxicillin (AMOXIL) 500 MG capsule Take 500 mg by mouth as needed.     amoxicillin (AMOXIL) 500 MG tablet Take 4 tablets by mouth 1 hour before dental procedures and cleanings 12 tablet 6   apixaban (ELIQUIS) 5 MG TABS tablet Take 1 tablet (5 mg total) by mouth 2 (two) times daily. 180 tablet 1   atorvastatin (LIPITOR) 40 MG tablet Take 1 tablet (40 mg total) by mouth daily. 90 tablet 3   cholecalciferol (VITAMIN D3) 25 MCG (1000 UNIT) tablet Take 1,000 Units by mouth daily.     Cyanocobalamin (B-12 PO) Take 1 capsule by mouth daily.     Fluticasone-Umeclidin-Vilant (TRELEGY ELLIPTA) 100-62.5-25 MCG/ACT AEPB Inhale 1 puff into the lungs daily.     ibuprofen (ADVIL) 800 MG tablet Take 800 mg by mouth daily as needed for headache.     icosapent Ethyl (VASCEPA) 1 g capsule Take 2 capsules (2 g total) by mouth 2 (two) times daily. 120 capsule 3   metoprolol succinate (TOPROL XL) 25 MG 24 hr tablet Take 0.5 tablets (12.5 mg total) by mouth daily. 90 tablet 3   senna-docusate (SENNA S) 8.6-50 MG tablet Take 1-2 tablets by mouth See admin instructions. Take 2 tablets in the morning and 1 tablet at night     traMADol (ULTRAM) 50 MG tablet Take 1 tablet (50 mg total) by mouth every 6 (six) hours as needed. 15 tablet 0   albuterol (PROVENTIL) (2.5 MG/3ML) 0.083% nebulizer solution Take 3 mLs (2.5 mg total) by nebulization every 6 (six) hours as needed for wheezing or shortness of breath. (Patient not taking: Reported on  04/25/2022) 75 mL 0   APIXABAN (ELIQUIS) VTE STARTER PACK ('10MG'$  AND '5MG'$ ) Take as directed on package: start with two-'5mg'$  tablets twice daily for 7 days. On day 8, switch to one-'5mg'$  tablet twice daily. (Patient not taking: Reported on 06/25/2022) 1 each 0   nicotine (NICODERM CQ - DOSED IN MG/24 HOURS) 21 mg/24hr patch Place 1 patch (21 mg total) onto the skin daily. (Patient not taking: Reported on 06/25/2022) 30 patch 6   No current facility-administered medications for this visit.    REVIEW OF SYSTEMS:   Constitutional: ( - ) fevers, ( - )  chills , ( - ) night sweats Eyes: ( - ) blurriness of vision, ( - ) double vision, ( - ) watery eyes Ears, nose, mouth,  throat, and face: ( - ) mucositis, ( - ) sore throat Respiratory: ( - ) cough, ( + ) dyspnea, ( - ) wheezes Cardiovascular: ( - ) palpitation, ( - ) chest discomfort, ( - ) lower extremity swelling Gastrointestinal:  ( - ) nausea, ( - ) heartburn, ( - ) change in bowel habits Skin: ( - ) abnormal skin rashes Lymphatics: ( - ) new lymphadenopathy, ( - ) easy bruising Neurological: ( - ) numbness, ( - ) tingling, ( - ) new weaknesses Behavioral/Psych: ( - ) mood change, ( - ) new changes  All other systems were reviewed with the patient and are negative.  PHYSICAL EXAMINATION: ECOG PERFORMANCE STATUS: 1 - Symptomatic but completely ambulatory  Vitals:   06/25/22 1058  BP: (!) 142/70  Pulse: (!) 58  Resp: 17  Temp: 97.7 F (36.5 C)  SpO2: 94%   Filed Weights   06/25/22 1058  Weight: 225 lb (102.1 kg)    GENERAL: well appearing male in NAD  SKIN: skin color, texture, turgor are normal, no rashes or significant lesions EYES: conjunctiva are pink and non-injected, sclera clear OROPHARYNX: no exudate, no erythema; lips, buccal mucosa, and tongue normal  NECK: supple, non-tender LYMPH:  no palpable lymphadenopathy in the cervical or supraclavicular lymph nodes.  LUNGS: clear to auscultation and percussion with normal breathing  effort HEART: regular rate & rhythm and no murmurs and no lower extremity edema ABDOMEN: soft, non-tender, non-distended, normal bowel sounds Musculoskeletal: no cyanosis of digits and no clubbing  PSYCH: alert & oriented x 3, fluent speech NEURO: no focal motor/sensory deficits  LABORATORY DATA:  I have reviewed the data as listed    Latest Ref Rng & Units 06/25/2022   12:20 PM 04/18/2022    3:27 AM 04/17/2022    4:49 PM  CBC  WBC 4.0 - 10.5 K/uL 8.8  11.3    Hemoglobin 13.0 - 17.0 g/dL 16.6  15.8  15.6   Hematocrit 39.0 - 52.0 % 49.9  48.4  46.0   Platelets 150 - 400 K/uL 91  86         Latest Ref Rng & Units 06/25/2022   12:20 PM 05/21/2022    2:40 PM 04/18/2022    3:27 AM  CMP  Glucose 70 - 99 mg/dL 83  78  104   BUN 6 - 20 mg/dL '12  11  22   '$ Creatinine 0.61 - 1.24 mg/dL 1.16  1.12  1.22   Sodium 135 - 145 mmol/L 140  143  137   Potassium 3.5 - 5.1 mmol/L 4.2  4.5  4.4   Chloride 98 - 111 mmol/L 107  103  108   CO2 22 - 32 mmol/L '28  21  18   '$ Calcium 8.9 - 10.3 mg/dL 9.3  9.5  8.9   Total Protein 6.5 - 8.1 g/dL 7.3     Total Bilirubin 0.3 - 1.2 mg/dL 0.7     Alkaline Phos 38 - 126 U/L 81     AST 15 - 41 U/L 19     ALT 0 - 44 U/L 17       RADIOGRAPHIC STUDIES: I have personally reviewed the radiological images as listed and agreed with the findings in the report. CT CORONARY MORPH W/CTA COR W/SCORE W/CA W/CM &/OR WO/CM  Addendum Date: 06/02/2022   ADDENDUM REPORT: 06/02/2022 15:18 CLINICAL DATA:  S/p TAVR. Increased gradients across the prosthesis. 26 mm S3 04/17/2022. EXAM: Cardiac TAVR CT TECHNIQUE:  A non-contrast, gated CT scan was obtained with axial slices of 3 mm through the heart for aortic valve calcium scoring. A 120 kV retrospective, gated, contrast cardiac scan was obtained. Gantry rotation speed was 250 msecs and collimation was 0.6 mm. Nitroglycerin was not given. The 3D data set was reconstructed in 5% intervals of the 0-95% of the R-R cycle. Systolic and  diastolic phases were analyzed on a dedicated workstation using MPR, MIP, and VRT modes. The patient received 100 cc of contrast. FINDINGS: Image quality: Excellent. Noise artifact is: Limited. Aortic valve prosthesis: A 26 mm S3 TAVR is present. There is severe HALT with HAM of the leaflets of the NCC/LCC. The HALT covers all of these leaflets. There is HALT at the base of the leaflet of the RCC. There is no evidence of paravalvular leak. This is consistent with leaflet thrombosis. Coronary Arteries: Normal coronary origin. Right dominance. The study was performed without use of NTG and is insufficient for plaque evaluation. Please refer to recent cardiac catheterization for coronary assessment. 3-vessel coronary calcifications noted. Cardiac Morphology: Right Atrium: Right atrial size is within normal limits. Right Ventricle: The right ventricular cavity is within normal limits. Left Atrium: Left atrial size is normal in size with no left atrial appendage filling defect. Left Ventricle: The ventricular cavity size is within normal limits. Pulmonary arteries: Normal in size. Pulmonary veins: Normal pulmonary venous drainage. Pericardium: Normal thickness with no significant effusion or calcium present. Mitral Valve: The mitral valve is normal structure without significant calcification. Aorta: Normal caliber. Extra-cardiac findings: See attached radiology report for non-cardiac structures. IMPRESSION: 1. 26 mm S3 with severe HALT/HAM consistent with leaflet thrombosis. 2. 3-vessel coronary calcifications. [place] T. Audie Box, MD Electronically Signed   By: Eleonore Chiquito M.D.   On: 06/02/2022 15:18   Result Date: 06/02/2022 EXAM: OVER-READ INTERPRETATION  CT CHEST The following report is a limited chest CT over-read performed by radiologist Dr. Yetta Glassman of [facility] [place] on 06/02/2022. This over-read does not include interpretation of cardiac or coronary anatomy or pathology. The coronary CTA  interpretation by the cardiologist is attached. COMPARISON:  Cardiac CT dated April 10, 2022 FINDINGS: Vascular: Acute pulmonary embolus of the distal right pulmonary artery with extension into the right upper lobe lobar artery, finding is new when compared with prior heart CT dated April 10, 2022. No CT evidence of right heart strain (RV/LV ratio of 0.7). Interval transcatheter aortic valve replacement. Normal caliber thoracic aorta with mild atherosclerotic disease. Mediastinum/Nodes: Esophagus is unremarkable. No pathologically enlarged lymph nodes seen in the visualized chest. Calcified mediastinal lymph nodes, likely sequela of prior granulomatous infection Lungs/Pleura: Trace right pleural effusion and right basilar atelectasis. Linear opacities of the lower right lung, likely due to scarring or atelectasis. Calcified granulomas of the right lower lobe. Upper Abdomen: No acute abnormality. Musculoskeletal: No chest wall mass or suspicious bone lesions identified. IMPRESSION: 1. Acute pulmonary embolus of the distal right pulmonary artery with extension into the right upper lobe lobar artery. No CT evidence of right heart strain. 2. Trace right pleural effusion and right basilar atelectasis. 3. Aortic Atherosclerosis (ICD10-I70.0). Critical Value/emergent results were called by telephone at the time of interpretation on 06/02/2022 at 1:11 pm to provider Kathyrn Drown, NP, who verbally acknowledged these results. Electronically Signed: By: Yetta Glassman M.D. On: 06/02/2022 13:14    ASSESSMENT & PLAN Bob Ross is a 56 y.o. male who presents to the hematology clinic for evaluation of acute pulmonary embolism. We reviewed possible  etiologies for venous thromboembolisms including prolonged travel/immobility, surgery (particular abdominal or orthropedic), trauma,  and pregnancy/ estrogen containing birth control. After a detailed history and review of the records there is no clear provoking factor for this  patient's VTE.  Patients with unprovoked VTEs have up to 25% recurrence after 5 years and 36% at 10 years, with 4% of these clots being fatal ([id number] [phone]). Therefore the formal recommendation for unprovoked VTE's is lifelong anticoagulation, as the cause may not be transient or reversible. We recommend 6 months or full strength anticoagulation with a re-evaluation after that time.  The patient's will then have a choice of maintenance dose DOAC (preferred, recommended), '81mg'$  ASA PO daily (non-preferred), or no further anticoagulation (not recommended).   #Unprovoked Pulmonary Embolism  --findings at this time are consistent with a unprovoked VTE --will order baseline CMP and CBC to assure labs are adequate for DOAC therapy --will check labs to rule out antiphospholipid syndrome.  --recommend the patient continue eliquis '5mg'$  BID --patient denies any bleeding, bruising, or dark stools on this medication. It is well tolerated. No difficulties accessing/affording the medication --RTC in 5 months' time with strict return precautions for overt signs of bleeding.    Orders Placed This Encounter  Procedures   CBC with Differential ([place] Only)    Standing Status:   Future    Number of Occurrences:   1    Standing Expiration Date:   06/26/2023   CMP ([place] only)    Standing Status:   Future    Number of Occurrences:   1    Standing Expiration Date:   06/26/2023   Cardiolipin antibodies, IgG, IgM, IgA*    Standing Status:   Future    Number of Occurrences:   1    Standing Expiration Date:   06/25/2023   Beta-2-glycoprotein i abs, IgG/M/A    Standing Status:   Future    Number of Occurrences:   1    Standing Expiration Date:   06/25/2023   Lupus anticoagulant panel*    Standing Status:   Future    Number of Occurrences:   1    Standing Expiration Date:   06/25/2023    All questions were answered. The patient knows to call the clinic with any problems, questions or  concerns.  I have spent a total of 60 minutes minutes of face-to-face and non-face-to-face time, preparing to see the patient, obtaining and/or reviewing separately obtained history, performing a medically appropriate examination, counseling and educating the patient, ordering tests/procedures, documenting clinical information in the electronic health record,and care coordination.   Dede Query, PA-C Department of Hematology/Oncology [place] at [facility] Phone: [phone]  Patient was seen with Dr. Lorenso Courier  I have read the above note and personally examined the patient. I agree with the assessment and plan as noted above.  Briefly Bob Ross is a 56 year old male who presents for evaluation of an unprovoked pulmonary embolism.  At this time the etiology is unclear.  He did undergo a TAVR procedure prior to the development of this clot, however that should not provoke a pulmonary embolism.  At this time would recommend continuation of his Eliquis therapy 5 mg twice daily with intention to continue indefinitely.  The patient voiced understanding of the plan moving forward.  We will perform a focused hypercoagulation work-up to include APS antibodies.   Ledell Peoples, MD Department of Hematology/Oncology [place] at [facility] Phone: [phone]  Pager: [phone] Email: [email or web address]

## 2022-06-25 ENCOUNTER — Inpatient Hospital Stay: Payer: BC Managed Care – PPO

## 2022-06-25 ENCOUNTER — Encounter: Payer: Self-pay | Admitting: Physician Assistant

## 2022-06-25 ENCOUNTER — Inpatient Hospital Stay: Payer: BC Managed Care – PPO | Attending: Physician Assistant | Admitting: Physician Assistant

## 2022-06-25 ENCOUNTER — Other Ambulatory Visit: Payer: Self-pay

## 2022-06-25 VITALS — BP 142/70 | HR 58 | Temp 97.7°F | Resp 17 | Ht 71.5 in | Wt 225.0 lb

## 2022-06-25 DIAGNOSIS — Z809 Family history of malignant neoplasm, unspecified: Secondary | ICD-10-CM | POA: Diagnosis not present

## 2022-06-25 DIAGNOSIS — Z85118 Personal history of other malignant neoplasm of bronchus and lung: Secondary | ICD-10-CM | POA: Diagnosis not present

## 2022-06-25 DIAGNOSIS — F17210 Nicotine dependence, cigarettes, uncomplicated: Secondary | ICD-10-CM | POA: Insufficient documentation

## 2022-06-25 DIAGNOSIS — I2699 Other pulmonary embolism without acute cor pulmonale: Secondary | ICD-10-CM | POA: Insufficient documentation

## 2022-06-25 DIAGNOSIS — Z7901 Long term (current) use of anticoagulants: Secondary | ICD-10-CM | POA: Diagnosis not present

## 2022-06-25 LAB — CBC WITH DIFFERENTIAL (CANCER CENTER ONLY)
Abs Immature Granulocytes: 0.02 10*3/uL (ref 0.00–0.07)
Basophils Absolute: 0 10*3/uL (ref 0.0–0.1)
Basophils Relative: 1 %
Eosinophils Absolute: 0.1 10*3/uL (ref 0.0–0.5)
Eosinophils Relative: 2 %
HCT: 49.9 % (ref 39.0–52.0)
Hemoglobin: 16.6 g/dL (ref 13.0–17.0)
Immature Granulocytes: 0 %
Lymphocytes Relative: 33 %
Lymphs Abs: 2.9 10*3/uL (ref 0.7–4.0)
MCH: 30.9 pg (ref 26.0–34.0)
MCHC: 33.3 g/dL (ref 30.0–36.0)
MCV: 92.9 fL (ref 80.0–100.0)
Monocytes Absolute: 0.6 10*3/uL (ref 0.1–1.0)
Monocytes Relative: 7 %
Neutro Abs: 5.2 10*3/uL (ref 1.7–7.7)
Neutrophils Relative %: 57 %
Platelet Count: 91 10*3/uL — ABNORMAL LOW (ref 150–400)
RBC: 5.37 MIL/uL (ref 4.22–5.81)
RDW: 13.9 % (ref 11.5–15.5)
WBC Count: 8.8 10*3/uL (ref 4.0–10.5)
nRBC: 0 % (ref 0.0–0.2)

## 2022-06-25 LAB — CMP (CANCER CENTER ONLY)
ALT: 17 U/L (ref 0–44)
AST: 19 U/L (ref 15–41)
Albumin: 4.1 g/dL (ref 3.5–5.0)
Alkaline Phosphatase: 81 U/L (ref 38–126)
Anion gap: 5 (ref 5–15)
BUN: 12 mg/dL (ref 6–20)
CO2: 28 mmol/L (ref 22–32)
Calcium: 9.3 mg/dL (ref 8.9–10.3)
Chloride: 107 mmol/L (ref 98–111)
Creatinine: 1.16 mg/dL (ref 0.61–1.24)
GFR, Estimated: >60 mL/min (ref >60)
Glucose, Bld: 83 mg/dL (ref 70–99)
Potassium: 4.2 mmol/L (ref 3.5–5.1)
Sodium: 140 mmol/L (ref 135–145)
Total Bilirubin: 0.7 mg/dL (ref 0.3–1.2)
Total Protein: 7.3 g/dL (ref 6.5–8.1)

## 2022-06-26 LAB — CARDIOLIPIN ANTIBODIES, IGG, IGM, IGA
Anticardiolipin IgA: <9 U/mL (ref 0–11)
Anticardiolipin IgG: <9 GPL U/mL (ref 0–14)
Anticardiolipin IgM: 14 [MPL'U]/mL — ABNORMAL HIGH (ref 0–12)

## 2022-06-26 LAB — BETA-2-GLYCOPROTEIN I ABS, IGG/M/A
Beta-2 Glyco I IgG: 20 GPI IgG units (ref 0–20)
Beta-2-Glycoprotein I IgA: 13 GPI IgA units (ref 0–25)
Beta-2-Glycoprotein I IgM: 13 GPI IgM units (ref 0–32)

## 2022-06-27 LAB — HEXAGONAL PHASE PHOSPHOLIPID: Hexagonal Phase Phospholipid: 8 s (ref 0–11)

## 2022-06-27 LAB — DRVVT CONFIRM: dRVVT Confirm: 1.3 ratio — ABNORMAL HIGH (ref 0.8–1.2)

## 2022-06-27 LAB — LUPUS ANTICOAGULANT PANEL
DRVVT: 75.5 s — ABNORMAL HIGH (ref 0.0–47.0)
PTT Lupus Anticoagulant: 45.7 s — ABNORMAL HIGH (ref 0.0–43.5)

## 2022-06-27 LAB — PTT-LA MIX: PTT-LA Mix: 42.7 s — ABNORMAL HIGH (ref 0.0–40.5)

## 2022-06-27 LAB — DRVVT MIX: dRVVT Mix: 48.9 s — ABNORMAL HIGH (ref 0.0–40.4)

## 2022-06-30 ENCOUNTER — Telehealth: Payer: Self-pay | Admitting: Physician Assistant

## 2022-06-30 ENCOUNTER — Telehealth: Payer: Self-pay | Admitting: Hematology and Oncology

## 2022-06-30 DIAGNOSIS — D696 Thrombocytopenia, unspecified: Secondary | ICD-10-CM

## 2022-06-30 NOTE — Telephone Encounter (Signed)
Per 11/6 secure chat called and left message for pt about appointment

## 2022-06-30 NOTE — Telephone Encounter (Signed)
I called Mr. Bob Ross to review the lab results from 06/25/2022. Findings show no evidence of antiphospholipid syndrome. There is stable thrombocytopenia, present as far back as 2019 per record review. CT scan from 04/10/2022 showed no evidence of liver disease or splenomegaly. Discussed proceeding with further workup for thrombocytopenia which we can pursue at the next visit, April 2024. Patient was in agreement with the plan and is aware to reach out to the clinic if his platelets worsen or he is unable to tolerate Eliquis therapy.

## 2022-07-14 ENCOUNTER — Telehealth (HOSPITAL_COMMUNITY): Payer: Self-pay

## 2022-07-20 ENCOUNTER — Other Ambulatory Visit: Payer: Self-pay

## 2022-07-20 ENCOUNTER — Encounter (HOSPITAL_COMMUNITY): Payer: Self-pay | Admitting: *Deleted

## 2022-07-20 ENCOUNTER — Emergency Department (HOSPITAL_COMMUNITY)
Admission: EM | Admit: 2022-07-20 | Discharge: 2022-07-20 | Disposition: A | Discharge status: Home / Self Care | Payer: BC Managed Care – PPO | Attending: Emergency Medicine | Admitting: Emergency Medicine

## 2022-07-20 DIAGNOSIS — I10 Essential (primary) hypertension: Secondary | ICD-10-CM | POA: Diagnosis not present

## 2022-07-20 DIAGNOSIS — L03317 Cellulitis of buttock: Secondary | ICD-10-CM | POA: Diagnosis not present

## 2022-07-20 DIAGNOSIS — M7989 Other specified soft tissue disorders: Secondary | ICD-10-CM | POA: Diagnosis not present

## 2022-07-20 DIAGNOSIS — F17210 Nicotine dependence, cigarettes, uncomplicated: Secondary | ICD-10-CM | POA: Insufficient documentation

## 2022-07-20 DIAGNOSIS — Z7901 Long term (current) use of anticoagulants: Secondary | ICD-10-CM | POA: Diagnosis not present

## 2022-07-20 DIAGNOSIS — Z79899 Other long term (current) drug therapy: Secondary | ICD-10-CM | POA: Insufficient documentation

## 2022-07-20 LAB — CBC WITH DIFFERENTIAL/PLATELET
Abs Immature Granulocytes: 0.04 10*3/uL (ref 0.00–0.07)
Basophils Absolute: 0 10*3/uL (ref 0.0–0.1)
Basophils Relative: 0 %
Eosinophils Absolute: 0.1 10*3/uL (ref 0.0–0.5)
Eosinophils Relative: 1 %
HCT: 48.7 % (ref 39.0–52.0)
Hemoglobin: 16.1 g/dL (ref 13.0–17.0)
Immature Granulocytes: 0 %
Lymphocytes Relative: 19 %
Lymphs Abs: 2.2 10*3/uL (ref 0.7–4.0)
MCH: 30.4 pg (ref 26.0–34.0)
MCHC: 33.1 g/dL (ref 30.0–36.0)
MCV: 91.9 fL (ref 80.0–100.0)
Monocytes Absolute: 0.9 10*3/uL (ref 0.1–1.0)
Monocytes Relative: 8 %
Neutro Abs: 8.4 10*3/uL — ABNORMAL HIGH (ref 1.7–7.7)
Neutrophils Relative %: 72 %
Platelets: 96 10*3/uL — ABNORMAL LOW (ref 150–400)
RBC: 5.3 MIL/uL (ref 4.22–5.81)
RDW: 13.9 % (ref 11.5–15.5)
WBC: 11.7 10*3/uL — ABNORMAL HIGH (ref 4.0–10.5)
nRBC: 0 % (ref 0.0–0.2)

## 2022-07-20 LAB — BASIC METABOLIC PANEL
Anion gap: 9 (ref 5–15)
BUN: 16 mg/dL (ref 6–20)
CO2: 24 mmol/L (ref 22–32)
Calcium: 8.9 mg/dL (ref 8.9–10.3)
Chloride: 105 mmol/L (ref 98–111)
Creatinine, Ser: 1.24 mg/dL (ref 0.61–1.24)
GFR, Estimated: >60 mL/min (ref >60)
Glucose, Bld: 113 mg/dL — ABNORMAL HIGH (ref 70–99)
Potassium: 4.2 mmol/L (ref 3.5–5.1)
Sodium: 138 mmol/L (ref 135–145)

## 2022-07-20 MED ORDER — VANCOMYCIN HCL IN DEXTROSE 1-5 GM/200ML-% IV SOLN
1000.0000 mg | Freq: Once | INTRAVENOUS | Status: AC
  Administered 2022-07-20: 1000 mg via INTRAVENOUS
  Filled 2022-07-20: qty 200

## 2022-07-20 MED ORDER — DOXYCYCLINE HYCLATE 100 MG PO CAPS: 100.0000 mg | ORAL_CAPSULE | Freq: Two times a day (BID) | ORAL | 0 refills | Status: DC

## 2022-07-20 MED ORDER — OXYCODONE-ACETAMINOPHEN 5-325 MG PO TABS: 1.0000 | ORAL_TABLET | Freq: Four times a day (QID) | ORAL | 0 refills | Status: DC | PRN

## 2022-07-20 MED ORDER — NAPROXEN 500 MG PO TABS: 500.0000 mg | ORAL_TABLET | Freq: Two times a day (BID) | ORAL | 0 refills | Status: DC

## 2022-07-20 MED ORDER — MORPHINE SULFATE (PF) 4 MG/ML IV SOLN
4.0000 mg | Freq: Once | INTRAVENOUS | Status: AC
  Administered 2022-07-20: 4 mg via INTRAVENOUS
  Filled 2022-07-20: qty 1

## 2022-07-20 NOTE — ED Provider Notes (Signed)
[facility] EMERGENCY DEPARTMENT Provider Note   CSN: [id number] Arrival date & time: 07/20/22  1516     History  Chief Complaint  Patient presents with   Abscess    Bob Ross is a 56 y.o. male.   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  The patient states about 5 days ago he developed a left buttock area of redness which has progressed in intensity with the size the location is very tender and hard to the touch, he has not had fevers or chills, he has been keeping a dressing on it using warm soaks and topical over-the-counter medications for abscesses but it is not getting any better.  Home Medications Prior to Admission medications   Medication Sig Start Date End Date Taking? Authorizing Provider  doxycycline (VIBRAMYCIN) 100 MG capsule Take 1 capsule (100 mg total) by mouth 2 (two) times daily. 07/20/22  Yes Noemi Chapel, MD  naproxen (NAPROSYN) 500 MG tablet Take 1 tablet (500 mg total) by mouth 2 (two) times daily with a meal. 07/20/22  Yes Noemi Chapel, MD  oxyCODONE-acetaminophen (PERCOCET/ROXICET) 5-325 MG tablet Take 1 tablet by mouth every 6 (six) hours as needed for severe pain. 07/20/22  Yes Noemi Chapel, MD  albuterol (PROVENTIL) (2.5 MG/3ML) 0.083% nebulizer solution Take 3 mLs (2.5 mg total) by nebulization every 6 (six) hours as needed for wheezing or shortness of breath. Patient not taking: Reported on 04/25/2022 10/03/17   Triplett, Tammy, PA-C  albuterol (VENTOLIN HFA) 108 (90 Base) MCG/ACT inhaler Inhale 2 puffs into the lungs every 4 (four) hours as needed for wheezing or shortness of breath. 03/29/21   Noemi Chapel, MD  ALBUTEROL IN Inhale into the lungs as needed.    [provider]  amoxicillin (AMOXIL) 500 MG tablet Take 4 tablets by mouth 1 hour  before dental procedures and cleanings 04/25/22   Eileen Stanford, PA-C  apixaban (ELIQUIS) 5 MG TABS tablet Take 1 tablet (5 mg total) by mouth 2 (two) times daily. 06/02/22   Eileen Stanford, PA-C  atorvastatin (LIPITOR) 40 MG tablet Take 1 tablet (40 mg total) by mouth daily. 04/25/22   Eileen Stanford, PA-C  cholecalciferol (VITAMIN D3) 25 MCG (1000 UNIT) tablet Take 1,000 Units by mouth daily.    [provider]  Cyanocobalamin (B-12 PO) Take 1 capsule by mouth daily.    [provider]  Fluticasone-Umeclidin-Vilant (TRELEGY ELLIPTA) 100-62.5-25 MCG/ACT AEPB Inhale 1 puff into the lungs daily.    [provider]  ibuprofen (ADVIL) 800 MG tablet Take 800 mg by mouth daily as needed for headache.    [provider]  icosapent Ethyl (VASCEPA) 1 g capsule Take 2 capsules (2 g total) by mouth 2 (two) times daily. 06/02/22   Eileen Stanford, PA-C  metoprolol succinate (TOPROL XL) 25 MG 24 hr tablet Take 0.5 tablets (12.5 mg total) by mouth daily. 06/02/22 06/02/23  Eileen Stanford, PA-C  nicotine (NICODERM CQ - DOSED IN MG/24 HOURS) 21 mg/24hr patch Place 1 patch (21 mg total) onto the skin daily. Patient not taking: Reported on 06/25/2022 04/25/22 04/25/23  Eileen Stanford, PA-C  senna-docusate (SENNA S) 8.6-50 MG tablet Take 1-2 tablets by mouth See admin instructions. Take 2 tablets in the morning and 1 tablet at night  [provider]  traMADol (ULTRAM) 50 MG tablet Take 1 tablet (50 mg total) by mouth every 6 (six) hours as needed. 11/19/21   Jaynee Eagles, PA-C      Allergies    Tiotropium bromide monohydrate    Review of Systems   Review of Systems  All other systems reviewed and are negative.   Physical Exam Updated Vital Signs BP (!) 153/93 (BP Location: Right Arm)   Pulse (!) 106   Temp 98.2 F (36.8 C) (Oral)   Resp 20   Ht 1.803 m ('5\' 11"'$ )   Wt 102.1 kg   SpO2 94%   BMI 31.38 kg/m  Physical Exam Vitals and nursing  note reviewed.  Constitutional:      General: He is not in acute distress.    Appearance: He is well-developed.  HENT:     Head: Normocephalic and atraumatic.     Mouth/Throat:     Pharynx: No oropharyngeal exudate.  Eyes:     General: No scleral icterus.       Right eye: No discharge.        Left eye: No discharge.     Conjunctiva/sclera: Conjunctivae normal.     Pupils: Pupils are equal, round, and reactive to light.  Neck:     Thyroid: No thyromegaly.     Vascular: No JVD.  Cardiovascular:     Rate and Rhythm: Normal rate and regular rhythm.     Heart sounds: Normal heart sounds. No murmur heard.    No friction rub. No gallop.  Pulmonary:     Effort: Pulmonary effort is normal. No respiratory distress.     Breath sounds: Normal breath sounds. No wheezing or rales.  Abdominal:     General: Bowel sounds are normal. There is no distension.     Palpations: Abdomen is soft. There is no mass.     Tenderness: There is no abdominal tenderness.  Musculoskeletal:        General: No tenderness. Normal range of motion.     Cervical back: Normal range of motion and neck supple.     Right lower leg: No edema.     Left lower leg: No edema.  Lymphadenopathy:     Cervical: No cervical adenopathy.  Skin:    General: Skin is warm and dry.     Findings: Erythema present. No rash.     Comments: There is a baseball sized location of the left buttock in diameter that has some central induration, there is no purulence, no fluctuance, surrounding redness expands approximately 4 inches in diameter, this is in the mid gluteus and does not involve anything around the rectum or anus  Neurological:     Mental Status: He is alert.     Coordination: Coordination normal.  Psychiatric:        Behavior: Behavior normal.     ED Results / Procedures / Treatments   Labs (all labs ordered are listed, but only abnormal results are displayed) Labs Reviewed  BASIC METABOLIC PANEL - Abnormal; Notable for  the following components:      Result Value   Glucose, Bld 113 (*)    All other components within normal limits  CBC WITH DIFFERENTIAL/PLATELET - Abnormal; Notable for the following components:   WBC 11.7 (*)    Platelets 96 (*)    Neutro Abs 8.4 (*)    All other components within normal limits    EKG None  Radiology No results found.  Procedures Ultrasound  ED Soft Tissue  Date/Time: 07/20/2022 3:55 PM  Performed by: Noemi Chapel, MD Authorized by: Noemi Chapel, MD   Procedure details:    Indications: localization of abscess and evaluate for cellulitis     Transverse view:  Visualized   Longitudinal view:  Visualized   Images: archived     Limitations:  Body habitus, patient compliance and positioning Location:    Location: buttocks     Side:  Left Findings:     no abscess present    cellulitis present    no foreign body present Comments:           Medications Ordered in ED Medications  morphine (PF) 4 MG/ML injection 4 mg (has no administration in time range)  vancomycin (VANCOCIN) IVPB 1000 mg/200 mL premix (1,000 mg Intravenous New Bag/Given 07/20/22 1609)    ED Course/ Medical Decision Making/ A&P                           Medical Decision Making Amount and/or Complexity of Data Reviewed Labs: ordered.  Risk Prescription drug management.   This patient presents to the ED for concern of abscess of the buttock versus cellulitis differential diagnosis includes abscess versus cellulitis    Additional history obtained:  Additional history obtained from electronic medical record External records from outside source obtained and reviewed including prior cath notes and interventions from several months ago when he had his heart issues   Lab Tests:  I Ordered, and personally interpreted labs.  The pertinent results include: Minimal leukocytosis, 05.1, metabolic panel is unremarkable, there is no hyperglycemia and no renal dysfunction   Imaging  Studies ordered:  I ordered imaging studies including bedside ultrasound I independently visualized and interpreted imaging which showed cellulitis present with cobblestoning, no central abscess collection was identified in both transverse and longitudinal views It was a bedside ultrasound that I performed and archived the images, see procedure note   Medicines ordered and prescription drug management:  I ordered medication including vancomycin and morphine for pain and infection Reevaluation of the patient after these medicines showed that the patient improved I have reviewed the patients home medicines and have made adjustments as needed   Problem List / ED Course:  Patient has cellulitis with induration but no abscess identified.  He will need very close follow-up and may very well need incision and drainage at a later point if it is not improving however he has been given intravenous antibiotics and will need to be started on some oral antibiotics for MRSA coverage   Social Determinants of Health:  Continues to smoke cigarettes, counseled for same           Final Clinical Impression(s) / ED Diagnoses Final diagnoses:  Cellulitis of left buttock    Rx / DC Orders ED Discharge Orders          Ordered    oxyCODONE-acetaminophen (PERCOCET/ROXICET) 5-325 MG tablet  Every 6 hours PRN        07/20/22 1707    doxycycline (VIBRAMYCIN) 100 MG capsule  2 times daily        07/20/22 1707    naproxen (NAPROSYN) 500 MG tablet  2 times daily with meals        07/20/22 1707              Noemi Chapel, MD 07/20/22 1709

## 2022-07-20 NOTE — Discharge Instructions (Addendum)
The ultrasound that we have performed shows that there is no collection of pus, you do have cellulitis which is when a staph infection gets under the skin and can cause redness pain fever and tenderness.  We have given you IV medicines which should help to start the healing process but you will need to continue to take doxycycline for the next 10 days  There is a small chance that this gets worse and larger and needs to be cut open so if things are going in the wrong direction please come back to the emergency department immediately  Doxycycline is an antibiotic which is taken twice a day, this treats bacterial infections that can cause staph infections, it treats sinus infections and some pneumonia.  In this case I would like for you to take the antibiotic exactly as prescribed until it is completed.  Please be aware that occasionally people will get a rash if they are in the sunlight for extended periods of time while taking this medicine.  Please take Naprosyn, '500mg'$  by mouth twice daily as needed for pain - this in an antiinflammatory medicine (NSAID) and is similar to ibuprofen - many people feel that it is stronger than ibuprofen and it is easier to take since it is a smaller pill.  Please use this only for 1 week - if your pain persists, you will need to follow up with your doctor in the office for ongoing guidance and pain control.  I have also given you a medication called Percocet which is a very strong pain medication and should be only used if absolutely needed.  This can cause nausea and constipation.  You may take 1 tablet every 6 hours as needed.  This is a controlled substance  Thank you for allowing [place] to treat you in the emergency department today.  After reviewing your examination and potential testing that was done it appears that you are safe to go home.  I would like for you to follow-up with your doctor within the next several days, have them obtain your results and follow-up with  them to review all of these tests.  If you should develop severe or worsening symptoms return to the emergency department immediately

## 2022-07-20 NOTE — ED Triage Notes (Signed)
Pt with abscess to left buttock per pt x 5 days.  Pt with heart value placed end of  August, concerned for infection.  Denies any fevers or N/V

## 2022-07-29 ENCOUNTER — Encounter: Payer: Self-pay | Admitting: General Surgery

## 2022-07-29 ENCOUNTER — Ambulatory Visit: Payer: BC Managed Care – PPO | Admitting: General Surgery

## 2022-07-29 ENCOUNTER — Other Ambulatory Visit: Payer: Self-pay | Admitting: *Deleted

## 2022-07-29 VITALS — BP 151/81 | HR 75 | Temp 98.0°F | Resp 16 | Ht <= 58 in | Wt 216.0 lb

## 2022-07-29 DIAGNOSIS — L723 Sebaceous cyst: Secondary | ICD-10-CM | POA: Diagnosis not present

## 2022-07-29 DIAGNOSIS — L089 Local infection of the skin and subcutaneous tissue, unspecified: Secondary | ICD-10-CM

## 2022-07-29 DIAGNOSIS — L0231 Cutaneous abscess of buttock: Secondary | ICD-10-CM

## 2022-07-29 DIAGNOSIS — L0291 Cutaneous abscess, unspecified: Secondary | ICD-10-CM | POA: Diagnosis not present

## 2022-07-29 MED ORDER — AMOXICILLIN-POT CLAVULANATE 875-125 MG PO TABS: 1.0000 | ORAL_TABLET | Freq: Two times a day (BID) | ORAL | 0 refills | Status: AC

## 2022-07-29 MED ORDER — AMOXICILLIN-POT CLAVULANATE 875-125 MG PO TABS: 1.0000 | ORAL_TABLET | Freq: Two times a day (BID) | ORAL | 0 refills | Status: DC

## 2022-07-29 NOTE — Patient Instructions (Signed)
Sitz baths are good for helping soften the area and getting blood flowing.  Ok to continue with neosporin and the bandages daily.   Call with worsening pain, swelling, drainage.  Take Augmentin as prescribed.   Epidermoid Cyst  An epidermoid cyst, also known as epidermal cyst, is a sac made of skin tissue. The sac contains a substance called keratin. Keratin is a protein that is normally secreted through the hair follicles. When keratin becomes trapped in the top layer of skin (epidermis), it can form an epidermoid cyst. Epidermoid cysts can be found anywhere on your body. These cysts are usually harmless (benign), and they may not cause symptoms unless they become inflamed or infected. What are the causes? This condition may be caused by: A blocked hair follicle. A hair that curls and re-enters the skin instead of growing straight out of the skin (ingrown hair). A blocked pore. Irritated skin. An injury to the skin. Certain conditions that are passed along from parent to child (inherited). Human papillomavirus (HPV). This happens rarely when cysts occur on the bottom of the feet. Long-term (chronic) sun damage to the skin. What increases the risk? The following factors may make you more likely to develop an epidermoid cyst: Having acne. Being male. Having an injury to the skin. Being past puberty. Having certain rare genetic disorders. What are the signs or symptoms? The only symptom of this condition may be a small, painless lump underneath the skin. When an epidermal cyst ruptures, it may become inflamed. True infection in cysts is rare. Symptoms may include: Redness. Inflammation. Tenderness. Warmth. Keratin draining from the cyst. Keratin is grayish-white, bad-smelling substance. Pus draining from the cyst. How is this diagnosed? This condition is diagnosed with a physical exam. In some cases, you may have a sample of tissue (biopsy) taken from your cyst to be examined under  a microscope or tested for bacteria. You may be referred to a health care provider who specializes in skin care (dermatologist). How is this treated? If a cyst becomes inflamed, treatment may include: Opening and draining the cyst, done by a health care provider. After draining, minor surgery to remove the rest of the cyst may be done. Taking antibiotic medicine. Having injections of medicines (steroids) that help to reduce inflammation. Having surgery to remove the cyst. Surgery may be done if the cyst: Becomes large. Bothers you. Has a chance of turning into cancer. Do not try to open a cyst yourself. Follow these instructions at home: Medicines If you were prescribed an antibiotic medicine, take it it as told by your health care provider. Do not stop using the antibiotic even if you start to feel better. Take over-the-counter and prescription medicines only as told by your health care provider. General instructions Keep the area around your cyst clean and dry. Wear loose, dry clothing. Avoid touching your cyst. Check your cyst every day for signs of infection. Check for: Redness, swelling, or pain. Fluid or blood. Warmth. Pus or a bad smell. Keep all follow-up visits. This is important. How is this prevented? Wear clean, dry, clothing. Avoid wearing tight clothing. Keep your skin clean and dry. Take showers or baths every day. Contact a health care provider if: Your cyst develops symptoms of infection. Your condition is not improving or is getting worse. You develop a cyst that looks different from other cysts you have had. You have a fever. Get help right away if: Redness spreads from the cyst into the surrounding area. Summary An epidermoid  cyst is a sac made of skin tissue. These cysts are usually harmless (benign), and they may not cause symptoms unless they become inflamed. If a cyst becomes inflamed, treatment may include surgery to open and drain the cyst, or to  remove it. Treatment may also include medicines by mouth or through an injection. Take over-the-counter and prescription medicines only as told by your health care provider. If you were prescribed an antibiotic medicine, take it as told by your health care provider. Do not stop using the antibiotic even if you start to feel better. Contact a health care provider if your condition is not improving or is getting worse. Keep all follow-up visits as told by your health care provider. This is important. This information is not intended to replace advice given to you by your health care provider. Make sure you discuss any questions you have with your health care provider. Document Revised: 11/16/2019 Document Reviewed: 11/16/2019 Elsevier Patient Education  [place].

## 2022-07-29 NOTE — Progress Notes (Unsigned)
[facility] History and Physical  Reason for Referral:*** Referring Physician: ***  Chief Complaint   New Patient (Initial Visit)     Bob Ross is a 56 y.o. male.  HPI: ***.  The *** started *** and has had a duration of ***.  It is associated with ***.  The *** is improved with ***, and is made worse with ***.    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 Burt Knack & Dr. Cyndia Bent   Severe aortic stenosis     Past Surgical History:  Procedure Laterality Date   COLON SURGERY     COLOSTOMY     COLOSTOMY REVERSAL     HERNIA REPAIR     INTRAOPERATIVE TRANSTHORACIC ECHOCARDIOGRAM N/A 04/17/2022   Procedure: INTRAOPERATIVE TRANSTHORACIC ECHOCARDIOGRAM;  Surgeon: Sherren Mocha, MD;  Location: [place];  Service: Open Heart Surgery;  Laterality: N/A;   LEFT HEART CATH AND CORONARY ANGIOGRAPHY N/A 04/09/2022   Procedure: LEFT HEART CATH AND CORONARY ANGIOGRAPHY;  Surgeon: Early Osmond, MD;  Location: [place] CV LAB;  Service: Cardiovascular;  Laterality: N/A;   LIVER RESECTION     TRANSCATHETER AORTIC VALVE REPLACEMENT, TRANSFEMORAL N/A 04/17/2022   Procedure: Transcatheter Aortic Valve Replacement, Transfemoral;  Surgeon: Sherren Mocha, MD;  Location: [place];  Service: Open Heart Surgery;  Laterality: N/A;    Family History  Problem Relation Age of Onset   AAA (abdominal aortic aneurysm) Mother    Cancer Father     Social History   Tobacco Use   Smoking status: Every Day     Packs/day: 1.50    Years: 45.00    Total pack years: 67.50    Types: Cigarettes   Smokeless tobacco: Never  Substance Use Topics   Alcohol use: Yes    Comment: Occasional   Drug use: No    Medications: {medication reviewed/display:3041432} Allergies as of 07/29/2022       Reactions   Tiotropium Bromide Monohydrate Anaphylaxis   spiriva        Medication List        Accurate as of July 29, 2022 12:05 PM. If you have any questions, ask your nurse or doctor.          STOP taking these medications    amoxicillin 500 MG tablet Commonly known as: AMOXIL Stopped by: Virl Cagey, MD   doxycycline 100 MG capsule Commonly known as: VIBRAMYCIN Stopped by: Virl Cagey, MD       TAKE these medications    albuterol (2.5 MG/3ML) 0.083% nebulizer solution Commonly known as: PROVENTIL Take 3 mLs (2.5 mg total) by nebulization every 6 (six) hours as needed for wheezing or shortness of breath.   albuterol 108 (90 Base) MCG/ACT inhaler Commonly known as: VENTOLIN HFA Inhale 2 puffs into the lungs every 4 (four) hours as needed for wheezing or shortness of breath.   ALBUTEROL IN Inhale into the lungs as needed.   amoxicillin-clavulanate  875-125 MG tablet Commonly known as: AUGMENTIN Take 1 tablet by mouth 2 (two) times daily. Started by: Virl Cagey, MD   apixaban 5 MG Tabs tablet Commonly known as: ELIQUIS Take 1 tablet (5 mg total) by mouth 2 (two) times daily.   atorvastatin 40 MG tablet Commonly known as: LIPITOR Take 1 tablet (40 mg total) by mouth daily.   B-12 PO Take 1 capsule by mouth daily.   cholecalciferol 25 MCG (1000 UNIT) tablet Commonly known as: VITAMIN D3 Take 1,000 Units by mouth daily.   ibuprofen 800 MG tablet Commonly known as: ADVIL Take 800 mg by mouth daily as needed for headache.   icosapent Ethyl 1 g capsule Commonly known as: VASCEPA Take 2 capsules (2 g total) by mouth 2 (two) times daily.    metoprolol succinate 25 MG 24 hr tablet Commonly known as: Toprol XL Take 0.5 tablets (12.5 mg total) by mouth daily.   naproxen 500 MG tablet Commonly known as: Naprosyn Take 1 tablet (500 mg total) by mouth 2 (two) times daily with a meal.   nicotine 21 mg/24hr patch Commonly known as: NICODERM CQ - dosed in mg/24 hours Place 1 patch (21 mg total) onto the skin daily.   oxyCODONE-acetaminophen 5-325 MG tablet Commonly known as: PERCOCET/ROXICET Take 1 tablet by mouth every 6 (six) hours as needed for severe pain.   Senna S 8.6-50 MG tablet Generic drug: senna-docusate Take 1-2 tablets by mouth See admin instructions. Take 2 tablets in the morning and 1 tablet at night   traMADol 50 MG tablet Commonly known as: ULTRAM Take 1 tablet (50 mg total) by mouth every 6 (six) hours as needed.   Trelegy Ellipta 100-62.5-25 MCG/ACT Aepb Generic drug: Fluticasone-Umeclidin-Vilant Inhale 1 puff into the lungs daily.         ROS:  {Review of Systems:30496}  Blood pressure (!) 151/81, pulse 75, temperature 98 F (36.7 C), temperature source Oral, resp. rate 16, height 1' (0.305 m), weight 216 lb (98 kg), head circumference 71" (180.3 cm), SpO2 94 %. Physical Exam  Results: No results found for this or any previous visit (from the past 48 hour(s)).  No results found.   Assessment & Plan:  Bob Ross is a 56 y.o. male with *** -*** -*** -Follow up ***  Future Appointments  Date Time Provider [place]  08/12/2022 10:30 AM Virl Cagey, MD RS-RS None  09/16/2022  8:15 AM [facility] ECHO 3 [facility]  09/16/2022  9:20 AM Sherren Mocha, MD [facility]  11/18/2022 10:00 AM [facility] LAB [facility] None  11/25/2022 10:30 AM Orson Slick, MD [facility] None  04/15/2023 12:45 PM [facility] ECHO 3 [facility]  04/15/2023  2:00 PM [facility]     All questions were answered  to the satisfaction of the patient and family***.  The risk and benefits of *** were discussed including but not limited to ***.  After careful consideration, Bob Ross has decided to ***.    Virl Cagey 07/29/2022, 12:05 PM

## 2022-07-31 DIAGNOSIS — L089 Local infection of the skin and subcutaneous tissue, unspecified: Secondary | ICD-10-CM | POA: Insufficient documentation

## 2022-08-06 DIAGNOSIS — J449 Chronic obstructive pulmonary disease, unspecified: Secondary | ICD-10-CM | POA: Diagnosis not present

## 2022-08-06 DIAGNOSIS — F17200 Nicotine dependence, unspecified, uncomplicated: Secondary | ICD-10-CM | POA: Diagnosis not present

## 2022-08-06 DIAGNOSIS — E785 Hyperlipidemia, unspecified: Secondary | ICD-10-CM | POA: Diagnosis not present

## 2022-08-06 DIAGNOSIS — I2510 Atherosclerotic heart disease of native coronary artery without angina pectoris: Secondary | ICD-10-CM | POA: Diagnosis not present

## 2022-08-12 ENCOUNTER — Ambulatory Visit (INDEPENDENT_AMBULATORY_CARE_PROVIDER_SITE_OTHER): Payer: BC Managed Care – PPO | Admitting: General Surgery

## 2022-08-12 ENCOUNTER — Encounter: Payer: Self-pay | Admitting: General Surgery

## 2022-08-12 VITALS — BP 139/82 | HR 76 | Temp 97.8°F | Resp 14 | Ht 71.0 in | Wt 218.0 lb

## 2022-08-12 DIAGNOSIS — L089 Local infection of the skin and subcutaneous tissue, unspecified: Secondary | ICD-10-CM

## 2022-08-12 DIAGNOSIS — L723 Sebaceous cyst: Secondary | ICD-10-CM

## 2022-08-12 DIAGNOSIS — L0231 Cutaneous abscess of buttock: Secondary | ICD-10-CM

## 2022-08-12 MED ORDER — AMOXICILLIN-POT CLAVULANATE 875-125 MG PO TABS: 1.0000 | ORAL_TABLET | Freq: Two times a day (BID) | ORAL | 0 refills | Status: AC

## 2022-08-12 NOTE — Progress Notes (Signed)
[facility]  Less drainage and less swelling on the left buttock.  Overall improving. Finished antibiotics.  BP 139/82   Pulse 76   Temp 97.8 F (36.6 C) (Oral)   Resp 14   Ht '5\' 11"'$  (1.803 m)   Wt 218 lb (98.9 kg)   SpO2 93%   BMI 30.40 kg/m  Area of ruptured infected cyst with swelling and induration, continues to have slight erythema on edges  Patient with what I think is an infected cyst on the left buttock. Completed antibiotics. Improving. He will need this excised but he also is on eliquis for a PE related to aortic valve stenosis repair.   Area continues to get better. If it starts to get swollen or more painful or draining more, start the Augmentin and let us know as soon as possible.  With the holidays, I am going to go ahead and send in Augmentin for 7 days so you have it if needed.   Future Appointments  Date Time Provider [place]  09/04/2022  2:30 PM Virl Cagey, MD RS-RS None  09/16/2022  8:15 AM [facility] ECHO 3 [facility]  09/16/2022  9:20 AM Sherren Mocha, MD [facility]  11/18/2022 10:00 AM [facility] LAB [facility] None  11/25/2022 10:30 AM Orson Slick, MD [facility] None  04/15/2023 12:45 PM [facility] [place] ECHO 3 [facility]  04/15/2023  2:00 PM [facility]   Curlene Labrum, MD [facility] [address] [place], [place] [address] [phone] (office)

## 2022-08-12 NOTE — Patient Instructions (Signed)
Area continues to get better. If it starts to get swollen or more painful or draining more, start the Augmentin and let us know as soon as possible.  With the holidays, I am going to go ahead and send in Augmentin for 7 days so you have it if needed.

## 2022-08-13 ENCOUNTER — Telehealth: Payer: Self-pay | Admitting: *Deleted

## 2022-08-13 NOTE — Telephone Encounter (Signed)
Received fax from [facility]~ [phone] telephone/ [phone]~ fax.   Medical records requested: most recent chart notes  Records from [place] faxed to [place] [facility].

## 2022-08-14 NOTE — Telephone Encounter (Signed)
Received confirmation of fax.

## 2022-09-04 ENCOUNTER — Encounter: Payer: Self-pay | Admitting: General Surgery

## 2022-09-04 ENCOUNTER — Ambulatory Visit (INDEPENDENT_AMBULATORY_CARE_PROVIDER_SITE_OTHER): Payer: BC Managed Care – PPO | Admitting: General Surgery

## 2022-09-04 VITALS — BP 147/81 | HR 75 | Temp 98.1°F | Resp 14 | Ht 71.0 in | Wt 226.0 lb

## 2022-09-04 DIAGNOSIS — L309 Dermatitis, unspecified: Secondary | ICD-10-CM | POA: Diagnosis not present

## 2022-09-04 MED ORDER — HYDROCORTISONE 1 % EX CREA: TOPICAL_CREAM | CUTANEOUS | 1 refills | Status: DC

## 2022-09-04 NOTE — Progress Notes (Signed)
[facility]  Doing well. Left buttock infected cyst much improved. No major issues.   Sees cardiology 1/24 and has ECHO. Will see what plan is for his eliquis.  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 Doing better.  Will see what cardiology says about your Eliquis. If you  can come off for 2 days, will to excise the area under local in the office. Discussed risk of bleeding, infection, recurrence, wound opening up/ falling apart due to location and needing packing.  ?Dyshidrotic eczema? - information given and hydrocortisone 1% prescribed  Call when you want the cyst removed.  Curlene Labrum, MD [facility] [address] [place],  [address] [phone] (office)

## 2022-09-04 NOTE — Patient Instructions (Signed)
Try steroid cream on the hand lesions. Possible dyshidrotic eczema?  Follow up with PCP regarding the eczema?   Call once you know if cardiology is ok with you coming of blood thinners to get the cyst removed from the buttock.

## 2022-09-16 ENCOUNTER — Ambulatory Visit (HOSPITAL_BASED_OUTPATIENT_CLINIC_OR_DEPARTMENT_OTHER): Payer: BC Managed Care – PPO

## 2022-09-16 ENCOUNTER — Ambulatory Visit: Payer: BC Managed Care – PPO | Attending: Cardiovascular Disease | Admitting: Cardiovascular Disease

## 2022-09-16 ENCOUNTER — Other Ambulatory Visit (HOSPITAL_COMMUNITY): Payer: BC Managed Care – PPO

## 2022-09-16 ENCOUNTER — Encounter: Payer: Self-pay | Admitting: Cardiovascular Disease

## 2022-09-16 VITALS — BP 140/90 | HR 75 | Ht 71.5 in | Wt 216.4 lb

## 2022-09-16 DIAGNOSIS — I2699 Other pulmonary embolism without acute cor pulmonale: Secondary | ICD-10-CM | POA: Diagnosis not present

## 2022-09-16 DIAGNOSIS — I350 Nonrheumatic aortic (valve) stenosis: Secondary | ICD-10-CM | POA: Diagnosis not present

## 2022-09-16 DIAGNOSIS — K089 Disorder of teeth and supporting structures, unspecified: Secondary | ICD-10-CM | POA: Diagnosis not present

## 2022-09-16 DIAGNOSIS — I10 Essential (primary) hypertension: Secondary | ICD-10-CM | POA: Insufficient documentation

## 2022-09-16 DIAGNOSIS — Z952 Presence of prosthetic heart valve: Secondary | ICD-10-CM | POA: Insufficient documentation

## 2022-09-16 LAB — ECHOCARDIOGRAM COMPLETE
AR max vel: 1.78 cm2
AV Area VTI: 1.88 cm2
AV Area mean vel: 1.92 cm2
AV Mean grad: 11.6 mmHg
AV Peak grad: 22.7 mmHg
Ao pk vel: 2.38 m/s
Area-P 1/2: 2.27 cm2
S' Lateral: 3.3 cm

## 2022-09-16 LAB — BASIC METABOLIC PANEL
BUN/Creatinine Ratio: 15 (ref 9–20)
BUN: 16 mg/dL (ref 6–24)
CO2: 20 mmol/L (ref 20–29)
Calcium: 9.3 mg/dL (ref 8.7–10.2)
Chloride: 108 mmol/L — ABNORMAL HIGH (ref 96–106)
Creatinine, Ser: 1.08 mg/dL (ref 0.76–1.27)
Glucose: 96 mg/dL (ref 70–99)
Potassium: 4.4 mmol/L (ref 3.5–5.2)
Sodium: 143 mmol/L (ref 134–144)
eGFR: 82 mL/min/{1.73_m2} (ref >59)

## 2022-09-16 NOTE — Patient Instructions (Addendum)
Medication Instructions:  NONE *If you need a refill on your cardiac medications before your next appointment, please call your pharmacy*   Lab Work: BMET today If you have labs (blood work) drawn today and your tests are completely normal, you will receive your results only by: [place] (if you have MyChart) OR A paper copy in the mail If you have any lab test that is abnormal or we need to change your treatment, we will call you to review the results.  Testing/Procedures: ECHO (prior to next visit in 3 months) Your physician has requested that you have an echocardiogram. Echocardiography is a painless test that uses sound waves to create images of your heart. It provides your doctor with information about the size and shape of your heart and how well your heart's chambers and valves are working. This procedure takes approximately one hour. There are no restrictions for this procedure. Please do NOT wear cologne, perfume, aftershave, or lotions (deodorant is allowed). Please arrive 15 minutes prior to your appointment time.  Coronary CT Angiogram (prior to next visit in 3 months) Your physician has requested that you have cardiac CT. Cardiac computed tomography (CT) is a painless test that uses an x-ray machine to take clear, detailed pictures of your heart. For further information please visit HugeFiesta.[email or web address]. Please follow instruction sheet as given.  Follow-Up: At [facility], you and your health needs are our priority.  As part of our continuing mission to provide you with exceptional heart care, we have created designated Provider Care Teams.  These Care Teams include your primary Cardiologist (physician) and Advanced Practice Providers (APPs -  Physician Assistants and Nurse Practitioners) who all work together to provide you with the care you need, when you need it.  Your next appointment:   3 month(s)  Provider:   Sherren Mocha, MD      Other  Instructions   Your cardiac CT will be scheduled at:   [facility] [address] [place], [place] [address] [phone]   Please arrive at the [facility] and Children's Entrance (Entrance C2) of [facility] 30 minutes prior to test start time. You can use the FREE valet parking offered at entrance C (encouraged to control the heart rate for the test)  Proceed to the [facility] Radiology Department (first floor) to check-in and test prep.  All radiology patients and guests should use entrance C2 at [facility], accessed from [facility], even though the hospital's physical address listed is [address]      Please follow these instructions carefully (unless otherwise directed):  Hold all erectile dysfunction medications at least 3 days (72 hrs) prior to test. (Ie viagra, cialis, sildenafil, tadalafil, etc) We will administer nitroglycerin during this exam.   On the Night Before the Test: Be sure to Drink plenty of water. Do not consume any caffeinated/decaffeinated beverages or chocolate 12 hours prior to your test. Do not take any antihistamines 12 hours prior to your test.  On the Day of the Test: Drink plenty of water until 1 hour prior to the test. Do not eat any food 1 hour prior to test. You may take your regular medications prior to the test.  Take metoprolol (whole tablet) two hours prior to test.    After the Test: Drink plenty of water. After receiving IV contrast, you may experience a mild flushed feeling. This is normal. On occasion, you may experience a mild rash up to  24 hours after the test. This is not dangerous. If this occurs, you can take Benadryl 25 mg and increase your fluid intake. If you experience trouble breathing, this can be serious. If it is severe call 911 IMMEDIATELY. If it is mild, please call our office.  We will call to schedule your test 2-4 weeks out understanding that some insurance  companies will need an authorization prior to the service being performed.   For non-scheduling related questions, please contact the cardiac imaging nurse navigator should you have any questions/concerns: Marchia Bond, Cardiac Imaging Nurse Navigator Gordy Clement, Cardiac Imaging Nurse Navigator [facility] Direct Office Dial: [phone]   For scheduling needs, including cancellations and rescheduling, please call [place], [phone].

## 2022-09-16 NOTE — Progress Notes (Signed)
Cardiology Office Note:    Date:  09/16/2022   ID:  Bob Ross, DOB 01/04/1966, MRN [id number]  PCP:  Coolidge Breeze, [place] Prov[facility] Cardiologist:  Sherren Mocha, MD     Referring MD: Coolidge Breeze, FNP   Chief Complaint  Patient presents with   Shortness of Breath    History of Present Illness:    Bob Ross is a 57 y.o. male with a hx of remote metastatic colon cancer treated with radical surgery and chemotherapy with history of multiple surgeries.  Has a single functioning kidney, peripheral arterial disease, and was found to have severe aortic stenosis treated with TAVR in August 2023.  The patient was found to have 3 transaortic valve gradient that is a follow-up echocardiogram when his knee gradient went from 12 to 23 mmHg.  The CTA demonstrated findings consistent with subacute leaflet thrombosis.  He was started on apixaban at that time.  He was also incidentally found to have acute pulmonary embolism.  He is here with his wife today.  He denies chest pain or pressure.  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  He complains of bleeding and bruising easily since he has been taking apixaban.  He denies orthopnea, PND, or leg swelling.  He has no other complaints at this time.  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 Burt Knack & Dr. Cyndia Bent   Severe aortic stenosis     Past Surgical History:  Procedure Laterality Date   COLON SURGERY      COLOSTOMY     COLOSTOMY REVERSAL     HERNIA REPAIR     INTRAOPERATIVE TRANSTHORACIC ECHOCARDIOGRAM N/A 04/17/2022   Procedure: INTRAOPERATIVE TRANSTHORACIC ECHOCARDIOGRAM;  Surgeon: Sherren Mocha, MD;  Location: [facility];  Service: Open Heart Surgery;  Laterality: N/A;   LEFT HEART CATH AND CORONARY ANGIOGRAPHY N/A 04/09/2022   Procedure: LEFT HEART CATH AND CORONARY ANGIOGRAPHY;  Surgeon: Early [place], MD;  Location: [facility];  Service: Cardiovascular;  Laterality: N/A;   LIVER RESECTION     TRANSCATHETER AORTIC VALVE REPLACEMENT, TRANSFEMORAL N/A 04/17/2022   Procedure: Transcatheter Aortic Valve Replacement, Transfemoral;  Surgeon: Sherren Mocha, MD;  Location: [facility];  Service: Open Heart Surgery;  Laterality: N/A;    Current Medications: Current Meds  Medication Sig   albuterol (VENTOLIN HFA) 108 (90 Base) MCG/ACT inhaler Inhale 2 puffs into the lungs every 4 (four) hours as needed for wheezing or shortness of breath.   apixaban (ELIQUIS) 5 MG TABS tablet Take 1 tablet (5 mg total) by mouth 2 (two) times daily.   aspirin-acetaminophen-caffeine (EXCEDRIN MIGRAINE) 250-250-65 MG tablet Take by mouth as needed for headache.   atorvastatin (LIPITOR) 40 MG tablet Take 1 tablet (40 mg total) by mouth daily.   cholecalciferol (VITAMIN D3) 25 MCG (1000 UNIT) tablet Take 1,000 Units by mouth daily.   Cyanocobalamin (B-12 PO) Take  1 capsule by mouth daily.   Fluticasone-Umeclidin-Vilant (TRELEGY ELLIPTA) 100-62.5-25 MCG/ACT AEPB Inhale 1 puff into the lungs daily.   ibuprofen (ADVIL) 800 MG tablet Take 800 mg by mouth daily as needed for headache.   icosapent Ethyl (VASCEPA) 1 g capsule Take 2 capsules (2 g total) by mouth 2 (two) times daily.   metoprolol succinate (TOPROL XL) 25 MG 24 hr tablet Take 0.5 tablets (12.5 mg total) by mouth daily.   senna-docusate (SENNA S) 8.6-50 MG tablet Take 1-2 tablets by mouth See admin instructions. Take 2 tablets in the morning and 2 tablet at  night   traMADol (ULTRAM) 50 MG tablet Take 1 tablet (50 mg total) by mouth every 6 (six) hours as needed.     Allergies:   Tiotropium bromide monohydrate   Social History   Socioeconomic History   Marital status: Married    Spouse name: Not on file   Number of children: Not on file   Years of education: Not on file   Highest education level: Not on file  Occupational History   Not on file  Tobacco Use   Smoking status: Every Day    Packs/day: 1.50    Years: 45.00    Total pack years: 67.50    Types: Cigarettes   Smokeless tobacco: Never  Substance and Sexual Activity   Alcohol use: Yes    Comment: Occasional   Drug use: No   Sexual activity: Not on file  Other Topics Concern   Not on file  Social History Narrative   Not on file   Social Determinants of Health   Financial Resource Strain: Not on file  Food Insecurity: Not on file  Transportation Needs: Not on file  Physical Activity: Not on file  Stress: Not on file  Social Connections: Not on file     Family History: The patient's family history includes AAA (abdominal aortic aneurysm) in his mother; Cancer in his father.  ROS:   Please see the history of present illness.    All other systems reviewed and are negative.  EKGs/Labs/Other Studies Reviewed:    The following studies were reviewed today: CTA Cardiac 06/02/22: IMPRESSION: 1. 26 mm S3 with severe HALT/HAM consistent with leaflet thrombosis.   2. 3-vessel coronary calcifications.  Echo 05/21/2022:  1. Left ventricular ejection fraction, by estimation, is 50 to 55%. The  left ventricle has low normal function. The left ventricle has no regional  wall motion abnormalities. There is mild left ventricular hypertrophy.  Left ventricular diastolic  parameters are consistent with Grade I diastolic dysfunction (impaired  relaxation).   2. Right ventricular systolic function is normal. The right ventricular  size is normal. Tricuspid regurgitation  signal is inadequate for assessing  PA pressure.   3. The mitral valve is grossly normal. Trivial mitral valve  regurgitation.   4. Aortic valve prosthetic gradient has increased. 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 Consider CT  to evaluate for valve dysfunction and leaflet thrombus. The aortic valve  has been repaired/replaced.  Aortic valve regurgitation is not visualized. There is a 26 mm Sapien  prosthetic (TAVR) valve present in the aortic position. Procedure Date:  04/17/2022. Aortic valve mean gradient measures 22.9 mmHg.   5. The inferior vena cava is normal in size with greater than 50%  respiratory variability, suggesting right atrial pressure of 3 mmHg.   EKG:  EKG is not ordered today.    Recent  Labs: 04/18/2022: Magnesium 1.9 06/25/2022: ALT 17 07/20/2022: BUN 16; Creatinine, Ser 1.24; Hemoglobin 16.1; Platelets 96; Potassium 4.2; Sodium 138  Recent Lipid Panel    Component Value Date/Time   CHOL 126 04/10/2022 0142   TRIG 309 (H) 04/10/2022 0142   HDL 36 (L) 04/10/2022 0142   CHOLHDL 3.5 04/10/2022 0142   VLDL 62 (H) 04/10/2022 0142   LDLCALC 28 04/10/2022 0142     Risk Assessment/Calculations:             Physical Exam:    VS:  BP (!) 140/90   Pulse 75   Ht 5' 11.5" (1.816 m)   Wt 216 lb 6.4 oz (98.2 kg)   SpO2 97%   BMI 29.76 kg/m     Wt Readings from Last 3 Encounters:  09/16/22 216 lb 6.4 oz (98.2 kg)  09/04/22 226 lb (102.5 kg)  08/12/22 218 lb (98.9 kg)     GEN:  Well nourished, well developed in no acute distress HEENT: Normal except for poor dentition NECK: No JVD; No carotid bruits LYMPHATICS: No lymphadenopathy CARDIAC: RRR, 2/6 early peaking ejection murmur at the right upper sternal border RESPIRATORY:  Clear to auscultation without rales, wheezing or rhonchi  ABDOMEN: Soft, non-tender, non-distended MUSCULOSKELETAL:  No edema; No deformity  SKIN: Warm and dry NEUROLOGIC:  Alert  and oriented x 3 PSYCHIATRIC:  Normal affect   ASSESSMENT:    1. Nonrheumatic aortic valve stenosis   2. Other pulmonary embolism without acute cor pulmonale, unspecified chronicity ([place])   3. Poor dentition   4. Essential hypertension    PLAN:    In order of problems listed above:  I personally reviewed the patient's echo images from this morning.  The formal interpretation is pending.  He appears to have grossly normal left ventricular systolic function.  The mean gradient across the aortic valve is back down to 12 mmHg.  I reviewed all of his echo studies, initially with a mean gradient of 49 mmHg at baseline.  His postoperative day 1 echo showed a mean gradient of 12 mmHg, increasing to 23 mmHg at his 1 month follow-up, now back down to 12 mmHg after 3 months of apixaban for treatment of acute leaflet thrombosis.  We discussed uncertainty around anticoagulation duration in this situation.  Since he was incidentally found to have pulmonary embolus 3 months ago, I would favor continuing his apixaban for another 3 months, checking a follow-up echocardiogram and a gated coronary CTA to evaluate for findings of leaflet thrombosis.  If things are stable at that time, I would anticipate putting him back for aspirin. As above, continue apixaban for another 3 months. It would be reasonable to proceed with dental extraction when needed.  I think the patient is at low risk of a short interruption in his anticoagulation.  He has had 3 months of oral anticoagulation since his diagnosis of pulmonary embolism.  He did not have massive or saddle pulmonary emboli.  He is clinically stable at this time. Blood pressure treated with metoprolol succinate.  Patient anxious today to review findings of his echo cardiogram.  Continue current medication.           Medication Adjustments/Labs and Tests Ordered: Current medicines are reviewed at length with the patient today.  Concerns regarding medicines are  outlined above.  Orders Placed This Encounter  Procedures   CT CORONARY MORPH W/CTA COR W/SCORE W/CA W/CM &/OR WO/CM   Basic metabolic panel   ECHOCARDIOGRAM COMPLETE  No orders of the defined types were placed in this encounter.   Patient Instructions  Medication Instructions:  NONE *If you need a refill on your cardiac medications before your next appointment, please call your pharmacy*   Lab Work: BMET today If you have labs (blood work) drawn today and your tests are completely normal, you will receive your results only by: [place] (if you have MyChart) OR A paper copy in the mail If you have any lab test that is abnormal or we need to change your treatment, we will call you to review the results.  Testing/Procedures: ECHO (prior to next visit in 3 months) Your physician has requested that you have an echocardiogram. Echocardiography is a painless test that uses sound waves to create images of your heart. It provides your doctor with information about the size and shape of your heart and how well your heart's chambers and valves are working. This procedure takes approximately one hour. There are no restrictions for this procedure. Please do NOT wear cologne, perfume, aftershave, or lotions (deodorant is allowed). Please arrive 15 minutes prior to your appointment time.  Coronary CT Angiogram (prior to next visit in 3 months) Your physician has requested that you have cardiac CT. Cardiac computed tomography (CT) is a painless test that uses an x-ray machine to take clear, detailed pictures of your heart. For further information please visit HugeFiesta.[email or web address]. Please follow instruction sheet as given.  Follow-Up: At [facility], you and your health needs are our priority.  As part of our continuing mission to provide you with exceptional heart care, we have created designated Provider Care Teams.  These Care Teams include your primary Cardiologist (physician)  and Advanced Practice Providers (APPs -  Physician Assistants and Nurse Practitioners) who all work together to provide you with the care you need, when you need it.  Your next appointment:   3 month(s)  Provider:   Sherren Mocha, MD      Other Instructions   Your cardiac CT will be scheduled at:   [facility] [address] [place], [place] [address] [phone]   Please arrive at the [facility] and Children's Entrance (Entrance C2) of [facility] 30 minutes prior to test start time. You can use the FREE valet parking offered at entrance C (encouraged to control the heart rate for the test)  Proceed to the [facility] Radiology Department (first floor) to check-in and test prep.  All radiology patients and guests should use entrance C2 at [facility], accessed from [facility], even though the hospital's physical address listed is [address].      Please follow these instructions carefully (unless otherwise directed):  Hold all erectile dysfunction medications at least 3 days (72 hrs) prior to test. (Ie viagra, cialis, sildenafil, tadalafil, etc) We will administer nitroglycerin during this exam.   On the Night Before the Test: Be sure to Drink plenty of water. Do not consume any caffeinated/decaffeinated beverages or chocolate 12 hours prior to your test. Do not take any antihistamines 12 hours prior to your test.  On the Day of the Test: Drink plenty of water until 1 hour prior to the test. Do not eat any food 1 hour prior to test. You may take your regular medications prior to the test.  Take metoprolol (whole tablet) two hours prior to test.    After the Test: Drink plenty of water. After receiving IV contrast, you may experience a  mild flushed feeling. This is normal. On occasion, you may experience a mild rash up to 24 hours after the test. This is not dangerous. If this occurs, you can take Benadryl 25 mg and  increase your fluid intake. If you experience trouble breathing, this can be serious. If it is severe call 911 IMMEDIATELY. If it is mild, please call our office.  We will call to schedule your test 2-4 weeks out understanding that some insurance companies will need an authorization prior to the service being performed.   For non-scheduling related questions, please contact the cardiac imaging nurse navigator should you have any questions/concerns: Marchia Bond, Cardiac Imaging Nurse Navigator Gordy Clement, Cardiac Imaging Nurse Navigator [place] [facility] Direct Office Dial: [phone]   For scheduling needs, including cancellations and rescheduling, please call [place], [phone].    Signed, Sherren Mocha, MD  09/16/2022 12:42 PM    [place]

## 2022-09-18 ENCOUNTER — Telehealth: Payer: Self-pay | Admitting: Hematology and Oncology

## 2022-09-18 NOTE — Telephone Encounter (Signed)
Patient called to r/s MD visit due to work schedule conflict. Patient r/s and notified.

## 2022-09-19 ENCOUNTER — Telehealth (HOSPITAL_COMMUNITY): Payer: Self-pay

## 2022-09-30 ENCOUNTER — Telehealth (HOSPITAL_COMMUNITY): Payer: Self-pay | Admitting: *Deleted

## 2022-09-30 NOTE — Telephone Encounter (Signed)
Attempted to call patient regarding upcoming cardiac CT appointment. °Left message on voicemail with name and callback number ° °Teretha Chalupa RN Navigator Cardiac Imaging °[place] °[phone] Office °[phone] Cell ° °

## 2022-10-01 ENCOUNTER — Ambulatory Visit (HOSPITAL_COMMUNITY)
Admission: RE | Admit: 2022-10-01 | Discharge: 2022-10-01 | Disposition: A | Discharge status: Home / Self Care | Payer: BC Managed Care – PPO | Source: Ambulatory Visit | Attending: Cardiovascular Disease | Admitting: Cardiovascular Disease

## 2022-10-01 DIAGNOSIS — I10 Essential (primary) hypertension: Secondary | ICD-10-CM | POA: Insufficient documentation

## 2022-10-01 DIAGNOSIS — I2699 Other pulmonary embolism without acute cor pulmonale: Secondary | ICD-10-CM | POA: Insufficient documentation

## 2022-10-01 DIAGNOSIS — I350 Nonrheumatic aortic (valve) stenosis: Secondary | ICD-10-CM | POA: Insufficient documentation

## 2022-10-01 DIAGNOSIS — K089 Disorder of teeth and supporting structures, unspecified: Secondary | ICD-10-CM | POA: Insufficient documentation

## 2022-10-01 MED ORDER — IOHEXOL 350 MG/ML SOLN
100.0000 mL | Freq: Once | INTRAVENOUS | Status: AC | PRN
  Administered 2022-10-01: 100 mL via INTRAVENOUS

## 2022-10-23 ENCOUNTER — Encounter: Payer: Self-pay | Admitting: Radiology

## 2022-11-18 ENCOUNTER — Inpatient Hospital Stay: Payer: BC Managed Care – PPO | Attending: Hematology and Oncology

## 2022-11-18 ENCOUNTER — Other Ambulatory Visit: Payer: Self-pay

## 2022-11-18 DIAGNOSIS — D696 Thrombocytopenia, unspecified: Secondary | ICD-10-CM | POA: Diagnosis not present

## 2022-11-18 DIAGNOSIS — I2699 Other pulmonary embolism without acute cor pulmonale: Secondary | ICD-10-CM | POA: Insufficient documentation

## 2022-11-18 LAB — CBC WITH DIFFERENTIAL (CANCER CENTER ONLY)
Abs Immature Granulocytes: 0 10*3/uL (ref 0.00–0.07)
Basophils Absolute: 0 10*3/uL (ref 0.0–0.1)
Basophils Relative: 0 %
Eosinophils Absolute: 0.1 10*3/uL (ref 0.0–0.5)
Eosinophils Relative: 2 %
HCT: 53.1 % — ABNORMAL HIGH (ref 39.0–52.0)
Hemoglobin: 17.6 g/dL — ABNORMAL HIGH (ref 13.0–17.0)
Immature Granulocytes: 0 %
Lymphocytes Relative: 36 %
Lymphs Abs: 2.5 10*3/uL (ref 0.7–4.0)
MCH: 30.1 pg (ref 26.0–34.0)
MCHC: 33.1 g/dL (ref 30.0–36.0)
MCV: 90.9 fL (ref 80.0–100.0)
Monocytes Absolute: 0.6 10*3/uL (ref 0.1–1.0)
Monocytes Relative: 9 %
Neutro Abs: 3.8 10*3/uL (ref 1.7–7.7)
Neutrophils Relative %: 53 %
Platelet Count: 74 10*3/uL — ABNORMAL LOW (ref 150–400)
RBC: 5.84 MIL/uL — ABNORMAL HIGH (ref 4.22–5.81)
RDW: 14.9 % (ref 11.5–15.5)
WBC Count: 7 10*3/uL (ref 4.0–10.5)
nRBC: 0 % (ref 0.0–0.2)

## 2022-11-18 LAB — VITAMIN B12: Vitamin B-12: 1392 pg/mL — ABNORMAL HIGH (ref 180–914)

## 2022-11-18 LAB — CMP (CANCER CENTER ONLY)
ALT: 21 U/L (ref 0–44)
AST: 25 U/L (ref 15–41)
Albumin: 4.2 g/dL (ref 3.5–5.0)
Alkaline Phosphatase: 88 U/L (ref 38–126)
Anion gap: 4 — ABNORMAL LOW (ref 5–15)
BUN: 12 mg/dL (ref 6–20)
CO2: 30 mmol/L (ref 22–32)
Calcium: 9.7 mg/dL (ref 8.9–10.3)
Chloride: 107 mmol/L (ref 98–111)
Creatinine: 1.17 mg/dL (ref 0.61–1.24)
GFR, Estimated: >60 mL/min (ref >60)
Glucose, Bld: 74 mg/dL (ref 70–99)
Potassium: 4.3 mmol/L (ref 3.5–5.1)
Sodium: 141 mmol/L (ref 135–145)
Total Bilirubin: 0.9 mg/dL (ref 0.3–1.2)
Total Protein: 7.3 g/dL (ref 6.5–8.1)

## 2022-11-18 LAB — HEPATITIS C ANTIBODY: HCV Ab: NONREACTIVE

## 2022-11-18 LAB — FOLATE: Folate: 6.6 ng/mL (ref >5.9)

## 2022-11-18 LAB — HEPATITIS B SURFACE ANTIBODY,QUALITATIVE: Hep B S Ab: NONREACTIVE

## 2022-11-18 LAB — HEPATITIS B SURFACE ANTIGEN: Hepatitis B Surface Ag: NONREACTIVE

## 2022-11-18 LAB — IMMATURE PLATELET FRACTION: Immature Platelet Fraction: 7.5 % (ref 1.2–8.6)

## 2022-11-18 LAB — HEPATITIS B CORE ANTIBODY, TOTAL: Hep B Core Total Ab: NONREACTIVE

## 2022-11-21 ENCOUNTER — Other Ambulatory Visit: Payer: Self-pay | Admitting: Physician Assistant

## 2022-11-21 NOTE — Telephone Encounter (Signed)
Prescription refill request for Eliquis received. Indication: Pe  Last office visit: Burt Knack 09/16/2022 Scr: 1.17, 11/18/2022 Age: 57 yo  Weight: 98.2 kg   Refill sent.

## 2022-11-24 LAB — METHYLMALONIC ACID, SERUM: Methylmalonic Acid, Quantitative: 195 nmol/L (ref 0–378)

## 2022-11-25 ENCOUNTER — Other Ambulatory Visit: Payer: BC Managed Care – PPO

## 2022-11-25 ENCOUNTER — Ambulatory Visit: Payer: BC Managed Care – PPO | Admitting: Hematology and Oncology

## 2022-11-26 ENCOUNTER — Other Ambulatory Visit: Payer: Self-pay

## 2022-11-26 ENCOUNTER — Inpatient Hospital Stay: Payer: BC Managed Care – PPO | Attending: Physician Assistant | Admitting: Hematology and Oncology

## 2022-11-26 VITALS — BP 127/74 | HR 69 | Temp 97.6°F | Resp 18 | Wt 214.1 lb

## 2022-11-26 DIAGNOSIS — I2699 Other pulmonary embolism without acute cor pulmonale: Secondary | ICD-10-CM | POA: Insufficient documentation

## 2022-11-26 DIAGNOSIS — Z809 Family history of malignant neoplasm, unspecified: Secondary | ICD-10-CM | POA: Insufficient documentation

## 2022-11-26 DIAGNOSIS — Z85038 Personal history of other malignant neoplasm of large intestine: Secondary | ICD-10-CM | POA: Insufficient documentation

## 2022-11-26 DIAGNOSIS — Z7901 Long term (current) use of anticoagulants: Secondary | ICD-10-CM | POA: Insufficient documentation

## 2022-11-26 DIAGNOSIS — F17210 Nicotine dependence, cigarettes, uncomplicated: Secondary | ICD-10-CM | POA: Insufficient documentation

## 2022-11-26 DIAGNOSIS — D696 Thrombocytopenia, unspecified: Secondary | ICD-10-CM | POA: Diagnosis not present

## 2022-11-26 NOTE — Progress Notes (Signed)
[place] [phone]   Fax:([phone]) [phone]  PROGRESS NOTE  Patient Care Team: Coolidge Breeze, FNP as PCP - General (Family Medicine) Sherren Mocha, MD as PCP - Cardiology (Cardiology)  Hematological/Oncological History # Acute Pulmonary Embolism 04/17/2022: underwent TAVR after presenting with an NSTEMI and work-up revealed severe aortic stenosis.  06/02/2022: underwent cardiac CT did reveal a leaflet thrombosis along with an acute pulmonary embolus of the distal right pulmonary artery with extension into the right upper lobe lobar artery.  Patient was initiated on Eliquis therapy  06/25/2022: Establish care with [facility] hematology.  Interval History:  Bob Ross 57 y.o. male with medical history significant for pulmonary embolism/thrombocytopenia who presents for a follow up visit. The patient's last visit was on 06/25/2022. In the interim since the last visit he has continued on Eliquis therapy.  On exam today Mr. Lamora is accompanied by his wife.  He reports has been taking his Eliquis 5 mg twice daily faithfully without any difficulty.  He does have some easy bruising on his arms but no overt signs of bleeding, dark stools, or blood in the urine/stool.  He reports that he does not have any difficulty with the cost of the medication as he got his last dose "free".  He reports he does not have any signs or symptoms of recurrent VTE such as chest pain, shortness of breath, or lower extremity swelling/pain.  He reports that he did have liver cancer approximately 20 years ago that took out one third of his liver.  He reports that his "colon burst the mets to the liver".  He notes that he does have a large ventral hernia as result of the surgeries from this episode.  He notes that he feels well and denies any fevers, chills, sweats, nausea, vomiting or diarrhea.  A full 10 point ROS is otherwise negative.  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 Burt Knack & Dr. Cyndia Bent   Severe aortic stenosis     SURGICAL HISTORY: Past Surgical History:  Procedure Laterality Date   COLON SURGERY     COLOSTOMY     COLOSTOMY REVERSAL     HERNIA REPAIR     INTRAOPERATIVE TRANSTHORACIC ECHOCARDIOGRAM N/A 04/17/2022   Procedure: INTRAOPERATIVE TRANSTHORACIC ECHOCARDIOGRAM;  Surgeon: Sherren Mocha, MD;  Location: [place];  Service: Open Heart Surgery;  Laterality: N/A;   LEFT HEART CATH AND CORONARY ANGIOGRAPHY N/A 04/09/2022   Procedure: LEFT HEART CATH AND CORONARY ANGIOGRAPHY;  Surgeon: Early Osmond, MD;  Location: [place] [facility];  Service: Cardiovascular;  Laterality: N/A;   LIVER RESECTION     TRANSCATHETER AORTIC VALVE REPLACEMENT, TRANSFEMORAL N/A 04/17/2022   Procedure: Transcatheter Aortic Valve Replacement, Transfemoral;  Surgeon: Sherren Mocha, MD;  Location: [place];  Service: Open Heart Surgery;  Laterality: N/A;    SOCIAL HISTORY: Social History   Socioeconomic History   Marital status: Married    Spouse name: Not on  file   Number of children: Not on file   Years of education: Not on file   Highest education level: Not on file  Occupational History   Not on file  Tobacco Use   Smoking status: Every Day    Packs/day: 1.50    Years: 45.00    Additional pack years: 0.00    Total pack years: 67.50    Types: Cigarettes   Smokeless tobacco: Never  Substance and Sexual Activity   Alcohol use: Yes    Comment: Occasional   Drug use: No   Sexual activity: Not on  file  Other Topics Concern   Not on file  Social History Narrative   Not on file   Social Determinants of Health   Financial Resource Strain: Not on file  Food Insecurity: Not on file  Transportation Needs: Not on file  Physical Activity: Not on file  Stress: Not on file  Social Connections: Not on file  Intimate Partner Violence: Not on file    FAMILY HISTORY: Family History  Problem Relation Age of Onset   AAA (abdominal aortic aneurysm) Mother    Cancer Father     ALLERGIES:  is allergic to tiotropium bromide monohydrate.  MEDICATIONS:  Current Outpatient Medications  Medication Sig Dispense Refill   albuterol (VENTOLIN HFA) 108 (90 Base) MCG/ACT inhaler Inhale 2 puffs into the lungs every 4 (four) hours as needed for wheezing or shortness of breath. 1 each 3   aspirin-acetaminophen-caffeine (EXCEDRIN MIGRAINE) 250-250-65 MG tablet Take by mouth as needed for headache.     atorvastatin (LIPITOR) 40 MG tablet Take 1 tablet (40 mg total) by mouth daily. 90 tablet 3   cholecalciferol (VITAMIN D3) 25 MCG (1000 UNIT) tablet Take 1,000 Units by mouth daily.     Cyanocobalamin (B-12 PO) Take 1 capsule by mouth daily.     ELIQUIS 5 MG TABS tablet Take 1 tablet by mouth twice daily 180 tablet 0   Fluticasone-Umeclidin-Vilant (TRELEGY ELLIPTA) 100-62.5-25 MCG/ACT AEPB Inhale 1 puff into the lungs daily.     ibuprofen (ADVIL) 800 MG tablet Take 800 mg by mouth daily as needed for headache.     icosapent Ethyl (VASCEPA) 1 g capsule Take 2 capsules (2 g total) by mouth 2 (two) times daily. 120 capsule 3   metoprolol succinate (TOPROL XL) 25 MG 24 hr tablet Take 0.5 tablets (12.5 mg total) by mouth daily. 90 tablet 3   senna-docusate (SENNA S) 8.6-50 MG tablet Take 1-2 tablets by mouth See admin instructions. Take 2 tablets in the morning and 2 tablet at night     traMADol (ULTRAM) 50 MG tablet Take 1 tablet (50 mg total) by mouth every 6 (six) hours as needed. 15 tablet 0   No current  facility-administered medications for this visit.    REVIEW OF SYSTEMS:   Constitutional: ( - ) fevers, ( - )  chills , ( - ) night sweats Eyes: ( - ) blurriness of vision, ( - ) double vision, ( - ) watery eyes Ears, nose, mouth, throat, and face: ( - ) mucositis, ( - ) sore throat Respiratory: ( - ) cough, ( - ) dyspnea, ( - ) wheezes Cardiovascular: ( - ) palpitation, ( - ) chest discomfort, ( - ) lower extremity swelling Gastrointestinal:  ( - ) nausea, ( - ) heartburn, ( - ) change in bowel habits Skin: ( - ) abnormal skin rashes Lymphatics: ( - ) new lymphadenopathy, ( - ) easy bruising  Neurological: ( - ) numbness, ( - ) tingling, ( - ) new weaknesses Behavioral/Psych: ( - ) mood change, ( - ) new changes  All other systems were reviewed with the patient and are negative.  PHYSICAL EXAMINATION:  There were no vitals filed for this visit. There were no vitals filed for this visit.  GENERAL: Well-appearing middle-aged Caucasian male alert, no distress and comfortable SKIN: skin color, texture, turgor are normal, no rashes or significant lesions EYES: conjunctiva are pink and non-injected, sclera clear LUNGS: clear to auscultation and percussion with normal breathing effort HEART: regular rate & rhythm and no murmurs and no lower extremity edema Musculoskeletal: no cyanosis of digits and no clubbing  PSYCH: alert & oriented x 3, fluent speech NEURO: no focal motor/sensory deficits  LABORATORY DATA:  I have reviewed the data as listed    Latest Ref Rng & Units 11/18/2022    9:47 AM 07/20/2022    3:54 PM 06/25/2022   12:20 PM  CBC  WBC 4.0 - 10.5 K/uL 7.0  11.7  8.8   Hemoglobin 13.0 - 17.0 g/dL 17.6  16.1  16.6   Hematocrit 39.0 - 52.0 % 53.1  48.7  49.9   Platelets 150 - 400 K/uL 74  96  91        Latest Ref Rng & Units 11/18/2022    9:47 AM 09/16/2022    9:57 AM 07/20/2022    3:54 PM  CMP  Glucose 70 - 99 mg/dL 74  96  113   BUN 6 - 20 mg/dL 12  16  16     Creatinine 0.61 - 1.24 mg/dL 1.17  1.08  1.24   Sodium 135 - 145 mmol/L 141  143  138   Potassium 3.5 - 5.1 mmol/L 4.3  4.4  4.2   Chloride 98 - 111 mmol/L 107  108  105   CO2 22 - 32 mmol/L 30  20  24    Calcium 8.9 - 10.3 mg/dL 9.7  9.3  8.9   Total Protein 6.5 - 8.1 g/dL 7.3     Total Bilirubin 0.3 - 1.2 mg/dL 0.9     Alkaline Phos 38 - 126 U/L 88     AST 15 - 41 U/L 25     ALT 0 - 44 U/L 21       RADIOGRAPHIC STUDIES: No results found.  ASSESSMENT & PLAN Calian Horng is a 57 y.o. male who presents to the hematology clinic for evaluation of acute pulmonary embolism. We reviewed possible etiologies for venous thromboembolisms including prolonged travel/immobility, surgery (particular abdominal or orthropedic), trauma,  and pregnancy/ estrogen containing birth control. After a detailed history and review of the records there is no clear provoking factor for this patient's VTE.  Patients with unprovoked VTEs have up to 25% recurrence after 5 years and 36% at 10 years, with 4% of these clots being fatal ([phone]). Therefore the formal recommendation for unprovoked VTE's is lifelong anticoagulation, as the cause may not be transient or reversible. We recommend 6 months or full strength anticoagulation with a re-evaluation after that time.  The patient's will then have a choice of maintenance dose DOAC (preferred, recommended), 81mg  ASA PO daily (non-preferred), or no further anticoagulation (not recommended).    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 --patient denies any bleeding, bruising, or dark stools on this medication. It is well tolerated. No difficulties accessing/affording the medication --Labs  today show white blood cell count 7.0, hemoglobin 17.6, MCV 90.9, and platelets of 74.  Okay to continue Eliquis therapy as long as platelets are greater than 50 --RTC in 6 months' time with strict return precautions for overt signs of bleeding.   #Thrombocytopenia -- Nutritional and viral workup were both negative. -- Patient has a low immature platelet fraction, does not support the diagnosis of ITP -- Recommend pursuing a ultrasound of the abdomen to assess for liver disease/splenomegaly. 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  Patient reports a large portion of his liver was removed --Patient has large ventral hernia secondary to his liver operation. -- Continue to monitor  No orders of the defined types were placed in this encounter.   All questions were answered. The patient knows to call the clinic with any problems, questions or concerns.  A total of more than 30 minutes were spent on this encounter with face-to-face time and non-face-to-face time, including preparing to see the patient, ordering tests and/or medications, counseling the patient and coordination of care as outlined above.   Ledell Peoples, MD Department of Hematology/Oncology [place] [facility] Phone: [phone] Pager: [phone] Email: [email or web address]  11/26/2022 7:24 AM

## 2022-12-01 ENCOUNTER — Ambulatory Visit (HOSPITAL_COMMUNITY): Admission: RE | Admit: 2022-12-01 | Payer: BC Managed Care – PPO | Source: Ambulatory Visit

## 2022-12-16 ENCOUNTER — Ambulatory Visit (HOSPITAL_COMMUNITY): Payer: BC Managed Care – PPO | Attending: Cardiovascular Disease

## 2022-12-16 DIAGNOSIS — I2699 Other pulmonary embolism without acute cor pulmonale: Secondary | ICD-10-CM | POA: Insufficient documentation

## 2022-12-16 DIAGNOSIS — I350 Nonrheumatic aortic (valve) stenosis: Secondary | ICD-10-CM | POA: Diagnosis not present

## 2022-12-16 DIAGNOSIS — I10 Essential (primary) hypertension: Secondary | ICD-10-CM | POA: Diagnosis not present

## 2022-12-16 DIAGNOSIS — K089 Disorder of teeth and supporting structures, unspecified: Secondary | ICD-10-CM | POA: Diagnosis not present

## 2022-12-16 LAB — ECHOCARDIOGRAM COMPLETE
AR max vel: 1.32 cm2
AV Area VTI: 1.23 cm2
AV Area mean vel: 1.27 cm2
AV Mean grad: 12 mmHg
AV Peak grad: 18.3 mmHg
Ao pk vel: 2.14 m/s
Area-P 1/2: 2.44 cm2
S' Lateral: 3.3 cm

## 2022-12-24 ENCOUNTER — Encounter: Payer: Self-pay | Admitting: Cardiovascular Disease

## 2022-12-24 ENCOUNTER — Ambulatory Visit: Payer: BC Managed Care – PPO | Attending: Cardiovascular Disease | Admitting: Cardiovascular Disease

## 2022-12-24 VITALS — BP 130/100 | HR 76 | Ht 71.0 in | Wt 214.2 lb

## 2022-12-24 DIAGNOSIS — I10 Essential (primary) hypertension: Secondary | ICD-10-CM | POA: Diagnosis not present

## 2022-12-24 DIAGNOSIS — D6859 Other primary thrombophilia: Secondary | ICD-10-CM

## 2022-12-24 DIAGNOSIS — Z952 Presence of prosthetic heart valve: Secondary | ICD-10-CM

## 2022-12-24 MED ORDER — APIXABAN 2.5 MG PO TABS: 2.5000 mg | ORAL_TABLET | Freq: Two times a day (BID) | ORAL | 3 refills | Status: DC

## 2022-12-24 MED ORDER — AMLODIPINE BESYLATE 5 MG PO TABS: 5.0000 mg | ORAL_TABLET | Freq: Every day | ORAL | 3 refills | Status: DC

## 2022-12-24 NOTE — Patient Instructions (Signed)
Medication Instructions:  START Amlodipine 5mg  daily CONTINUE Eliquis 2.5mg  twice daily *If you need a refill on your cardiac medications before your next appointment, please call your pharmacy*   Lab Work: NONE If you have labs (blood work) drawn today and your tests are completely normal, you will receive your results only by: MyChart Message (if you have MyChart) OR A paper copy in the mail If you have any lab test that is abnormal or we need to change your treatment, we will call you to review the results.   Testing/Procedures: NONE (keep ECHO appt in August)   Follow-Up: At [facility], you and your health needs are our priority.  As part of our continuing mission to provide you with exceptional heart care, we have created designated Provider Care Teams.  These Care Teams include your primary Cardiologist (physician) and Advanced Practice Providers (APPs -  Physician Assistants and Nurse Practitioners) who all work together to provide you with the care you need, when you need it.  Your next appointment:   Keep already scheduled appt's   Provider:   Tonny Bollman, MD       Other Instructions **Please monitor blood pressure after starting Amlodipine and send [place] your readings/log in a few weeks for review**

## 2022-12-24 NOTE — Progress Notes (Signed)
Cardiology Office Note:    Date:  12/24/2022   ID:  Bob Ross, DOB December 09, 1965, MRN [id number]  PCP:  Wilmon Pali, FNP   [facility] Providers Cardiologist:  Tonny Bollman, MD     Referring MD: Wilmon Pali, FNP   Chief Complaint  Patient presents with   Hypertension    History of Present Illness:    Bob Ross is a 57 y.o. male with a hx of remote metastatic colon cancer treated with radical surgery and chemotherapy with history of multiple surgeries. Has a single functioning kidney, peripheral arterial disease, and was found to have severe aortic stenosis treated with TAVR in August 2023.   The patient's TAVR was initially uncomplicated, but he developed an increase in his mean transvalvular gradient on follow-up echo from 12 mmHg to 23 mmHg.  A CTA of the heart confirmed subacute leaflet thrombosis and he was started on apixaban.  He has done well with this with improvement in his gradients back to baseline.  The patient subsequently was found to have a pulmonary embolism and it has been recommended that he remain on long-term, low-dose apixaban.  He is followed by hematology.  He is doing very well at present and denies any symptoms of chest pain, chest pressure, or shortness of breath.  He has no leg swelling, orthopnea, or PND.  His blood pressures have been elevated recently.  Past Medical History:  Diagnosis Date   CAD (coronary artery disease)    Nonobstructive by cardiac catheterization 2021 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 Excell Seltzer & Dr. Laneta Simmers   Severe aortic stenosis     Past Surgical History:   Procedure Laterality Date   COLON SURGERY     COLOSTOMY     COLOSTOMY REVERSAL     HERNIA REPAIR     INTRAOPERATIVE TRANSTHORACIC ECHOCARDIOGRAM N/A 04/17/2022   Procedure: INTRAOPERATIVE TRANSTHORACIC ECHOCARDIOGRAM;  Surgeon: Tonny Bollman, MD;  Location: [facility] OR;  Service: Open Heart Surgery;  Laterality: N/A;   LEFT HEART CATH AND CORONARY ANGIOGRAPHY N/A 04/09/2022   Procedure: LEFT HEART CATH AND CORONARY ANGIOGRAPHY;  Surgeon: Orbie Pyo, MD;  Location: [facility] INVASIVE CV LAB;  Service: Cardiovascular;  Laterality: N/A;   LIVER RESECTION     TRANSCATHETER AORTIC VALVE REPLACEMENT, TRANSFEMORAL N/A 04/17/2022   Procedure: Transcatheter Aortic Valve Replacement, Transfemoral;  Surgeon: Tonny Bollman, MD;  Location: [facility] OR;  Service: Open Heart Surgery;  Laterality: N/A;    Current Medications: Current Meds  Medication Sig   albuterol (VENTOLIN HFA) 108 (90 Base) MCG/ACT inhaler Inhale 2 puffs into the lungs every 4 (four) hours as needed for wheezing or shortness of breath.   amLODipine (NORVASC) 5 MG tablet Take 1 tablet (5 mg total) by mouth daily.   apixaban (ELIQUIS) 2.5 MG TABS tablet Take 1 tablet (2.5 mg total) by mouth 2 (two) times daily.   aspirin-acetaminophen-caffeine (EXCEDRIN MIGRAINE) 250-250-65 MG tablet Take by mouth as needed for headache.   atorvastatin (LIPITOR) 40 MG tablet Take 1 tablet (40 mg total) by mouth daily.   cholecalciferol (  VITAMIN D3) 25 MCG (1000 UNIT) tablet Take 1,000 Units by mouth daily.   Cyanocobalamin (B-12 PO) Take 1 capsule by mouth daily.   Fluticasone-Umeclidin-Vilant (TRELEGY ELLIPTA) 100-62.5-25 MCG/ACT AEPB Inhale 1 puff into the lungs daily.   ibuprofen (ADVIL) 800 MG tablet Take 800 mg by mouth daily as needed for headache.   icosapent Ethyl (VASCEPA) 1 g capsule Take 2 capsules (2 g total) by mouth 2 (two) times daily.   metoprolol succinate (TOPROL XL) 25 MG 24 hr tablet Take 0.5 tablets (12.5 mg total) by mouth daily.    senna-docusate (SENNA S) 8.6-50 MG tablet Take 1-2 tablets by mouth See admin instructions. Take 2 tablets in the morning and 2 tablet at night   traMADol (ULTRAM) 50 MG tablet Take 1 tablet (50 mg total) by mouth every 6 (six) hours as needed.   [DISCONTINUED] ELIQUIS 5 MG TABS tablet Take 1 tablet by mouth twice daily     Allergies:   Tiotropium bromide monohydrate   Social History   Socioeconomic History   Marital status: Married    Spouse name: Not on file   Number of children: Not on file   Years of education: Not on file   Highest education level: Not on file  Occupational History   Not on file  Tobacco Use   Smoking status: Every Day    Packs/day: 1.50    Years: 45.00    Additional pack years: 0.00    Total pack years: 67.50    Types: Cigarettes   Smokeless tobacco: Never  Substance and Sexual Activity   Alcohol use: Yes    Comment: Occasional   Drug use: No   Sexual activity: Not on file  Other Topics Concern   Not on file  Social History Narrative   Not on file   Social Determinants of Health   Financial Resource Strain: Not on file  Food Insecurity: Not on file  Transportation Needs: Not on file  Physical Activity: Not on file  Stress: Not on file  Social Connections: Not on file     Family History: The patient's family history includes AAA (abdominal aortic aneurysm) in his mother; Cancer in his father.  ROS:   Please see the history of present illness.    All other systems reviewed and are negative.  EKGs/Labs/Other Studies Reviewed:    The following studies were reviewed today: Cardiac Studies & Procedures   CARDIAC CATHETERIZATION  CARDIAC CATHETERIZATION 04/09/2022  Narrative 1.  Mild obstructive coronary artery disease. 2.  Severely elevated LVEDP of 29 mmHg. 3.  Heavily calcified aortic valve with a mean gradient of 52 mmHg and a peak to peak gradient of 62 mmHg consistent with severe aortic stenosis; aortic waveforms demonstrated parvus  and tardus. 4.  Preserved left ventricular ejection fraction.  Recommendation: Echocardiogram to evaluate for severe aortic stenosis which will inform therapy moving forward.  Findings Coronary Findings Diagnostic  Dominance: Right  Left Anterior Descending There is mild diffuse disease throughout the vessel.  Right Coronary Artery There is moderate diffuse disease throughout the vessel.  Intervention  No interventions have been documented.     ECHOCARDIOGRAM  ECHOCARDIOGRAM COMPLETE 12/16/2022  Narrative ECHOCARDIOGRAM REPORT    Patient Name:   Bob Ross     Date of Exam: 12/16/2022 Medical Rec #:  [id number]     Height:       71.5 in Accession #:    [id number]    Weight:       214.1  lb Date of Birth:  06-12-1966      BSA:          2.182 m Patient Age:    56 years      BP:           145/100 mmHg Patient Gender: M             HR:           65 bpm. Exam Location:  [address]  Procedure: 2D Echo, Cardiac Doppler and Color Doppler  Indications:    I35.00 Aortic Stenosis  History:        Patient has prior history of Echocardiogram examinations, most recent 09/16/2022. CAD and Previous Myocardial Infarction, Pulmonary embolism; Risk Factors:Hypertension.  Sonographer:    Clearence Ped RCS Referring Phys: [phone] Jewelene Mairena  IMPRESSIONS   1. Posterior lateral hypokinesis . Left ventricular ejection fraction, by estimation, is 50 to 55%. The left ventricle has low normal function. The left ventricle demonstrates regional wall motion abnormalities (see scoring diagram/findings for description). Left ventricular diastolic parameters were normal. 2. Right ventricular systolic function is normal. The right ventricular size is normal. 3. The mitral valve is normal in structure. Trivial mitral valve regurgitation. No evidence of mitral stenosis. 4. Post repair with TAVR 26 mm Sapien 3 stable low gradients no PVL. The aortic valve has been repaired/replaced. Aortic valve  regurgitation is not visualized. No aortic stenosis is present. Procedure Date: 04/17/2022. 5. The inferior vena cava is normal in size with greater than 50% respiratory variability, suggesting right atrial pressure of 3 mmHg.  FINDINGS Left Ventricle: Posterior lateral hypokinesis. Left ventricular ejection fraction, by estimation, is 50 to 55%. The left ventricle has low normal function. The left ventricle demonstrates regional wall motion abnormalities. The left ventricular internal cavity size was normal in size. There is no left ventricular hypertrophy. Left ventricular diastolic parameters were normal.  Right Ventricle: The right ventricular size is normal. No increase in right ventricular wall thickness. Right ventricular systolic function is normal.  Left Atrium: Left atrial size was normal in size.  Right Atrium: Right atrial size was normal in size.  Pericardium: Trivial pericardial effusion is present. The pericardial effusion is anterior to the right ventricle.  Mitral Valve: The mitral valve is normal in structure. Trivial mitral valve regurgitation. No evidence of mitral valve stenosis.  Tricuspid Valve: The tricuspid valve is normal in structure. Tricuspid valve regurgitation is not demonstrated. No evidence of tricuspid stenosis.  Aortic Valve: Post repair with TAVR 26 mm Sapien 3 stable low gradients no PVL. The aortic valve has been repaired/replaced. Aortic valve regurgitation is not visualized. No aortic stenosis is present. Aortic valve mean gradient measures 12.0 mmHg. Aortic valve peak gradient measures 18.3 mmHg. Aortic valve area, by VTI measures 1.23 cm. There is a 26 mm Sapien prosthetic, stented (TAVR) valve present in the aortic position.  Pulmonic Valve: The pulmonic valve was normal in structure. Pulmonic valve regurgitation is not visualized. No evidence of pulmonic stenosis.  Aorta: The aortic root is normal in size and structure.  Venous: The inferior vena  cava is normal in size with greater than 50% respiratory variability, suggesting right atrial pressure of 3 mmHg.  IAS/Shunts: No atrial level shunt detected by color flow Doppler.   LEFT VENTRICLE PLAX 2D LVIDd:         4.60 cm   Diastology LVIDs:         3.30 cm   LV e' medial:  6.20 cm/s LV PW:         1.20 cm   LV E/e' medial:  9.2 LV IVS:        1.00 cm   LV e' lateral:   9.36 cm/s LVOT diam:     2.00 cm   LV E/e' lateral: 6.1 LV SV:         63 LV SV Index:   29 LVOT Area:     3.14 cm   RIGHT VENTRICLE RV Basal diam:  3.60 cm RV S prime:     11.20 cm/s TAPSE (M-mode): 2.0 cm  LEFT ATRIUM             Index        RIGHT ATRIUM          Index LA diam:        3.10 cm 1.42 cm/m   RA Area:     8.89 cm LA Vol (A2C):   38.8 ml 17.78 ml/m  RA Volume:   14.30 ml 6.55 ml/m LA Vol (A4C):   26.4 ml 12.08 ml/m LA Biplane Vol: 34.7 ml 15.91 ml/m AORTIC VALVE AV Area (Vmax):    1.32 cm AV Area (Vmean):   1.27 cm AV Area (VTI):     1.23 cm AV Vmax:           214.00 cm/s AV Vmean:          164.000 cm/s AV VTI:            0.508 m AV Peak Grad:      18.3 mmHg AV Mean Grad:      12.0 mmHg LVOT Vmax:         90.20 cm/s LVOT Vmean:        66.100 cm/s LVOT VTI:          0.199 m LVOT/AV VTI ratio: 0.39  AORTA Ao Root diam: 2.70 cm Ao Asc diam:  3.70 cm  MITRAL VALVE MV Area (PHT): 2.44 cm    SHUNTS MV Decel Time: 311 msec    Systemic VTI:  0.20 m MV E velocity: 57.10 cm/s  Systemic Diam: 2.00 cm MV A velocity: 72.80 cm/s MV E/A ratio:  0.78  Charlton Haws MD Electronically signed by Charlton Haws MD Signature Date/Time: 12/16/2022/10:09:21 AM    Final     CT SCANS  CT CORONARY MORPH W/CTA COR W/SCORE 10/02/2022  Addendum 10/02/2022  9:13 PM ADDENDUM REPORT: 10/02/2022 21:10  EXAM: OVER-READ INTERPRETATION  CT CHEST  The following report is an over-read performed by radiologist Dr. Noe Gens [facility] on 10/02/2022. This over-read does not  include interpretation of cardiac or coronary anatomy or pathology. The coronary CTA interpretation by the cardiologist is attached.  COMPARISON:  06/02/2022  FINDINGS: Heart is normal size. Aorta normal caliber. Scattered calcifications. Prior TAVR. Calcified subcarinal and right paratracheal lymph nodes as well as right hilar lymph nodes. Linear scarring in the lung bases. No acute confluent opacity or effusion. No acute findings in the upper abdomen. Chest wall soft tissues are unremarkable. No acute bony abnormality.  IMPRESSION: No acute extra cardiac abnormality.  Old granulomatous disease.  Scattered aortic atherosclerosis.   Electronically Signed By: Charlett Nose M.D. On: 10/02/2022 21:10  Narrative CLINICAL DATA:  Post TAVR  EXAM: Cardiac CTA  TECHNIQUE: Gated cardiac CTA performed with no beta blocker or nitro for post TAVR valve evaluation Retrospective exam done to evaluate leaflet motion  Comparison CT done 06/02/22 Patient received  80 cc contrast Scanned at 120 kV with average HR of 58 bpm  FINDINGS: Calcium Score: LM/3 vessel involvement  LM 67.6  LAD 345  LCX 85.7  RCA 412  Total 910  Aorta: No aneurysm. Ascending thoracic aorta 3.4 cm Mild calcific atherosclerosis Arch vessels not visualized  The is a 26 mm Sapien 3 stented TAVR valve in good position. No peri valvular leak Compared to CTA done 06/02/22 leaflet motion is improved. The previously described severe HALT/HAM is also  Much improved which corresponds to improved gradients on recent echo. There is still some hypo-attenuation that the base of each leaflet commissure  IMPRESSION: 1. LM/ 3 vessel calcium with score 910 which is 99 th percentile for age/sex  2.  Normal ascending thoracic aorta 3.4 cm  3. Marked improvement in HALT/HAM on valve leaflets with improved leaflet motion Still with mild hypo attenuation at base of each commissure Consider 3 more months of  anticoagulation and then  Follow gradients by echo and only repeat CTA if gradients go back up after stopping DOAC  Charlton Haws  Electronically Signed: By: Charlton Haws M.D. On: 10/01/2022 10:30   CT SCANS  CT CORONARY MORPH W/CTA COR W/SCORE 06/02/2022  Addendum 06/02/2022  3:20 PM ADDENDUM REPORT: 06/02/2022 15:18  CLINICAL DATA:  S/p TAVR. Increased gradients across the prosthesis. 26 mm S3 04/17/2022.  EXAM: Cardiac TAVR CT  TECHNIQUE: A non-contrast, gated CT scan was obtained with axial slices of 3 mm through the heart for aortic valve calcium scoring. A 120 kV retrospective, gated, contrast cardiac scan was obtained. Gantry rotation speed was 250 msecs and collimation was 0.6 mm. Nitroglycerin was not given. The 3D data set was reconstructed in 5% intervals of the 0-95% of the R-R cycle. Systolic and diastolic phases were analyzed on a dedicated workstation using MPR, MIP, and VRT modes. The patient received 100 cc of contrast.  FINDINGS: Image quality: Excellent.  Noise artifact is: Limited.  Aortic valve prosthesis: A 26 mm S3 TAVR is present. There is severe HALT with HAM of the leaflets of the NCC/LCC. The HALT covers all of these leaflets. There is HALT at the base of the leaflet of the RCC. There is no evidence of paravalvular leak. This is consistent with leaflet thrombosis.  Coronary Arteries: Normal coronary origin. Right dominance. The study was performed without use of NTG and is insufficient for plaque evaluation. Please refer to recent cardiac catheterization for coronary assessment. 3-vessel coronary calcifications noted.  Cardiac Morphology:  Right Atrium: Right atrial size is within normal limits.  Right Ventricle: The right ventricular cavity is within normal limits.  Left Atrium: Left atrial size is normal in size with no left atrial appendage filling defect.  Left Ventricle: The ventricular cavity size is within normal  limits.  Pulmonary arteries: Normal in size.  Pulmonary veins: Normal pulmonary venous drainage.  Pericardium: Normal thickness with no significant effusion or calcium present.  Mitral Valve: The mitral valve is normal structure without significant calcification.  Aorta: Normal caliber.  Extra-cardiac findings: See attached radiology report for non-cardiac structures.  IMPRESSION: 1. 26 mm S3 with severe HALT/HAM consistent with leaflet thrombosis.  2. 3-vessel coronary calcifications.  Gerri Spore T. Flora Lipps, MD   Electronically Signed By: Lennie Odor M.D. On: 06/02/2022 15:18  Narrative EXAM: OVER-READ INTERPRETATION  CT CHEST  The following report is a limited chest CT over-read performed by radiologist Dr. Allegra Lai of [facility] on 06/02/2022. This over-read does not include interpretation  of cardiac or coronary anatomy or pathology. The coronary CTA interpretation by the cardiologist is attached.  COMPARISON:  Cardiac CT dated April 10, 2022  FINDINGS: Vascular: Acute pulmonary embolus of the distal right pulmonary artery with extension into the right upper lobe lobar artery, finding is new when compared with prior heart CT dated April 10, 2022. No CT evidence of right heart strain (RV/LV ratio of 0.7). Interval transcatheter aortic valve replacement. Normal caliber thoracic aorta with mild atherosclerotic disease.  Mediastinum/Nodes: Esophagus is unremarkable. No pathologically enlarged lymph nodes seen in the visualized chest. Calcified mediastinal lymph nodes, likely sequela of prior granulomatous infection  Lungs/Pleura: Trace right pleural effusion and right basilar atelectasis. Linear opacities of the lower right lung, likely due to scarring or atelectasis. Calcified granulomas of the right lower lobe.  Upper Abdomen: No acute abnormality.  Musculoskeletal: No chest wall mass or suspicious bone  lesions identified.  IMPRESSION: 1. Acute pulmonary embolus of the distal right pulmonary artery with extension into the right upper lobe lobar artery. No CT evidence of right heart strain. 2. Trace right pleural effusion and right basilar atelectasis. 3. Aortic Atherosclerosis (ICD10-I70.0).  Critical Value/emergent results were called by telephone at the time of interpretation on 06/02/2022 at 1:11 pm to provider Georgie Chard, NP, who verbally acknowledged these results.  Electronically Signed: By: Allegra Lai M.D. On: 06/02/2022 13:14   CT SCANS  CT CORONARY MORPH W/CTA COR W/SCORE 04/10/2022  Addendum 04/10/2022 10:12 PM ADDENDUM REPORT: 04/10/2022 22:10  CLINICAL DATA:  38 -year-old male with severe aortic stenosis being evaluated for a TAVR procedure.  EXAM: Cardiac TAVR CT  TECHNIQUE: The patient was scanned on a [employer]. A 120 kV retrospective scan was triggered in the descending thoracic aorta at 111 HU's. Gantry rotation speed was 250 msecs and collimation was .6 mm. No beta blockade or nitro were given. The 3D data set was reconstructed in 5% intervals of the R-R cycle. Systolic and diastolic phases were analyzed on a dedicated work station using MPR, MIP and VRT modes. The patient received 100 cc of contrast.  FINDINGS: Aortic Root:  Aortic valve: Bicuspid aortic valve with fusion of right and noncoronary cusps  Aortic valve calcium score: 4864  Aortic annulus:  Diameter: 28mm x 28mm  Perimeter: 86mm  Area: 579 mm^2  Calcifications: Moderate calcification adjacent to right coronary cusp  Coronary height: Min Left - 14mm, Max Left - 21mm; Min Right - 14mm  Sinotubular height: Left cusp - 25mm; Right cusp - 20mm; Noncoronary cusp - 22mm  LVOT (as measured 3 mm below the annulus):  Diameter: 30mm x 26mm  Area: 630mm^2  Calcifications: Moderate calcification inferior to RCC  Aortic sinus width: Left cusp - 35mm; Right  cusp - 31mm; Noncoronary cusp - 31mm  Sinotubular junction width: 31mm x 29mm  Optimum Fluoroscopic Angle for Delivery: RAO 3 CRA 9  Cardiac:  Right atrium: Normal size  Right ventricle: Normal size  Pulmonary arteries: Normal size  Pulmonary veins: Normal configuration  Left atrium: Mild enlargement  Left ventricle: Normal size  Pericardium: Normal thickness  Coronary arteries: Coronary calcium score 757 (98th percentile)  IMPRESSION: 1. Bicuspid aortic valve with fusion of right and noncoronary cusps. Severe calcifications (AV calcium score 4864)  2. Aortic annulus measures 28mm x 28mm with perimeter 86mm and area 579 mm^2. Moderate annular calcifications adjacent to right coronary cusp and extending into LVOT. Annular measurements are suitable for delivery of 29mm Edwards Sapien 3 valve  3.  Sufficient coronary to annulus distance, measuring 14mm to left main and 14mm to RCA  4. Optimum Fluoroscopic Angle for Delivery:  RAO 3 CRA 9  5. Coronary calcium score 757 (98th percentile)   Electronically Signed By: Epifanio Lesches M.D. On: 04/10/2022 22:10  Narrative EXAM: OVER-READ INTERPRETATION  CT CHEST  The following report is a limited chest CT over-read performed by radiologist Dr. Allegra Lai of [facility] on 04/10/2022. This over-read does not include interpretation of cardiac or coronary anatomy or pathology. The cardiac TAVR interpretation by the cardiologist is attached.  FINDINGS: Extracardiac findings will be described separately under dictation for contemporaneously obtained CTA chest, abdomen and pelvis.  IMPRESSION: Please see separate dictation for contemporaneously obtained CTA chest, abdomen and pelvis dated 04/10/2022 for full description of relevant extracardiac findings.  Electronically Signed: By: Allegra Lai M.D. On: 04/10/2022 15:48           EKG:  EKG is not ordered today.    Recent  Labs: 04/18/2022: Magnesium 1.9 11/18/2022: ALT 21; BUN 12; Creatinine 1.17; Hemoglobin 17.6; Platelet Count 74; Potassium 4.3; Sodium 141  Recent Lipid Panel    Component Value Date/Time   CHOL 126 04/10/2022 0142   TRIG 309 (H) 04/10/2022 0142   HDL 36 (L) 04/10/2022 0142   CHOLHDL 3.5 04/10/2022 0142   VLDL 62 (H) 04/10/2022 0142   LDLCALC 28 04/10/2022 0142     Risk Assessment/Calculations:           Physical Exam:    VS:  BP (!) 130/100 Comment: Left arm 140/100  Pulse 76   Ht 5\' 11"  (1.803 m)   Wt 214 lb 3.2 oz (97.2 kg)   SpO2 94%   BMI 29.87 kg/m     Wt Readings from Last 3 Encounters:  12/24/22 214 lb 3.2 oz (97.2 kg)  11/26/22 214 lb 1 oz (97.1 kg)  09/16/22 216 lb 6.4 oz (98.2 kg)     GEN:  Well nourished, well developed in no acute distress HEENT: Normal NECK: No JVD; No carotid bruits LYMPHATICS: No lymphadenopathy CARDIAC: RRR, 2/6 systolic murmur at the right upper sternal border RESPIRATORY:  Clear to auscultation without rales, wheezing or rhonchi  ABDOMEN: Soft, non-tender, non-distended MUSCULOSKELETAL:  No edema; No deformity  SKIN: Warm and dry NEUROLOGIC:  Alert and oriented x 3 PSYCHIATRIC:  Normal affect   ASSESSMENT:    1. S/P TAVR (transcatheter aortic valve replacement)   2. Hypercoagulable state (HCC)   3. Essential hypertension    PLAN:    In order of problems listed above:  Patient doing well.  Recent echo reviewed with stable results.  He does not appear to have any recurrence of subacute leaflet thrombosis.  His apixaban dose is now being reduced to 2.5 mg twice daily.  He is scheduled for his 1 year echocardiogram in August with an APP follow-up visit at that time.  He follows SBE prophylaxis when indicated.  Continue current management. Apixaban 2.5 mg twice daily, likely indefinite therapy Blood pressure is above goal.  Starting amlodipine 5 mg daily.  Advised to record home blood pressure readings 3 to 4 days/week for a few  weeks.  We may need to further increase his amlodipine dose and/or add another antihypertensive agent he reports follow-up with primary care within 1 month.           Medication Adjustments/Labs and Tests Ordered: Current medicines are reviewed at length with the patient today.  Concerns regarding medicines are  outlined above.  No orders of the defined types were placed in this encounter.  Meds ordered this encounter  Medications   amLODipine (NORVASC) 5 MG tablet    Sig: Take 1 tablet (5 mg total) by mouth daily.    Dispense:  90 tablet    Refill:  3   apixaban (ELIQUIS) 2.5 MG TABS tablet    Sig: Take 1 tablet (2.5 mg total) by mouth 2 (two) times daily.    Dispense:  180 tablet    Refill:  3    Dose DECREASE-please place rx on file until pt calls to have filled    Patient Instructions  Medication Instructions:  START Amlodipine 5mg  daily CONTINUE Eliquis 2.5mg  twice daily *If you need a refill on your cardiac medications before your next appointment, please call your pharmacy*   Lab Work: NONE If you have labs (blood work) drawn today and your tests are completely normal, you will receive your results only by: MyChart Message (if you have MyChart) OR A paper copy in the mail If you have any lab test that is abnormal or we need to change your treatment, we will call you to review the results.   Testing/Procedures: NONE (keep ECHO appt in August)   Follow-Up: At [facility], you and your health needs are our priority.  As part of our continuing mission to provide you with exceptional heart care, we have created designated Provider Care Teams.  These Care Teams include your primary Cardiologist (physician) and Advanced Practice Providers (APPs -  Physician Assistants and Nurse Practitioners) who all work together to provide you with the care you need, when you need it.  Your next appointment:   Keep already scheduled appt's   Provider:   Tonny Bollman, MD        Other Instructions **Please monitor blood pressure after starting Amlodipine and send [place] your readings/log in a few weeks for review**   Signed, Tonny Bollman, MD  12/24/2022 6:01 PM    [facility]

## 2023-01-29 ENCOUNTER — Other Ambulatory Visit: Payer: Self-pay | Admitting: Physician Assistant

## 2023-02-05 DIAGNOSIS — D751 Secondary polycythemia: Secondary | ICD-10-CM | POA: Diagnosis not present

## 2023-02-05 DIAGNOSIS — E785 Hyperlipidemia, unspecified: Secondary | ICD-10-CM | POA: Diagnosis not present

## 2023-02-05 DIAGNOSIS — Z125 Encounter for screening for malignant neoplasm of prostate: Secondary | ICD-10-CM | POA: Diagnosis not present

## 2023-02-05 DIAGNOSIS — I2510 Atherosclerotic heart disease of native coronary artery without angina pectoris: Secondary | ICD-10-CM | POA: Diagnosis not present

## 2023-02-05 DIAGNOSIS — J449 Chronic obstructive pulmonary disease, unspecified: Secondary | ICD-10-CM | POA: Diagnosis not present

## 2023-02-16 ENCOUNTER — Other Ambulatory Visit: Payer: Self-pay | Admitting: Cardiovascular Disease

## 2023-02-18 ENCOUNTER — Ambulatory Visit: Payer: BC Managed Care – PPO | Admitting: Orthopaedic Surgery

## 2023-02-18 ENCOUNTER — Other Ambulatory Visit (INDEPENDENT_AMBULATORY_CARE_PROVIDER_SITE_OTHER): Payer: BC Managed Care – PPO

## 2023-02-18 ENCOUNTER — Encounter: Payer: Self-pay | Admitting: Orthopaedic Surgery

## 2023-02-18 VITALS — BP 140/82 | HR 67 | Ht 71.5 in | Wt 208.0 lb

## 2023-02-18 DIAGNOSIS — M21371 Foot drop, right foot: Secondary | ICD-10-CM

## 2023-02-18 DIAGNOSIS — M5442 Lumbago with sciatica, left side: Secondary | ICD-10-CM | POA: Diagnosis not present

## 2023-02-18 DIAGNOSIS — G8929 Other chronic pain: Secondary | ICD-10-CM | POA: Diagnosis not present

## 2023-02-18 DIAGNOSIS — M5441 Lumbago with sciatica, right side: Secondary | ICD-10-CM

## 2023-02-18 NOTE — Patient Instructions (Addendum)
[phone] Patient is to call central scheduling make appointment and call us back for follow up

## 2023-02-18 NOTE — Progress Notes (Signed)
My back is hurting me and my foot and ankle are not working right.  He has had lower back pain with pain running down the right side to the foot.  He has lost ability at times to walk well as he has numbness and inability to raise his right foot --  right foot drop.  He saw his family doctor last week and appointment made today.  He had to use crutches a few days as he could not walk well.  He is better today and has ankle motion but he says as the day progresses he gets weak.  He has no trauma, no bowel or bladder problems.  He has had heart problems last year with a special stent in place.  He cannot take NSAIDs.  Lower back is tender on the right side, no spasm, ROM is full, muscle tone and strength is normal except for the right foot.  Peroneal strength on the right ankle is 4 of 5 today, reflexes are good.  He has a slight limp on the right.  X-rays were done of the lower back, reported separately.  Encounter Diagnoses  Name Primary?   Chronic midline low back pain with bilateral sciatica Yes   Foot drop, right    I am concerned about the weakness of the right foot/ankle and history of intermittent foot drop.  I will get MRI of the lumbar spine.  Return after MRI.  Call if any problem.  Precautions discussed.  Electronically Signed Darreld Mclean, MD 6/26/20248:56 AM

## 2023-02-23 IMAGING — DX DG CHEST 2V
2 series · 2 of 2 positions shown · non-contrast
Comparison: 10/09/2017

CLINICAL DATA: Chest pain

EXAM:
CHEST - 2 VIEW

[chest pa]
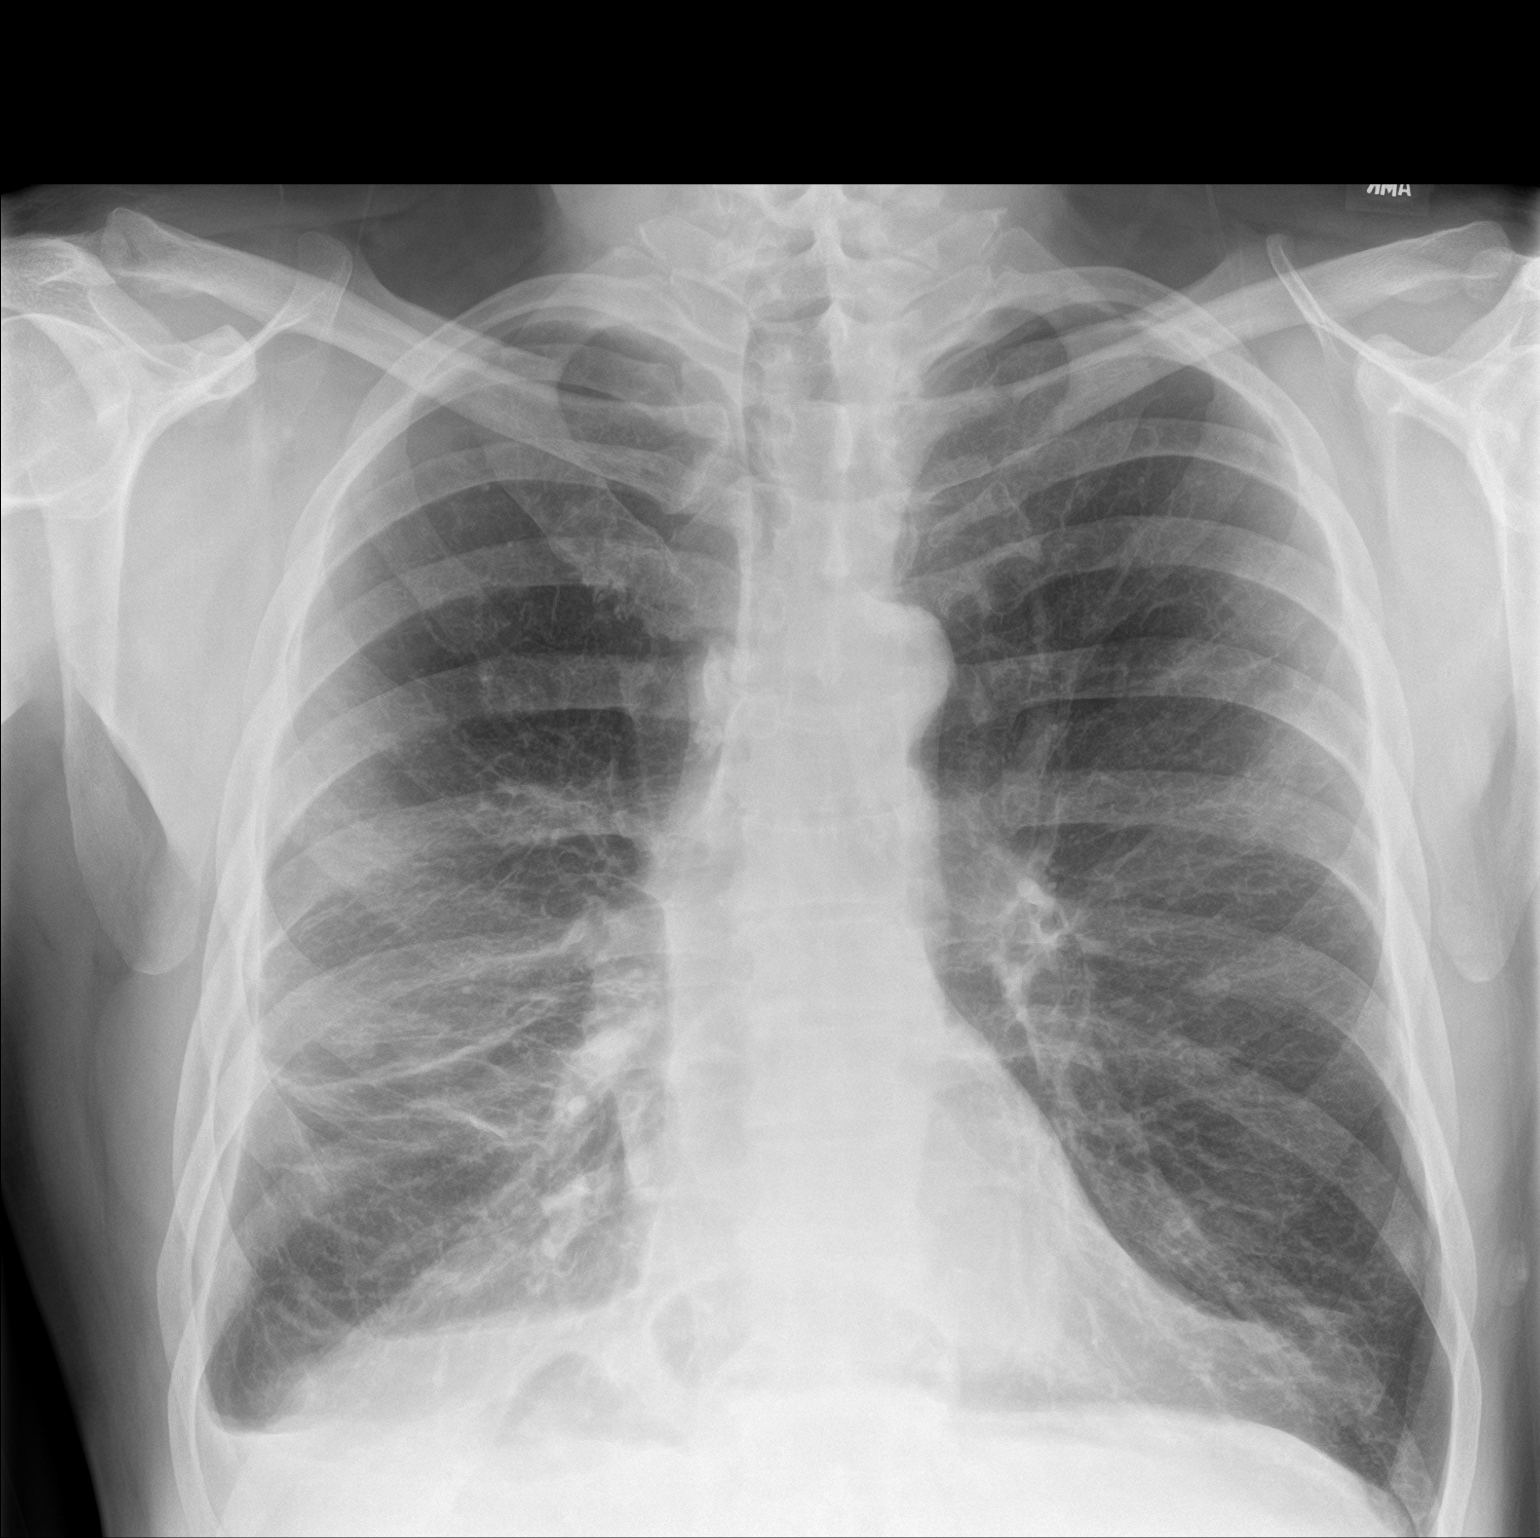

[chest lat]
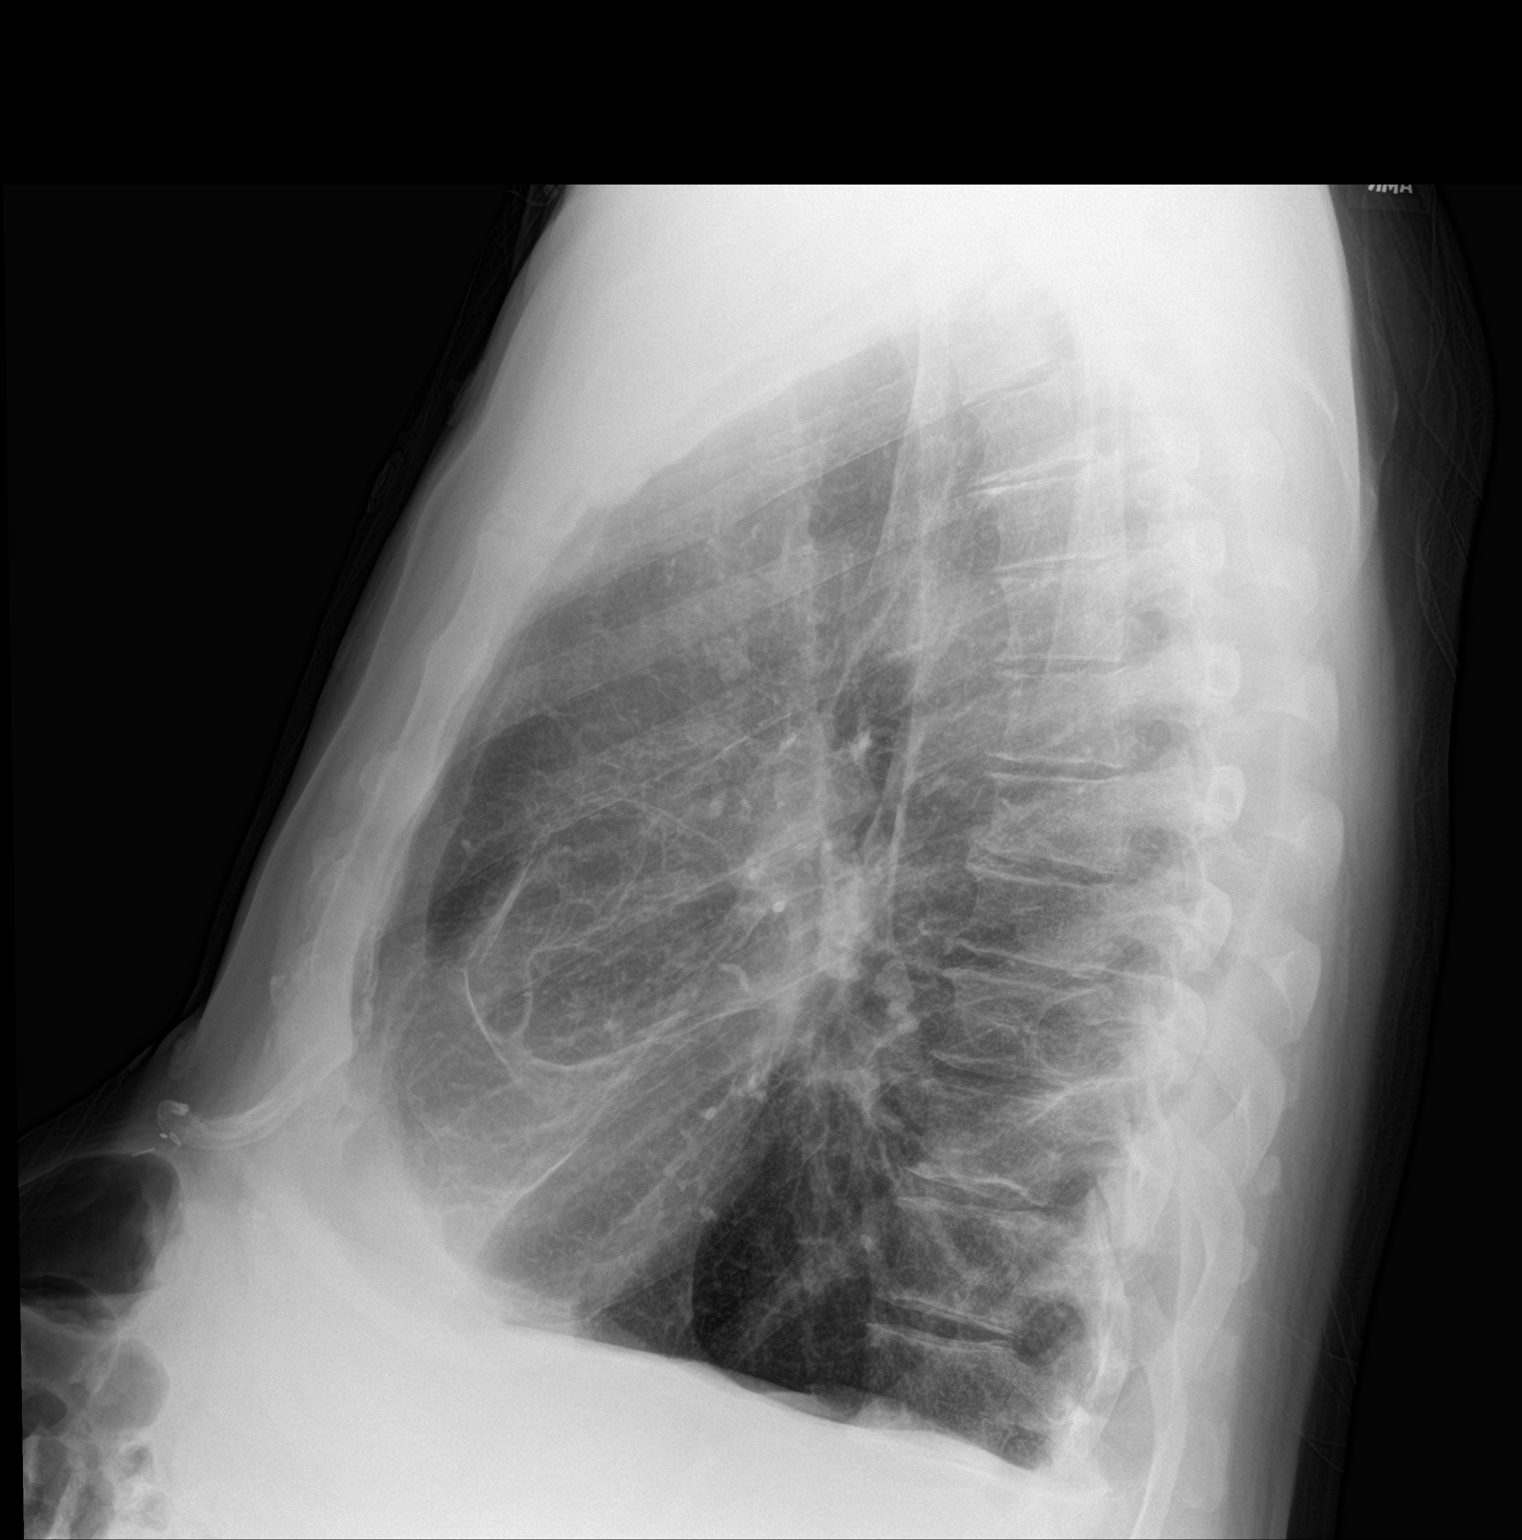

[2 of 2 positions shown; findings below may reference images not displayed]

FINDINGS: Heart size is normal. Hyperinflated lungs with chronically coarsened
interstitial markings. Extensive linear scarring within the right
mid lung and right lung base. Chronic blunting of the right
costophrenic angle, likely reflecting scarring. Mild left basilar
scarring or atelectasis. No superimposed airspace opacities seen. No
large pleural fluid collection. No pneumothorax.
IMPRESSION: 1. COPD with chronic scarring in the right lung base.
2. Mild left basilar scarring or atelectasis.

## 2023-02-24 ENCOUNTER — Ambulatory Visit
Admission: RE | Admit: 2023-02-24 | Discharge: 2023-02-24 | Disposition: A | Discharge status: Home / Self Care | Payer: BC Managed Care – PPO | Source: Ambulatory Visit | Attending: Orthopaedic Surgery | Admitting: Orthopaedic Surgery

## 2023-02-24 DIAGNOSIS — M21371 Foot drop, right foot: Secondary | ICD-10-CM

## 2023-02-24 DIAGNOSIS — G8929 Other chronic pain: Secondary | ICD-10-CM

## 2023-02-24 DIAGNOSIS — M5136 Other intervertebral disc degeneration, lumbar region: Secondary | ICD-10-CM | POA: Diagnosis not present

## 2023-02-24 DIAGNOSIS — M47816 Spondylosis without myelopathy or radiculopathy, lumbar region: Secondary | ICD-10-CM | POA: Diagnosis not present

## 2023-02-24 DIAGNOSIS — M545 Low back pain, unspecified: Secondary | ICD-10-CM | POA: Diagnosis not present

## 2023-02-24 MED ORDER — GADOPICLENOL 0.5 MMOL/ML IV SOLN
9.0000 mL | Freq: Once | INTRAVENOUS | Status: AC | PRN
  Administered 2023-02-24: 9 mL via INTRAVENOUS

## 2023-03-01 ENCOUNTER — Other Ambulatory Visit: Payer: Self-pay | Admitting: Cardiovascular Disease

## 2023-03-02 ENCOUNTER — Other Ambulatory Visit: Payer: Self-pay

## 2023-03-02 MED ORDER — ICOSAPENT ETHYL 1 G PO CAPS: 2.0000 g | ORAL_CAPSULE | Freq: Two times a day (BID) | ORAL | 10 refills | Status: AC

## 2023-03-10 ENCOUNTER — Ambulatory Visit: Payer: BC Managed Care – PPO | Admitting: Orthopaedic Surgery

## 2023-03-18 ENCOUNTER — Encounter: Payer: Self-pay | Admitting: Orthopaedic Surgery

## 2023-03-18 ENCOUNTER — Ambulatory Visit (INDEPENDENT_AMBULATORY_CARE_PROVIDER_SITE_OTHER): Payer: BC Managed Care – PPO | Admitting: Orthopaedic Surgery

## 2023-03-18 VITALS — BP 135/88 | HR 72

## 2023-03-18 DIAGNOSIS — F17210 Nicotine dependence, cigarettes, uncomplicated: Secondary | ICD-10-CM | POA: Diagnosis not present

## 2023-03-18 DIAGNOSIS — M4696 Unspecified inflammatory spondylopathy, lumbar region: Secondary | ICD-10-CM

## 2023-03-18 DIAGNOSIS — G8929 Other chronic pain: Secondary | ICD-10-CM

## 2023-03-18 DIAGNOSIS — M5136 Other intervertebral disc degeneration, lumbar region: Secondary | ICD-10-CM | POA: Diagnosis not present

## 2023-03-18 DIAGNOSIS — I700 Atherosclerosis of aorta: Secondary | ICD-10-CM

## 2023-03-18 NOTE — Patient Instructions (Signed)
Smoking Tobacco Information, Adult Smoking tobacco can be harmful to your health. Tobacco contains a toxic colorless chemical called nicotine. Nicotine causes changes in your brain that make you want more and more. This is called addiction. This can make it hard to stop smoking once you start. Tobacco also has other toxic chemicals that can hurt your body and raise your risk of many cancers. Menthol or "lite" tobacco or cigarette brands are not safer than regular brands. How can smoking tobacco affect me? Smoking tobacco puts you at risk for: Cancer. Smoking is most commonly associated with lung cancer, but can also lead to cancer in other parts of the body. Chronic obstructive pulmonary disease (COPD). This is a long-term lung condition that makes it hard to breathe. It also gets worse over time. High blood pressure (hypertension), heart disease, stroke, heart attack, and lung infections, such as pneumonia. Cataracts. This is when the lenses in the eyes become clouded. Digestive problems. This may include peptic ulcers, heartburn, and gastroesophageal reflux disease (GERD). Oral health problems, such as gum disease, mouth sores, and tooth loss. Loss of taste and smell. Smoking also affects how you look and smell. Smoking may cause: Wrinkles. Yellow or stained teeth, fingers, and fingernails. Bad breath. Bad-smelling clothes and hair. Smoking tobacco can also affect your social life, because: It may be challenging to find places to smoke when away from home. Many workplaces, restaurants, hotels, and public places are tobacco-free. Smoking is expensive. This is due to the cost of tobacco and the long-term costs of treating health problems from smoking. Secondhand smoke may affect those around you. Secondhand smoke can cause lung cancer, breathing problems, and heart disease. Children of smokers have a higher risk for: Sudden infant death syndrome (SIDS). Ear infections. Lung infections. What  actions can I take to prevent health problems? Quit smoking  Do not start smoking. Quit if you already smoke. Do not replace cigarette smoking with vaping devices, such as e-cigarettes. Make a plan to quit smoking and commit to it. Look for programs to help you, and ask your health care provider for recommendations and ideas. Set a date and write down all the reasons you want to quit. Let your friends and family know you are quitting so they can help and support you. Consider finding friends who also want to quit. It can be easier to quit with someone else, so that you can support each other. Talk with your health care provider about using nicotine replacement medicines to help you quit. These include gum, lozenges, patches, sprays, or pills. If you try to quit but return to smoking, stay positive. It is common to slip up when you first quit, so take it one day at a time. Be prepared for cravings. When you feel the urge to smoke, chew gum or suck on hard candy. Lifestyle Stay busy. Take care of your body. Get plenty of exercise, eat a healthy diet, and drink plenty of water. Find ways to manage your stress, such as meditation, yoga, exercise, or time spent with friends and family. Ask your health care provider about having regular tests (screenings) to check for cancer. This may include blood tests, imaging tests, and other tests. Where to find support To get support to quit smoking, consider: Asking your health care provider for more information and resources. Joining a support group for people who want to quit smoking in your local community. There are many effective programs that may help you to quit. Calling the smokefree.gov counselor   helpline at 1-800-QUIT-NOW (1-800-784-8669). Where to find more information You may find more information about quitting smoking from: Centers for Disease Control and Prevention: cdc.gov/tobacco Smokefree.gov: smokefree.gov American Lung Association:  freedomfromsmoking.org Contact a health care provider if: You have problems breathing. Your lips, nose, or fingers turn blue. You have chest pain. You are coughing up blood. You feel like you will faint. You have other health changes that cause you to worry. Summary Smoking tobacco can negatively affect your health, the health of those around you, your finances, and your social life. Do not start smoking. Quit if you already smoke. If you need help quitting, ask your health care provider. Consider joining a support group for people in your local community who want to quit smoking. There are many effective programs that may help you to quit. This information is not intended to replace advice given to you by your health care provider. Make sure you discuss any questions you have with your health care provider. Document Revised: 08/06/2021 Document Reviewed: 08/06/2021 Elsevier Patient Education  2024 [employer].  

## 2023-03-18 NOTE — Progress Notes (Signed)
My back is sore.  He had the MRI and it showed: IMPRESSION: 1. 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 2. Disc degeneration at L5-S1 with minimal annular bulging and a tiny annular fissure. No stenosis of the canal or foramina. 3. Aortic atherosclerosis with maximal diameter of 3.2 cm just above the bifurcation. Recommend follow-up ultrasound every 3 years. This recommendation follows ACR consensus guidelines: White Paper of the ACR Incidental Findings Committee II on Vascular Findings. J Am Coll Radiol 2013; 10:789-794.  I have explained the findings to him.  I have asked him to consider epidural.  He will think about it.  He is on Eliquis and cannot take NSAIDs.  He has tenderness of the lower back, no spasm, muscle tone and strength normal, NV intact.  I have independently reviewed the MRI.    Encounter Diagnoses  Name Primary?   Chronic midline low back pain with bilateral sciatica Yes   Nicotine dependence, cigarettes, uncomplicated    He wants to think about the epidural.  I will see as needed.  I informed him of the changes in the distal aorta.  He is being followed by cardiology and will discuss with them.  Call if any problem.  Precautions discussed.  Electronically Signed Darreld Mclean, MD 7/24/20248:30 AM

## 2023-03-24 ENCOUNTER — Other Ambulatory Visit (HOSPITAL_COMMUNITY): Payer: Self-pay

## 2023-04-14 NOTE — Progress Notes (Unsigned)
[facility]   MULTIDISCIPLINARY HEART VALVE CLINIC                                     Cardiology Office Note:    Date:  04/15/2023   ID:  Jasten Duckwall, DOB 01/12/1966, MRN [id number]  PCP:  Wilmon Pali, FNP  [facility] Cardiologist:  Tonny Bollman, MD  [facility] Electrophysiologist:  None   Referring MD: Wilmon Pali, FNP   1 year s/p TAVR  History of Present Illness:    Brannen Hellings is a 57 y.o. 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   He was admitted to [facility] in 03/2022 for acute chest pain and found to have a NSTEMI (pk troponin 1,146). LHC on 8/16 showed mild non-obstructive CAD but severe aortic stenosis and severely elevated LVEDP. Echo showed LVEF of 50-55% with hypokinesis of the anterolateral wall and posterior wall as well as severe AS. He was evaluated by the structural heart team and underwent successful TAVR with a 26 mm Edwards S3UR via the TF approach on 04/17/22 by Dr. Excell Seltzer and Dr. Laneta Simmers. Post op echo showed EF 60-65%, normally functioning TAVR with a mean gradient of 12.2 mmHg and no PVL. He was discharged on a baby aspirin.   The patient's TAVR was initially uncomplicated, but he developed an increase in his mean transvalvular gradient on follow-up echo from 12 mmHg to 23 mmHg. A CTA of the heart confirmed subacute leaflet thrombosis and he was started on apixaban. He has done well with this with improvement in his gradients back to baseline. The patient subsequently was found to have a pulmonary embolism and it has been recommended that he remain on  long-term, low-dose apixaban. He is followed by hematology. Follow up CTA showed marked improvement in HALT/HAM and leaflet motion.  Echo 11/2022 showed EF 50-55% and stable TAVR with mean gradient of 12 mm hg. Seen by Dr. Excell Seltzer in 12/2022. Doing well but BP elevated and started on Norvasc 5mg  daily. He was later switched from Eliquis 5mg  BID to 2.5mg  BID given issues with bruising and bleeding while at work.   Today the patient presents to clinic for follow up. Here alone. No CP or SOB. No LE edema, orthopnea or PND. No dizziness or syncope. No blood in stool or urine. No palpitations. Back to working as a Curator. Still smoking a PPD.    Past Medical History:  Diagnosis Date   CAD (coronary artery disease)    Nonobstructive by cardiac catheterization 2021 [facility] cardiology   Cardiomyopathy [facility])    Colon cancer [facility])    Metastatic to liver and lymph nodes status post surgery and chemotherapy 2005   Erectile dysfunction    Essential hypertension    History of pneumonia    Incisional hernia    Mixed hyperlipidemia    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 Excell Seltzer & Dr. Laneta Simmers   Severe aortic stenosis  Past Surgical History:  Procedure Laterality Date   COLON SURGERY     COLOSTOMY     COLOSTOMY REVERSAL     HERNIA REPAIR     INTRAOPERATIVE TRANSTHORACIC ECHOCARDIOGRAM N/A 04/17/2022   Procedure: INTRAOPERATIVE TRANSTHORACIC ECHOCARDIOGRAM;  Surgeon: Tonny Bollman, MD;  Location: [facility] OR;  Service: Open Heart Surgery;  Laterality: N/A;   LEFT HEART CATH AND CORONARY ANGIOGRAPHY N/A 04/09/2022   Procedure: LEFT HEART CATH AND CORONARY ANGIOGRAPHY;  Surgeon: Orbie Pyo, MD;  Location: [facility] INVASIVE CV LAB;  Service: Cardiovascular;  Laterality: N/A;   LIVER RESECTION     TRANSCATHETER AORTIC VALVE REPLACEMENT, TRANSFEMORAL N/A 04/17/2022   Procedure: Transcatheter Aortic Valve Replacement,  Transfemoral;  Surgeon: Tonny Bollman, MD;  Location: [facility] OR;  Service: Open Heart Surgery;  Laterality: N/A;    Current Medications: Current Meds  Medication Sig   albuterol (VENTOLIN HFA) 108 (90 Base) MCG/ACT inhaler Inhale 2 puffs into the lungs every 4 (four) hours as needed for wheezing or shortness of breath.   amLODipine (NORVASC) 5 MG tablet Take 1 tablet (5 mg total) by mouth daily.   apixaban (ELIQUIS) 2.5 MG TABS tablet Take 1 tablet (2.5 mg total) by mouth 2 (two) times daily.   aspirin-acetaminophen-caffeine (EXCEDRIN MIGRAINE) 250-250-65 MG tablet Take by mouth as needed for headache.   atorvastatin (LIPITOR) 40 MG tablet Take 1 tablet (40 mg total) by mouth daily.   cholecalciferol (VITAMIN D3) 25 MCG (1000 UNIT) tablet Take 1,000 Units by mouth daily.   Cyanocobalamin (B-12 PO) Take 1 capsule by mouth daily.   Fluticasone-Umeclidin-Vilant (TRELEGY ELLIPTA) 100-62.5-25 MCG/ACT AEPB Inhale 1 puff into the lungs daily.   ibuprofen (ADVIL) 800 MG tablet Take 800 mg by mouth daily as needed for headache.   icosapent Ethyl (VASCEPA) 1 g capsule Take 2 capsules (2 g total) by mouth 2 (two) times daily.   metoprolol succinate (TOPROL XL) 25 MG 24 hr tablet Take 0.5 tablets (12.5 mg total) by mouth daily.   senna-docusate (SENNA S) 8.6-50 MG tablet Take 1-2 tablets by mouth See admin instructions. Take 2 tablets in the morning and 2 tablet at night   traMADol (ULTRAM) 50 MG tablet Take 1 tablet (50 mg total) by mouth every 6 (six) hours as needed.     Allergies:   Tiotropium bromide monohydrate   Social History   Socioeconomic History   Marital status: Married    Spouse name: Not on file   Number of children: Not on file   Years of education: Not on file   Highest education level: Not on file  Occupational History   Not on file  Tobacco Use   Smoking status: Every Day    Current packs/day: 1.50    Average packs/day: 1.5 packs/day for 45.0 years (67.5 ttl pk-yrs)     Types: Cigarettes   Smokeless tobacco: Never  Substance and Sexual Activity   Alcohol use: Yes    Comment: Occasional   Drug use: No   Sexual activity: Not on file  Other Topics Concern   Not on file  Social History Narrative   Not on file   Social Determinants of Health   Financial Resource Strain: Not on file  Food Insecurity: Not on file  Transportation Needs: Not on file  Physical Activity: Not on file  Stress: Not on file  Social Connections: Not on file     Family History: The patient's family history includes AAA (abdominal aortic aneurysm) in his mother; Cancer in  his father.  ROS:   Please see the history of present illness.    All other systems reviewed and are negative.  EKGs/Labs/Other Studies Reviewed:    Cardiac Studies & Procedures   CARDIAC CATHETERIZATION  CARDIAC CATHETERIZATION 04/09/2022  Narrative 1.  Mild obstructive coronary artery disease. 2.  Severely elevated LVEDP of 29 mmHg. 3.  Heavily calcified aortic valve with a mean gradient of 52 mmHg and a peak to peak gradient of 62 mmHg consistent with severe aortic stenosis; aortic waveforms demonstrated parvus and tardus. 4.  Preserved left ventricular ejection fraction.  Recommendation: Echocardiogram to evaluate for severe aortic stenosis which will inform therapy moving forward.  Findings Coronary Findings Diagnostic  Dominance: Right  Left Anterior Descending There is mild diffuse disease throughout the vessel.  Right Coronary Artery There is moderate diffuse disease throughout the vessel.  Intervention  No interventions have been documented.     ECHOCARDIOGRAM  ECHOCARDIOGRAM COMPLETE 04/15/2023  Narrative ECHOCARDIOGRAM REPORT    Patient Name:   MALACAI OLM     Date of Exam: 04/15/2023 Medical Rec #:  [id number]     Height:       71.5 in Accession #:    [id number]    Weight:       208.0 lb Date of Birth:  Jan 10, 1966      BSA:          2.155 m Patient Age:    57 years       BP:           130/100 mmHg Patient Gender: M             HR:           68 bpm. Exam Location:  [address]  Procedure: 2D Echo, Cardiac Doppler and Color Doppler  Indications:    Z95.2 S/p TAVR  History:        Patient has prior history of Echocardiogram examinations, most recent 12/16/2022. CAD and Previous Myocardial Infarction, S/p TAVR (26mm Edwards Sapien 3UR); Risk Factors:Hypertension, Dyslipidemia and Obesity. Aortic Valve: 26 mm Sapien prosthetic, stented (TAVR) valve is present in the aortic position.  Sonographer:    Samule Ohm RDCS Referring Phys: [id number] Morley Gaumer R Alahna Dunne  IMPRESSIONS   1. Left ventricular ejection fraction, by estimation, is 60 to 65%. The left ventricle has normal function. The left ventricle has no regional wall motion abnormalities. Left ventricular diastolic parameters are consistent with Grade I diastolic dysfunction (impaired relaxation). 2. Right ventricular systolic function is normal. The right ventricular size is normal. 3. The mitral valve is normal in structure. No evidence of mitral valve regurgitation. No evidence of mitral stenosis. 4. Mean TAVR gradient 21 mmHg, increased from 12 mmHg 11/2022. The aortic valve has been repaired/replaced. Aortic valve regurgitation is trivial. Mild to moderate aortic valve stenosis. There is a 26 mm Sapien prosthetic (TAVR) valve present in the aortic position. Aortic valve area, by VTI measures 0.83 cm. Aortic valve mean gradient measures 21.0 mmHg. Aortic valve Vmax measures 2.13 m/s. 5. The inferior vena cava is normal in size with greater than 50% respiratory variability, suggesting right atrial pressure of 3 mmHg.  FINDINGS Left Ventricle: Left ventricular ejection fraction, by estimation, is 60 to 65%. The left ventricle has normal function. The left ventricle has no regional wall motion abnormalities. The left ventricular internal cavity size was normal in size. There is no left ventricular  hypertrophy. Left ventricular diastolic parameters are consistent with Grade I diastolic dysfunction (impaired  relaxation).  Right Ventricle: The right ventricular size is normal. No increase in right ventricular wall thickness. Right ventricular systolic function is normal.  Left Atrium: Left atrial size was normal in size.  Right Atrium: Right atrial size was normal in size.  Pericardium: There is no evidence of pericardial effusion.  Mitral Valve: The mitral valve is normal in structure. No evidence of mitral valve regurgitation. No evidence of mitral valve stenosis.  Tricuspid Valve: The tricuspid valve is normal in structure. Tricuspid valve regurgitation is not demonstrated. No evidence of tricuspid stenosis.  Aortic Valve: Mean TAVR gradient 21 mmHg, increased from 12 mmHg 11/2022. The aortic valve has been repaired/replaced. Aortic valve regurgitation is trivial. Mild to moderate aortic stenosis is present. Aortic valve mean gradient measures 21.0 mmHg. Aortic valve peak gradient measures 18.1 mmHg. Aortic valve area, by VTI measures 0.83 cm. There is a 26 mm Sapien prosthetic, stented (TAVR) valve present in the aortic position.  Pulmonic Valve: The pulmonic valve was normal in structure. Pulmonic valve regurgitation is not visualized. No evidence of pulmonic stenosis.  Aorta: The aortic root is normal in size and structure.  Venous: The inferior vena cava is normal in size with greater than 50% respiratory variability, suggesting right atrial pressure of 3 mmHg.  IAS/Shunts: No atrial level shunt detected by color flow Doppler.   LEFT VENTRICLE PLAX 2D LVIDd:         5.00 cm LVIDs:         3.70 cm LV PW:         1.20 cm LV IVS:        1.00 cm LVOT diam:     2.00 cm LV SV:         55 LV SV Index:   26 LVOT Area:     3.14 cm   IVC IVC diam: 0.70 cm  LEFT ATRIUM             Index        RIGHT ATRIUM           Index LA diam:        3.40 cm 1.58 cm/m   RA Pressure:  3.00 mmHg LA Vol (A2C):   52.9 ml 24.55 ml/m  RA Area:     13.80 cm LA Vol (A4C):   45.6 ml 21.16 ml/m  RA Volume:   37.10 ml  17.21 ml/m LA Biplane Vol: 52.1 ml 24.17 ml/m AORTIC VALVE AV Area (Vmax):    1.21 cm AV Area (Vmean):   1.01 cm AV Area (VTI):     0.83 cm AV Vmax:           213.00 cm/s AV Vmean:          181.400 cm/s AV VTI:            0.667 m AV Peak Grad:      18.1 mmHg AV Mean Grad:      21.0 mmHg LVOT Vmax:         82.06 cm/s LVOT Vmean:        58.260 cm/s LVOT VTI:          0.175 m LVOT/AV VTI ratio: 0.26  AORTA Ao Root diam: 3.30 cm Ao Asc diam:  3.20 cm  MITRAL VALVE               TRICUSPID VALVE MV Area (PHT): 2.66 cm    Estimated RAP:  3.00 mmHg MV Decel Time: 285 msec MV E  velocity: 75.56 cm/s  SHUNTS MV A velocity: 84.48 cm/s  Systemic VTI:  0.18 m MV E/A ratio:  0.89        Systemic Diam: 2.00 cm  Chilton Si MD Electronically signed by Chilton Si MD Signature Date/Time: 04/15/2023/2:53:48 PM    Final     CT SCANS  CT CORONARY MORPH W/CTA COR W/SCORE 10/01/2022  Addendum 10/02/2022  9:13 PM ADDENDUM REPORT: 10/02/2022 21:10  EXAM: OVER-READ INTERPRETATION  CT CHEST  The following report is an over-read performed by radiologist Dr. Noe Gens [facility] on 10/02/2022. This over-read does not include interpretation of cardiac or coronary anatomy or pathology. The coronary CTA interpretation by the cardiologist is attached.  COMPARISON:  06/02/2022  FINDINGS: Heart is normal size. Aorta normal caliber. Scattered calcifications. Prior TAVR. Calcified subcarinal and right paratracheal lymph nodes as well as right hilar lymph nodes. Linear scarring in the lung bases. No acute confluent opacity or effusion. No acute findings in the upper abdomen. Chest wall soft tissues are unremarkable. No acute bony abnormality.  IMPRESSION: No acute extra cardiac abnormality.  Old granulomatous disease.  Scattered  aortic atherosclerosis.   Electronically Signed By: Charlett Nose M.D. On: 10/02/2022 21:10  Narrative CLINICAL DATA:  Post TAVR  EXAM: Cardiac CTA  TECHNIQUE: Gated cardiac CTA performed with no beta blocker or nitro for post TAVR valve evaluation Retrospective exam done to evaluate leaflet motion  Comparison CT done 06/02/22 Patient received 80 cc contrast Scanned at 120 kV with average HR of 58 bpm  FINDINGS: Calcium Score: LM/3 vessel involvement  LM 67.6  LAD 345  LCX 85.7  RCA 412  Total 910  Aorta: No aneurysm. Ascending thoracic aorta 3.4 cm Mild calcific atherosclerosis Arch vessels not visualized  The is a 26 mm Sapien 3 stented TAVR valve in good position. No peri valvular leak Compared to CTA done 06/02/22 leaflet motion is improved. The previously described severe HALT/HAM is also  Much improved which corresponds to improved gradients on recent echo. There is still some hypo-attenuation that the base of each leaflet commissure  IMPRESSION: 1. LM/ 3 vessel calcium with score 910 which is 99 th percentile for age/sex  2.  Normal ascending thoracic aorta 3.4 cm  3. Marked improvement in HALT/HAM on valve leaflets with improved leaflet motion Still with mild hypo attenuation at base of each commissure Consider 3 more months of anticoagulation and then  Follow gradients by echo and only repeat CTA if gradients go back up after stopping DOAC  Charlton Haws  Electronically Signed: By: Charlton Haws M.D. On: 10/01/2022 10:30   CT SCANS  CT CORONARY MORPH W/CTA COR W/SCORE 06/02/2022  Addendum 06/02/2022  3:20 PM ADDENDUM REPORT: 06/02/2022 15:18  CLINICAL DATA:  S/p TAVR. Increased gradients across the prosthesis. 26 mm S3 04/17/2022.  EXAM: Cardiac TAVR CT  TECHNIQUE: A non-contrast, gated CT scan was obtained with axial slices of 3 mm through the heart for aortic valve calcium scoring. A 120 kV retrospective, gated, contrast cardiac  scan was obtained. Gantry rotation speed was 250 msecs and collimation was 0.6 mm. Nitroglycerin was not given. The 3D data set was reconstructed in 5% intervals of the 0-95% of the R-R cycle. Systolic and diastolic phases were analyzed on a dedicated workstation using MPR, MIP, and VRT modes. The patient received 100 cc of contrast.  FINDINGS: Image quality: Excellent.  Noise artifact is: Limited.  Aortic valve prosthesis: A 26 mm S3 TAVR is present. There is  severe HALT with HAM of the leaflets of the NCC/LCC. The HALT covers all of these leaflets. There is HALT at the base of the leaflet of the RCC. There is no evidence of paravalvular leak. This is consistent with leaflet thrombosis.  Coronary Arteries: Normal coronary origin. Right dominance. The study was performed without use of NTG and is insufficient for plaque evaluation. Please refer to recent cardiac catheterization for coronary assessment. 3-vessel coronary calcifications noted.  Cardiac Morphology:  Right Atrium: Right atrial size is within normal limits.  Right Ventricle: The right ventricular cavity is within normal limits.  Left Atrium: Left atrial size is normal in size with no left atrial appendage filling defect.  Left Ventricle: The ventricular cavity size is within normal limits.  Pulmonary arteries: Normal in size.  Pulmonary veins: Normal pulmonary venous drainage.  Pericardium: Normal thickness with no significant effusion or calcium present.  Mitral Valve: The mitral valve is normal structure without significant calcification.  Aorta: Normal caliber.  Extra-cardiac findings: See attached radiology report for non-cardiac structures.  IMPRESSION: 1. 26 mm S3 with severe HALT/HAM consistent with leaflet thrombosis.  2. 3-vessel coronary calcifications.  Gerri Spore T. Flora Lipps, MD   Electronically Signed By: Lennie Odor M.D. On: 06/02/2022 15:18  Narrative EXAM: OVER-READ  INTERPRETATION  CT CHEST  The following report is a limited chest CT over-read performed by radiologist Dr. Allegra Lai of [facility] on 06/02/2022. This over-read does not include interpretation of cardiac or coronary anatomy or pathology. The coronary CTA interpretation by the cardiologist is attached.  COMPARISON:  Cardiac CT dated April 10, 2022  FINDINGS: Vascular: Acute pulmonary embolus of the distal right pulmonary artery with extension into the right upper lobe lobar artery, finding is new when compared with prior heart CT dated April 10, 2022. No CT evidence of right heart strain (RV/LV ratio of 0.7). Interval transcatheter aortic valve replacement. Normal caliber thoracic aorta with mild atherosclerotic disease.  Mediastinum/Nodes: Esophagus is unremarkable. No pathologically enlarged lymph nodes seen in the visualized chest. Calcified mediastinal lymph nodes, likely sequela of prior granulomatous infection  Lungs/Pleura: Trace right pleural effusion and right basilar atelectasis. Linear opacities of the lower right lung, likely due to scarring or atelectasis. Calcified granulomas of the right lower lobe.  Upper Abdomen: No acute abnormality.  Musculoskeletal: No chest wall mass or suspicious bone lesions identified.  IMPRESSION: 1. Acute pulmonary embolus of the distal right pulmonary artery with extension into the right upper lobe lobar artery. No CT evidence of right heart strain. 2. Trace right pleural effusion and right basilar atelectasis. 3. Aortic Atherosclerosis (ICD10-I70.0).  Critical Value/emergent results were called by telephone at the time of interpretation on 06/02/2022 at 1:11 pm to provider Georgie Chard, NP, who verbally acknowledged these results.  Electronically Signed: By: Allegra Lai M.D. On: 06/02/2022 13:14   CT SCANS  CT CORONARY MORPH W/CTA COR W/SCORE 04/10/2022  Addendum 04/10/2022 10:12 PM ADDENDUM  REPORT: 04/10/2022 22:10  CLINICAL DATA:  18 -year-old male with severe aortic stenosis being evaluated for a TAVR procedure.  EXAM: Cardiac TAVR CT  TECHNIQUE: The patient was scanned on a [employer]. A 120 kV retrospective scan was triggered in the descending thoracic aorta at 111 HU's. Gantry rotation speed was 250 msecs and collimation was .6 mm. No beta blockade or nitro were given. The 3D data set was reconstructed in 5% intervals of the R-R cycle. Systolic and diastolic phases were analyzed on a dedicated work station using MPR,  MIP and VRT modes. The patient received 100 cc of contrast.  FINDINGS: Aortic Root:  Aortic valve: Bicuspid aortic valve with fusion of right and noncoronary cusps  Aortic valve calcium score: 4864  Aortic annulus:  Diameter: 28mm x 28mm  Perimeter: 86mm  Area: 579 mm^2  Calcifications: Moderate calcification adjacent to right coronary cusp  Coronary height: Min Left - 14mm, Max Left - 21mm; Min Right - 14mm  Sinotubular height: Left cusp - 25mm; Right cusp - 20mm; Noncoronary cusp - 22mm  LVOT (as measured 3 mm below the annulus):  Diameter: 30mm x 26mm  Area: 696mm^2  Calcifications: Moderate calcification inferior to RCC  Aortic sinus width: Left cusp - 35mm; Right cusp - 31mm; Noncoronary cusp - 31mm  Sinotubular junction width: 31mm x 29mm  Optimum Fluoroscopic Angle for Delivery: RAO 3 CRA 9  Cardiac:  Right atrium: Normal size  Right ventricle: Normal size  Pulmonary arteries: Normal size  Pulmonary veins: Normal configuration  Left atrium: Mild enlargement  Left ventricle: Normal size  Pericardium: Normal thickness  Coronary arteries: Coronary calcium score 757 (98th percentile)  IMPRESSION: 1. Bicuspid aortic valve with fusion of right and noncoronary cusps. Severe calcifications (AV calcium score 4864)  2. Aortic annulus measures 28mm x 28mm with perimeter 86mm and area 579 mm^2.  Moderate annular calcifications adjacent to right coronary cusp and extending into LVOT. Annular measurements are suitable for delivery of 29mm Edwards Sapien 3 valve  3. Sufficient coronary to annulus distance, measuring 14mm to left main and 14mm to RCA  4. Optimum Fluoroscopic Angle for Delivery:  RAO 3 CRA 9  5. Coronary calcium score 757 (98th percentile)   Electronically Signed By: Epifanio Lesches M.D. On: 04/10/2022 22:10  Narrative EXAM: OVER-READ INTERPRETATION  CT CHEST  The following report is a limited chest CT over-read performed by radiologist Dr. Allegra Lai of [facility] on 04/10/2022. This over-read does not include interpretation of cardiac or coronary anatomy or pathology. The cardiac TAVR interpretation by the cardiologist is attached.  FINDINGS: Extracardiac findings will be described separately under dictation for contemporaneously obtained CTA chest, abdomen and pelvis.  IMPRESSION: Please see separate dictation for contemporaneously obtained CTA chest, abdomen and pelvis dated 04/10/2022 for full description of relevant extracardiac findings.  Electronically Signed: By: Allegra Lai M.D. On: 04/10/2022 15:48          EKG:  EKG is NOT ordered today.    Recent Labs: 04/18/2022: Magnesium 1.9 11/18/2022: ALT 21; BUN 12; Creatinine 1.17; Hemoglobin 17.6; Platelet Count 74; Potassium 4.3; Sodium 141  Recent Lipid Panel    Component Value Date/Time   CHOL 126 04/10/2022 0142   TRIG 309 (H) 04/10/2022 0142   HDL 36 (L) 04/10/2022 0142   CHOLHDL 3.5 04/10/2022 0142   VLDL 62 (H) 04/10/2022 0142   LDLCALC 28 04/10/2022 0142     Risk Assessment/Calculations:            Physical Exam:    VS:  BP 120/70   Pulse 62   Ht 5' 11.5" (1.816 m)   Wt 211 lb 9.6 oz (96 kg)   SpO2 99%   BMI 29.10 kg/m     Wt Readings from Last 3 Encounters:  04/15/23 211 lb 9.6 oz (96 kg)  02/18/23 208 lb (94.3 kg)  12/24/22 214  lb 3.2 oz (97.2 kg)     GEN:  Well nourished, well developed in no acute distress HEENT: Normal NECK: No JVD LYMPHATICS: No lymphadenopathy  CARDIAC: RRR, 2/6 SEM. No rubs, gallops RESPIRATORY:  Clear to auscultation without rales, wheezing or rhonchi  ABDOMEN: Soft, non-tender, non-distended MUSCULOSKELETAL:  No edema; No deformity  SKIN: Warm and dry NEUROLOGIC:  Alert and oriented x 3 PSYCHIATRIC:  Normal affect   ASSESSMENT:    1. S/P TAVR (transcatheter aortic valve replacement)   2. Heart failure with improved ejection fraction (HFimpEF) (HCC)   3. Essential hypertension   4. Hyperlipidemia, unspecified hyperlipidemia type   5. Chronic obstructive pulmonary disease, unspecified COPD type (HCC)   6. Tobacco abuse   7. Abdominal aortic aneurysm (AAA) without rupture, unspecified part (HCC)   8. History of pulmonary embolism    PLAN:    In order of problems listed above:  Severe AS s/p TAVR: echo today shows EF 60%, normally functioning TAVR with a mean gradient of 21 mm hg (up from 12 mmhg) and no PVL. He has a history of subclinical leaflet thrombosis and pulmonary embolus and was treated with Eliquis. Dose recently reduced from 5mg  to 2.5mg  BID due to pt preference (easy bleeding/bruising). Given reduction of Eliquis and increase in gradients, I will follow his echo closely. If elevated gradients worsen, will get him set up for a repeat cardiac CT. Fortunately, he is doing well with NYHA class I symptoms. Has amoxicillin for SBE prophylaxis. Continue Eliquis 2.5mg  BID at current dosing. I will see him back in 6 months with an echo.    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 He has no s/s of CHF. Continue on Toprol XL 12.5mg  daily.    HTN: Bp well controlled on Toprol XL 12.5mg  daily and Norvasc 5mg  BID   HLD: continue Atorvastatin 40mg  daily and Vascepa 2g twice daily.   COPD/Tobacco Abuse: PFTs with severe COPD. Counseled on cessation. Nicotine patches haven't helped him    AAA: pre TAVR CTs showed a 3.1 cm infrarenal abdominal aortic aneurysm. Recommended [place] q3 years. This will be followed over time.   Hx of PE: continue Eliquis as above.    Medication Adjustments/Labs and Tests Ordered: Current medicines are reviewed at length with the patient today.  Concerns regarding medicines are outlined above.  Orders Placed This Encounter  Procedures   ECHOCARDIOGRAM COMPLETE   No orders of the defined types were placed in this encounter.   Patient Instructions  Medication Instructions:  Your physician recommends that you continue on your current medications as directed. Please refer to the Current Medication list given to you today.  *If you need a refill on your cardiac medications before your next appointment, please call your pharmacy*   Lab Work: None ordered   If you have labs (blood work) drawn today and your tests are completely normal, you will receive your results only by: MyChart Message (if you have MyChart) OR A paper copy in the mail If you have any lab test that is abnormal or we need to change your treatment, we will call you to review the results.   Testing/Procedures: Your physician has requested that you have an echocardiogram. Echocardiography is a painless test that uses sound waves to create images of your heart. It provides your doctor with information about the size and shape of your heart and how well your heart's chambers and valves are working. This procedure takes approximately one hour. There are no restrictions for this procedure. Please do NOT wear cologne, perfume, aftershave, or lotions (deodorant is allowed). Please arrive 15 minutes prior to your appointment time.    Follow-Up: Follow up  as scheduled   Other Instructions     Signed, Cline Crock, PA-C  04/15/2023 3:21 PM    [facility]

## 2023-04-15 ENCOUNTER — Ambulatory Visit (HOSPITAL_BASED_OUTPATIENT_CLINIC_OR_DEPARTMENT_OTHER): Payer: BC Managed Care – PPO

## 2023-04-15 ENCOUNTER — Ambulatory Visit (HOSPITAL_COMMUNITY): Payer: BC Managed Care – PPO | Attending: Cardiology | Admitting: Physician Assistant

## 2023-04-15 VITALS — BP 120/70 | HR 62 | Ht 71.5 in | Wt 211.6 lb

## 2023-04-15 DIAGNOSIS — I5032 Chronic diastolic (congestive) heart failure: Secondary | ICD-10-CM | POA: Diagnosis not present

## 2023-04-15 DIAGNOSIS — Z952 Presence of prosthetic heart valve: Secondary | ICD-10-CM | POA: Diagnosis not present

## 2023-04-15 DIAGNOSIS — J449 Chronic obstructive pulmonary disease, unspecified: Secondary | ICD-10-CM | POA: Diagnosis not present

## 2023-04-15 DIAGNOSIS — I714 Abdominal aortic aneurysm, without rupture, unspecified: Secondary | ICD-10-CM | POA: Insufficient documentation

## 2023-04-15 DIAGNOSIS — I10 Essential (primary) hypertension: Secondary | ICD-10-CM | POA: Diagnosis not present

## 2023-04-15 DIAGNOSIS — Z86711 Personal history of pulmonary embolism: Secondary | ICD-10-CM | POA: Diagnosis not present

## 2023-04-15 DIAGNOSIS — E785 Hyperlipidemia, unspecified: Secondary | ICD-10-CM | POA: Diagnosis not present

## 2023-04-15 DIAGNOSIS — Z720 Tobacco use: Secondary | ICD-10-CM | POA: Insufficient documentation

## 2023-04-15 LAB — ECHOCARDIOGRAM COMPLETE
AR max vel: 1.21 cm2
AV Area VTI: 0.83 cm2
AV Area mean vel: 1.01 cm2
AV Mean grad: 21 mmHg
AV Peak grad: 18.1 mmHg
Ao pk vel: 2.13 m/s
Area-P 1/2: 2.66 cm2
S' Lateral: 3.7 cm

## 2023-04-15 NOTE — Patient Instructions (Signed)
Medication Instructions:  Your physician recommends that you continue on your current medications as directed. Please refer to the Current Medication list given to you today.  *If you need a refill on your cardiac medications before your next appointment, please call your pharmacy*   Lab Work: None ordered   If you have labs (blood work) drawn today and your tests are completely normal, you will receive your results only by: MyChart Message (if you have MyChart) OR A paper copy in the mail If you have any lab test that is abnormal or we need to change your treatment, we will call you to review the results.   Testing/Procedures: Your physician has requested that you have an echocardiogram. Echocardiography is a painless test that uses sound waves to create images of your heart. It provides your doctor with information about the size and shape of your heart and how well your heart's chambers and valves are working. This procedure takes approximately one hour. There are no restrictions for this procedure. Please do NOT wear cologne, perfume, aftershave, or lotions (deodorant is allowed). Please arrive 15 minutes prior to your appointment time.     Follow-Up: Follow up as scheduled   Other Instructions

## 2023-05-01 ENCOUNTER — Telehealth: Payer: Self-pay

## 2023-05-01 ENCOUNTER — Other Ambulatory Visit (HOSPITAL_COMMUNITY): Payer: Self-pay

## 2023-05-01 NOTE — Telephone Encounter (Signed)
Pharmacy Patient Advocate Encounter  Received notification from [facility] that Prior Authorization for ICOSAPENT has been APPROVED from 05/01/23 to 04/30/24. Ran test claim, Copay is $10. This test claim was processed through [facility]- copay amounts may vary at other pharmacies due to pharmacy/plan contracts, or as the patient moves through the different stages of their insurance plan.

## 2023-05-01 NOTE — Telephone Encounter (Signed)
Pharmacy Patient Advocate Encounter   Received notification from CoverMyMeds that prior authorization for ICOSAPENT is required/requested.   Insurance verification completed.   The patient is insured through [facility] .   Per test claim: PA required; PA submitted to [facility] via CoverMyMeds Key/confirmation #/EOC [id number] Status is pending

## 2023-05-15 ENCOUNTER — Other Ambulatory Visit: Payer: Self-pay | Admitting: Physician Assistant

## 2023-05-28 ENCOUNTER — Inpatient Hospital Stay: Payer: BC Managed Care – PPO | Admitting: Hematology and Oncology

## 2023-05-28 ENCOUNTER — Other Ambulatory Visit: Payer: Self-pay | Admitting: Hematology and Oncology

## 2023-05-28 ENCOUNTER — Inpatient Hospital Stay: Payer: BC Managed Care – PPO | Attending: Hematology and Oncology

## 2023-05-28 VITALS — BP 128/82 | HR 64 | Temp 98.1°F | Resp 13 | Wt 210.2 lb

## 2023-05-28 DIAGNOSIS — Z85038 Personal history of other malignant neoplasm of large intestine: Secondary | ICD-10-CM | POA: Diagnosis not present

## 2023-05-28 DIAGNOSIS — D696 Thrombocytopenia, unspecified: Secondary | ICD-10-CM

## 2023-05-28 DIAGNOSIS — F17210 Nicotine dependence, cigarettes, uncomplicated: Secondary | ICD-10-CM | POA: Insufficient documentation

## 2023-05-28 DIAGNOSIS — I2699 Other pulmonary embolism without acute cor pulmonale: Secondary | ICD-10-CM | POA: Diagnosis not present

## 2023-05-28 LAB — CMP (CANCER CENTER ONLY)
ALT: 25 U/L (ref 0–44)
AST: 25 U/L (ref 15–41)
Albumin: 4.1 g/dL (ref 3.5–5.0)
Alkaline Phosphatase: 92 U/L (ref 38–126)
Anion gap: 6 (ref 5–15)
BUN: 13 mg/dL (ref 6–20)
CO2: 29 mmol/L (ref 22–32)
Calcium: 9.5 mg/dL (ref 8.9–10.3)
Chloride: 107 mmol/L (ref 98–111)
Creatinine: 1.18 mg/dL (ref 0.61–1.24)
GFR, Estimated: >60 mL/min (ref >60)
Glucose, Bld: 85 mg/dL (ref 70–99)
Potassium: 4.1 mmol/L (ref 3.5–5.1)
Sodium: 142 mmol/L (ref 135–145)
Total Bilirubin: 0.9 mg/dL (ref 0.3–1.2)
Total Protein: 7 g/dL (ref 6.5–8.1)

## 2023-05-28 LAB — CBC WITH DIFFERENTIAL (CANCER CENTER ONLY)
Abs Immature Granulocytes: 0.02 10*3/uL (ref 0.00–0.07)
Basophils Absolute: 0 10*3/uL (ref 0.0–0.1)
Basophils Relative: 0 %
Eosinophils Absolute: 0.1 10*3/uL (ref 0.0–0.5)
Eosinophils Relative: 2 %
HCT: 53 % — ABNORMAL HIGH (ref 39.0–52.0)
Hemoglobin: 17.6 g/dL — ABNORMAL HIGH (ref 13.0–17.0)
Immature Granulocytes: 0 %
Lymphocytes Relative: 35 %
Lymphs Abs: 2.7 10*3/uL (ref 0.7–4.0)
MCH: 31.4 pg (ref 26.0–34.0)
MCHC: 33.2 g/dL (ref 30.0–36.0)
MCV: 94.5 fL (ref 80.0–100.0)
Monocytes Absolute: 0.8 10*3/uL (ref 0.1–1.0)
Monocytes Relative: 10 %
Neutro Abs: 4.1 10*3/uL (ref 1.7–7.7)
Neutrophils Relative %: 53 %
Platelet Count: 62 10*3/uL — ABNORMAL LOW (ref 150–400)
RBC: 5.61 MIL/uL (ref 4.22–5.81)
RDW: 13.7 % (ref 11.5–15.5)
WBC Count: 7.8 10*3/uL (ref 4.0–10.5)
nRBC: 0 % (ref 0.0–0.2)

## 2023-05-28 NOTE — Progress Notes (Signed)
[facility] Telephone:([phone]) [phone]   Fax:([phone]  PROGRESS NOTE  Patient Care Team: Wilmon Pali, FNP as PCP - General (Family Medicine) Tonny Bollman, MD as PCP - Cardiology (Cardiology)  Hematological/Oncological History # Acute Pulmonary Embolism 04/17/2022: underwent TAVR after presenting with an NSTEMI and work-up revealed severe aortic stenosis.  06/02/2022: underwent cardiac CT did reveal a leaflet thrombosis along with an acute pulmonary embolus of the distal right pulmonary artery with extension into the right upper lobe lobar artery.  Patient was initiated on Eliquis therapy  06/25/2022: Establish care with [facility] health hematology.  Interval History:  Bob Ross 57 y.o. male with medical history significant for pulmonary embolism/thrombocytopenia who presents for a follow up visit. The patient's last visit was on 11/26/2022. In the interim since the last visit he has continued on Eliquis therapy.  On exam today Bob Ross reports he has been well overall in the interim since her last visit.  He reports that things have been improving since he switched over to the 2.5 mg Eliquis.  He notes a marked decrease in easy bleeding and bruising.  He reports he can also clot more quickly if he cuts his hands or fingers.  He reports the medication price is reasonable pang about $30 for 70-month supply.  He notes that he did stop ibuprofen as a result of the anticoagulation.  His platelets are at 54 and he is not interested in any further evaluation such as a liver ultrasound.  He reports that he feels that prior imaging of his liver from 2023 is adequate.  He notes he is had no hospitalizations or emergency room visits.  Otherwise he has been his baseline level of health and has no questions concerns or complaints today.  He notes that he feels well and denies any fevers, chills, sweats, nausea, vomiting or diarrhea.  A full 10 point ROS is otherwise negative.  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 Excell Seltzer & Dr. Laneta Simmers   Severe aortic stenosis     SURGICAL HISTORY: Past Surgical History:  Procedure Laterality Date   COLON SURGERY     COLOSTOMY     COLOSTOMY REVERSAL     HERNIA REPAIR     INTRAOPERATIVE TRANSTHORACIC ECHOCARDIOGRAM N/A 04/17/2022   Procedure: INTRAOPERATIVE TRANSTHORACIC ECHOCARDIOGRAM;  Surgeon: Tonny Bollman, MD;  Location: [facility] OR;  Service: Open Heart Surgery;  Laterality: N/A;   LEFT HEART CATH AND CORONARY ANGIOGRAPHY N/A 04/09/2022   Procedure: LEFT HEART CATH AND CORONARY ANGIOGRAPHY;  Surgeon: Orbie Pyo, MD;  Location: [facility] INVASIVE CV LAB;  Service: Cardiovascular;  Laterality: N/A;   LIVER RESECTION     TRANSCATHETER AORTIC VALVE REPLACEMENT, TRANSFEMORAL N/A 04/17/2022   Procedure: Transcatheter Aortic Valve Replacement, Transfemoral;  Surgeon: Tonny Bollman, MD;  Location: [facility] OR;  Service: Open Heart Surgery;  Laterality: N/A;    SOCIAL HISTORY: Social History   Socioeconomic History   Marital status: Married  Spouse name: Not on file   Number of children: Not on file   Years of education: Not on file   Highest education level: Not on file  Occupational History   Not on file  Tobacco Use   Smoking status: Every Day    Current packs/day: 1.50    Average packs/day: 1.5 packs/day for 45.0 years (67.5 ttl pk-yrs)    Types: Cigarettes   Smokeless tobacco: Never  Substance and Sexual Activity   Alcohol use: Yes    Comment:  Occasional   Drug use: No   Sexual activity: Not on file  Other Topics Concern   Not on file  Social History Narrative   Not on file   Social Determinants of Health   Financial Resource Strain: Not on file  Food Insecurity: Not on file  Transportation Needs: Not on file  Physical Activity: Not on file  Stress: Not on file  Social Connections: Not on file  Intimate Partner Violence: Not on file    FAMILY HISTORY: Family History  Problem Relation Age of Onset   AAA (abdominal aortic aneurysm) Mother    Cancer Father     ALLERGIES:  is allergic to tiotropium bromide monohydrate.  MEDICATIONS:  Current Outpatient Medications  Medication Sig Dispense Refill   albuterol (VENTOLIN HFA) 108 (90 Base) MCG/ACT inhaler Inhale 2 puffs into the lungs every 4 (four) hours as needed for wheezing or shortness of breath. 1 each 3   amLODipine (NORVASC) 5 MG tablet Take 1 tablet (5 mg total) by mouth daily. 90 tablet 3   apixaban (ELIQUIS) 2.5 MG TABS tablet Take 1 tablet (2.5 mg total) by mouth 2 (two) times daily. 180 tablet 3   aspirin-acetaminophen-caffeine (EXCEDRIN MIGRAINE) 250-250-65 MG tablet Take by mouth as needed for headache.     atorvastatin (LIPITOR) 40 MG tablet Take 1 tablet by mouth once daily 90 tablet 3   cholecalciferol (VITAMIN D3) 25 MCG (1000 UNIT) tablet Take 1,000 Units by mouth daily.     Cyanocobalamin (B-12 PO) Take 1 capsule by mouth daily.     Fluticasone-Umeclidin-Vilant (TRELEGY ELLIPTA) 100-62.5-25 MCG/ACT AEPB Inhale 1 puff into the lungs daily.     ibuprofen (ADVIL) 800 MG tablet Take 800 mg by mouth daily as needed for headache.     icosapent Ethyl (VASCEPA) 1 g capsule Take 2 capsules (2 g total) by mouth 2 (two) times daily. 120 capsule 10   metoprolol succinate (TOPROL XL) 25 MG 24 hr tablet Take 0.5 tablets (12.5 mg total) by mouth daily. 90 tablet 3   senna-docusate (SENNA S) 8.6-50 MG tablet Take 1-2 tablets by mouth See admin instructions. Take 2  tablets in the morning and 2 tablet at night     traMADol (ULTRAM) 50 MG tablet Take 1 tablet (50 mg total) by mouth every 6 (six) hours as needed. 15 tablet 0   No current facility-administered medications for this visit.    REVIEW OF SYSTEMS:   Constitutional: ( - ) fevers, ( - )  chills , ( - ) night sweats Eyes: ( - ) blurriness of vision, ( - ) double vision, ( - ) watery eyes Ears, nose, mouth, throat, and face: ( - ) mucositis, ( - ) sore throat Respiratory: ( - ) cough, ( - ) dyspnea, ( - ) wheezes Cardiovascular: ( - ) palpitation, ( - ) chest discomfort, ( - ) lower extremity swelling Gastrointestinal:  ( - ) nausea, ( - ) heartburn, ( - )  change in bowel habits Skin: ( - ) abnormal skin rashes Lymphatics: ( - ) new lymphadenopathy, ( - ) easy bruising Neurological: ( - ) numbness, ( - ) tingling, ( - ) new weaknesses Behavioral/Psych: ( - ) mood change, ( - ) new changes  All other systems were reviewed with the patient and are negative.  PHYSICAL EXAMINATION:  Vitals:   05/28/23 1032  BP: 128/82  Pulse: 64  Resp: 13  Temp: 98.1 F (36.7 C)  SpO2: 99%   Filed Weights   05/28/23 1032  Weight: 210 lb 3.2 oz (95.3 kg)    GENERAL: Well-appearing middle-aged Caucasian male alert, no distress and comfortable SKIN: skin color, texture, turgor are normal, no rashes or significant lesions EYES: conjunctiva are pink and non-injected, sclera clear LUNGS: clear to auscultation and percussion with normal breathing effort HEART: regular rate & rhythm and no murmurs and no lower extremity edema Musculoskeletal: no cyanosis of digits and no clubbing  PSYCH: alert & oriented x 3, fluent speech NEURO: no focal motor/sensory deficits  LABORATORY DATA:  I have reviewed the data as listed    Latest Ref Rng & Units 05/28/2023    9:09 AM 11/18/2022    9:47 AM 07/20/2022    3:54 PM  CBC  WBC 4.0 - 10.5 K/uL 7.8  7.0  11.7   Hemoglobin 13.0 - 17.0 g/dL 40.9  81.1  91.4    Hematocrit 39.0 - 52.0 % 53.0  53.1  48.7   Platelets 150 - 400 K/uL 62  74  96        Latest Ref Rng & Units 05/28/2023    9:09 AM 11/18/2022    9:47 AM 09/16/2022    9:57 AM  CMP  Glucose 70 - 99 mg/dL 85  74  96   BUN 6 - 20 mg/dL 13  12  16    Creatinine 0.61 - 1.24 mg/dL 7.82  9.56  2.13   Sodium 135 - 145 mmol/L 142  141  143   Potassium 3.5 - 5.1 mmol/L 4.1  4.3  4.4   Chloride 98 - 111 mmol/L 107  107  108   CO2 22 - 32 mmol/L 29  30  20    Calcium 8.9 - 10.3 mg/dL 9.5  9.7  9.3   Total Protein 6.5 - 8.1 g/dL 7.0  7.3    Total Bilirubin 0.3 - 1.2 mg/dL 0.9  0.9    Alkaline Phos 38 - 126 U/L 92  88    AST 15 - 41 U/L 25  25    ALT 0 - 44 U/L 25  21      RADIOGRAPHIC STUDIES: No results found.  ASSESSMENT & PLAN Bob Ross is a 57 y.o. male who presents to the hematology clinic for evaluation of acute pulmonary embolism. We reviewed possible etiologies for venous thromboembolisms including prolonged travel/immobility, surgery (particular abdominal or orthropedic), trauma,  and pregnancy/ estrogen containing birth control. After a detailed history and review of the records there is no clear provoking factor for this patient's VTE.  Patients with unprovoked VTEs have up to 25% recurrence after 5 years and 36% at 10 years, with 4% of these clots being fatal ([id number] [phone]). Therefore the formal recommendation for unprovoked VTE's is lifelong anticoagulation, as the cause may not be transient or reversible. We recommend 6 months or full strength anticoagulation with a re-evaluation after that time.  The patient's will then have a choice of maintenance dose DOAC (preferred, recommended),  81mg  ASA PO daily (non-preferred), or no further anticoagulation (not recommended).    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 --patient denies any bleeding, bruising, or dark stools on this medication. It is well tolerated. No difficulties accessing/affording the medication --Labs today show white blood cell count 7.8, hemoglobin 17.6, MCV 94.5, and platelets of 62.  Okay to continue Eliquis therapy as long as platelets are greater than 50 --RTC in 6 months' time with strict return precautions for overt signs of bleeding.   #Thrombocytopenia -- Nutritional and viral workup were both negative. -- Patient has a low immature platelet fraction, does not support the diagnosis of ITP -- Recommend pursuing a ultrasound of the abdomen to assess for liver disease/splenomegaly.  Patient declined to have this performed.  We do have prior imaging of the abdomen from August 2023 which shows no evidence of cirrhosis or splenomegaly. 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  Patient reports a large portion of his liver was removed --Patient has large ventral hernia secondary to his liver operation. -- Continue to monitor  No orders of the defined types were placed in this encounter.   All questions were answered. The patient knows to call the clinic with any problems, questions or concerns.  A total of more than 30 minutes were spent on this encounter with face-to-face time and non-face-to-face time, including preparing to see the patient, ordering tests and/or medications, counseling the patient and coordination of care as outlined above.   Bob Barns, MD Department of Hematology/Oncology [facility] Phone: [phone] Pager: [phone] Email: [email or web address]  05/28/2023 1:58 PM

## 2023-07-28 ENCOUNTER — Ambulatory Visit (HOSPITAL_BASED_OUTPATIENT_CLINIC_OR_DEPARTMENT_OTHER): Payer: BC Managed Care – PPO | Admitting: Student

## 2023-07-28 ENCOUNTER — Encounter (HOSPITAL_BASED_OUTPATIENT_CLINIC_OR_DEPARTMENT_OTHER): Payer: Self-pay | Admitting: Student

## 2023-07-28 ENCOUNTER — Other Ambulatory Visit (HOSPITAL_BASED_OUTPATIENT_CLINIC_OR_DEPARTMENT_OTHER): Payer: Self-pay

## 2023-07-28 DIAGNOSIS — G8929 Other chronic pain: Secondary | ICD-10-CM | POA: Diagnosis not present

## 2023-07-28 DIAGNOSIS — M5442 Lumbago with sciatica, left side: Secondary | ICD-10-CM | POA: Diagnosis not present

## 2023-07-28 DIAGNOSIS — M5441 Lumbago with sciatica, right side: Secondary | ICD-10-CM | POA: Diagnosis not present

## 2023-07-28 MED ORDER — METHYLPREDNISOLONE 4 MG PO TBPK
ORAL_TABLET | ORAL | 0 refills | Status: DC
  Filled 2023-07-28: qty 21, 6d supply, fill #0

## 2023-07-28 MED ORDER — METHOCARBAMOL 500 MG PO TABS
500.0000 mg | ORAL_TABLET | Freq: Four times a day (QID) | ORAL | 0 refills | Status: AC
  Filled 2023-07-28: qty 40, 10d supply, fill #0

## 2023-07-28 NOTE — Progress Notes (Signed)
Chief Complaint: Low back pain     History of Present Illness:    Bob Ross is a 57 y.o. male presenting today for evaluation of low back pain.  This began 3 days ago without any known injury but he states that he woke up with significant pain in his back.  Pain is mainly left-sided and is traveling down the left leg to the ankle.  He is a patient of Dr. Hilda Lias and did have a MRI of the lumbar spine on 02/24/2023 which did reveal facet osteoarthritis at the L3-L4, L4-L5, and L5-S1 levels as well as L5-S1 disc degeneration with a small disc bulge.  He was being evaluated at that time for low back pain and right foot drop and was recommended for [facility] however has not had this done.  Rates pain today at an 8/10 and has taken tramadol 50 mg last night and today without any relief.  Denies any saddle anesthesia or bowel/bladder dysfunction.   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 Excell Seltzer & Dr. Laneta Simmers   Severe aortic stenosis    Past Surgical History:  Procedure Laterality Date   COLON SURGERY     COLOSTOMY     COLOSTOMY REVERSAL     HERNIA REPAIR     INTRAOPERATIVE TRANSTHORACIC ECHOCARDIOGRAM N/A 04/17/2022   Procedure: INTRAOPERATIVE TRANSTHORACIC ECHOCARDIOGRAM;  Surgeon: Tonny Bollman, MD;  Location: [facility] OR;  Service: Open Heart Surgery;  Laterality: N/A;   LEFT HEART CATH AND CORONARY ANGIOGRAPHY N/A  04/09/2022   Procedure: LEFT HEART CATH AND CORONARY ANGIOGRAPHY;  Surgeon: Orbie Pyo, MD;  Location: [facility] INVASIVE CV LAB;  Service: Cardiovascular;  Laterality: N/A;   LIVER RESECTION     TRANSCATHETER AORTIC VALVE REPLACEMENT, TRANSFEMORAL N/A 04/17/2022   Procedure: Transcatheter Aortic Valve Replacement, Transfemoral;  Surgeon: Tonny Bollman, MD;  Location: [facility] OR;  Service: Open Heart Surgery;  Laterality: N/A;   Social History   Socioeconomic History   Marital status: Married    Spouse name: Not on file   Number of children: Not on file   Years of education: Not on file   Highest education level: Not on file  Occupational History   Not on file  Tobacco Use   Smoking status: Every Day    Current packs/day: 1.50    Average packs/day: 1.5 packs/day for 45.0 years (67.5 ttl pk-yrs)    Types: Cigarettes   Smokeless tobacco: Never  Substance and Sexual Activity   Alcohol use: Yes    Comment: Occasional   Drug use: No   Sexual activity: Not on file  Other Topics Concern   Not on file  Social History Narrative   Not on file   Social Determinants  of Health   Financial Resource Strain: Not on file  Food Insecurity: Not on file  Transportation Needs: Not on file  Physical Activity: Not on file  Stress: Not on file  Social Connections: Not on file   Family History  Problem Relation Age of Onset   AAA (abdominal aortic aneurysm) Mother    Cancer Father    Allergies  Allergen Reactions   Tiotropium Bromide Monohydrate Anaphylaxis    spiriva   Current Outpatient Medications  Medication Sig Dispense Refill   albuterol (VENTOLIN HFA) 108 (90 Base) MCG/ACT inhaler Inhale 2 puffs into the lungs every 4 (four) hours as needed for wheezing or shortness of breath. 1 each 3   amLODipine (NORVASC) 5 MG tablet Take 1 tablet (5 mg total) by mouth daily. 90 tablet 3   apixaban (ELIQUIS) 2.5 MG TABS tablet Take 1 tablet (2.5 mg total) by mouth 2 (two) times daily. 180 tablet 3    aspirin-acetaminophen-caffeine (EXCEDRIN MIGRAINE) 250-250-65 MG tablet Take by mouth as needed for headache.     atorvastatin (LIPITOR) 40 MG tablet Take 1 tablet by mouth once daily 90 tablet 3   cholecalciferol (VITAMIN D3) 25 MCG (1000 UNIT) tablet Take 1,000 Units by mouth daily.     Cyanocobalamin (B-12 PO) Take 1 capsule by mouth daily.     Fluticasone-Umeclidin-Vilant (TRELEGY ELLIPTA) 100-62.5-25 MCG/ACT AEPB Inhale 1 puff into the lungs daily.     ibuprofen (ADVIL) 800 MG tablet Take 800 mg by mouth daily as needed for headache.     icosapent Ethyl (VASCEPA) 1 g capsule Take 2 capsules (2 g total) by mouth 2 (two) times daily. 120 capsule 10   metoprolol succinate (TOPROL XL) 25 MG 24 hr tablet Take 0.5 tablets (12.5 mg total) by mouth daily. 90 tablet 3   senna-docusate (SENNA S) 8.6-50 MG tablet Take 1-2 tablets by mouth See admin instructions. Take 2 tablets in the morning and 2 tablet at night     traMADol (ULTRAM) 50 MG tablet Take 1 tablet (50 mg total) by mouth every 6 (six) hours as needed. 15 tablet 0   No current facility-administered medications for this visit.   No results found.  Review of Systems:   A ROS was performed including pertinent positives and negatives as documented in the HPI.  Physical Exam :   Constitutional: NAD and appears stated age Neurological: Alert and oriented Psych: Appropriate affect and cooperative There were no vitals taken for this visit.   Comprehensive Musculoskeletal Exam:    Tenderness with palpation over the left lumbar region and paraspinal muscles.  Decreased lumbar range of motion in all planes due to pain.  Passive range of motion of the left hip to 120 degrees flexion, 30 external rotation, and 10 internal rotation.  Knee strength 5/5 bilaterally.  Left foot drop noted.  Bilateral plantarflexion strength 5/5.  Imaging:   Xray review from 02/18/2023 (lumbar spine 4 views): Adequate intervertebral disc spacing with normal  alignment.  No acute abnormality.   I personally reviewed and interpreted the radiographs.   Assessment:   57 y.o. male with 3-day history of acute on chronic left-sided low back pain.  He is experiencing sciatica symptoms into the left leg with an associated foot drop.  Lumbar MRI 5 months ago showed multilevel facet osteoarthritis for which she was recommended for epidural steroid injection.  I do think this would benefit him significantly so I will plan to refer him to Dr. Alvester Morin for further assessment.  Will start him on Robaxin and a Medrol Dosepak today for symptom relief.  Plan :    -Referral to Dr. Burna Mortimer for further evaluation/possible ESI -Return to clinic as needed     I personally saw and evaluated the patient, and participated in the management and treatment plan.  Hazle Nordmann, PA-C Orthopedics

## 2023-08-05 ENCOUNTER — Ambulatory Visit: Payer: BC Managed Care – PPO | Admitting: Physical Medicine and Rehabilitation

## 2023-08-05 ENCOUNTER — Encounter: Payer: Self-pay | Admitting: Physical Medicine and Rehabilitation

## 2023-08-05 DIAGNOSIS — M5441 Lumbago with sciatica, right side: Secondary | ICD-10-CM | POA: Diagnosis not present

## 2023-08-05 DIAGNOSIS — M5416 Radiculopathy, lumbar region: Secondary | ICD-10-CM

## 2023-08-05 DIAGNOSIS — G8929 Other chronic pain: Secondary | ICD-10-CM

## 2023-08-05 DIAGNOSIS — M5442 Lumbago with sciatica, left side: Secondary | ICD-10-CM | POA: Diagnosis not present

## 2023-08-05 DIAGNOSIS — R202 Paresthesia of skin: Secondary | ICD-10-CM | POA: Diagnosis not present

## 2023-08-05 NOTE — Progress Notes (Signed)
Bob Ross - 57 y.o. male MRN [id number]  Date of birth: Jan 09, 1966  Office Visit Note: Visit Date: 08/05/2023 PCP: Wilmon Pali, FNP Referred by: Wilmon Pali, FNP  Subjective: Chief Complaint  Patient presents with   Lower Back - Pain   HPI: Bob Ross is a 57 y.o. male who comes in today per the request of Hazle Nordmann, [place] for evaluation of chronic pain and weakness to bilateral legs. Symptoms ongoing for 12 years. He is managed by Dr. Darreld Mclean from orthopedic standpoint. He describes his symptoms as episodes of pain and weakness to legs, lasting 3-4 days at a time, typically one leg at a time. These episodes also involve intermittent foot drop, symptoms are typically self limiting and do resolve without interventional treatments. He describes his symptoms as muscle cramping and muscle locking, currently denies symptoms today. Some relief of pain with home exercise regimen, rest and use of medications. No history of formal physical therapy. Recent lumbar MRI imaging exhibits facet osteoarthritis at L3-L4, L4-L5 and L5-S1 that could be source of pain/referred facet joint syndrome. There is annular bulging and tiny annular fissure at L5-S1. No foraminal stenosis, no high grade spinal canal stenosis noted. Patient denies focal weakness, numbness and tingling today. No recent trauma or falls.       Review of Systems  Musculoskeletal:  Positive for myalgias. Negative for back pain.  Neurological:  Negative for tingling, focal weakness and weakness.  All other systems reviewed and are negative.  Otherwise per HPI.  Assessment & Plan: Visit Diagnoses:    ICD-10-CM   1. Chronic bilateral low back pain with bilateral sciatica  M54.42    M54.41    G89.29     2. Lumbar radiculopathy  M54.16     3. Paresthesia of skin  R20.2        Plan: Findings:  Chronic pain and weakness to bilateral legs. Patient continues to have intermittent symptoms that are self limiting. No symptoms  noted today. Patients clinical presentation and exam are complex, his symptoms do not directly correlate with lumbar MRI imaging from July of this year. There are no findings on MRI imaging that would lead to foot drop. There is no severe nerve impingement, no high grade spinal canal stenosis. Lumbar exam today was non focal, good strength noted to bilateral lower extremities, no foot drop noted. Given his intermittent symptoms and lumbar MRI imaging would not recommend interventional spine procedures at this time. He does have palpable trigger point to left lumbar paraspinal region upon palpation today. Would recommend patient attend short course of formal physical therapy with a focus on manual treatments and dry needling. I did place order for [facility] at [facility] today as this is close to his house. Should his symptoms return would recommend referral to neurologist for evaluation. Could also look at obtaining cervical MRI imaging, however symptoms do not fit with classic myelopathy, cervical strength exam today was normal, good strength to bilateral upper extremities. Patient has no questions at this time. He will follow up with [place] as needed. No red flag symptoms noted upon exam today.     Meds & Orders: No orders of the defined types were placed in this encounter.  No orders of the defined types were placed in this encounter.   Follow-up: Return if symptoms worsen or fail to improve.   Procedures: No procedures performed      Clinical History: Study Result  Narrative & Impression CLINICAL DATA:  Low back pain, symptoms persist with greater than 6 weeks of treatment.   EXAM: MRI LUMBAR SPINE WITHOUT AND WITH CONTRAST   TECHNIQUE: Multiplanar and multiecho pulse sequences of the lumbar spine were obtained without and with intravenous contrast.   CONTRAST:  9 cc Vueway   COMPARISON:  None Available.   FINDINGS: Segmentation:  5 lumbar type vertebral bodies.   Alignment:  Normal    Vertebrae:  Normal.  No fracture or focal bone lesion.   Conus medullaris and cauda equina: Conus extends to the L1 level. Conus and cauda equina appear normal.   Paraspinal and other soft tissues: Aortic atherosclerosis. Maximal diameter just above the bifurcation is 3.2 cm.   Disc levels:   No disc level abnormality at L2-3 or above.   L3-4: No disc abnormality. Mild facet degeneration and hypertrophy. No stenosis or neural compression. The facet arthritis could be painful.   L4-5: No disc abnormality. Mild facet degeneration and hypertrophy. No stenosis or neural compression. The facet arthritis could be painful.   L5-S1: Chronic disc degeneration with desiccation. Mild annular bulging with a tiny annular fissure. No stenosis of the canal or foramina. Mild facet degeneration and hypertrophy could be symptomatic.   IMPRESSION: 1. 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 2. Disc degeneration at L5-S1 with minimal annular bulging and a tiny annular fissure. No stenosis of the canal or foramina. 3. Aortic atherosclerosis with maximal diameter of 3.2 cm just above the bifurcation. Recommend follow-up ultrasound every 3 years. This recommendation follows ACR consensus guidelines: White Paper of the ACR Incidental Findings Committee II on Vascular Findings. J Am Coll Radiol 2013; 10:789-794.     Electronically Signed   By: Paulina Fusi M.D.   On: 03/04/2023 12:36   He reports that he has been smoking cigarettes. He has a 67.5 pack-year smoking history. He has never used smokeless tobacco. No results for input(s): "HGBA1C", "LABURIC" in the last 8760 hours.  Objective:  VS:  HT:    WT:   BMI:     BP:   HR: bpm  TEMP: ( )  RESP:  Physical Exam Vitals and nursing note reviewed.  HENT:     Head: Normocephalic and atraumatic.     Right Ear: External ear normal.     Left Ear: External ear  normal.     Nose: Nose normal.     Mouth/Throat:     Mouth: Mucous membranes are moist.  Eyes:     Extraocular Movements: Extraocular movements intact.  Cardiovascular:     Rate and Rhythm: Normal rate.     Pulses: Normal pulses.  Pulmonary:     Effort: Pulmonary effort is normal.  Abdominal:     General: Abdomen is flat. There is no distension.  Musculoskeletal:        General: Normal range of motion.     Cervical back: Normal range of motion.     Comments: Patient rises from seated position to standing without difficulty. Good lumbar range of motion. No pain noted with facet loading. 5/5 strength noted with bilateral hip flexion, knee flexion/extension, ankle dorsiflexion/plantarflexion and EHL. No clonus noted bilaterally. No pain upon palpation of greater trochanters. No pain with internal/external rotation of bilateral hips. Sensation intact bilaterally. Palpable myofascial trigger point noted to left lumbar paraspinal region. Negative slump test bilaterally. Ambulates without aid, gait steady.   No discomfort noted with flexion, extension  and side-to-side rotation. Patient has good strength in the upper extremities including 5 out of 5 strength in wrist extension, long finger flexion and APB. Shoulder range of motion is full bilaterally without any sign of impingement. There is no atrophy of the hands intrinsically. Sensation intact bilaterally. Negative Hoffman's sign. Negative Spurling's sign.       Skin:    General: Skin is warm and dry.     Capillary Refill: Capillary refill takes less than 2 seconds.  Neurological:     General: No focal deficit present.     Mental Status: He is alert and oriented to person, place, and time.     Motor: No weakness.     Gait: Gait normal.  Psychiatric:        Mood and Affect: Mood normal.        Behavior: Behavior normal.     Ortho Exam  Imaging: No results found.  Past Medical/Family/Surgical/Social History: Medications & Allergies  reviewed per EMR, new medications updated. 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   Severe aortic stenosis 04/15/2022   Caries    Accretions on teeth    Chronic periodontitis    Non-ST elevation (NSTEMI) myocardial infarction (HCC) 04/09/2022   Otitis externa 04/08/2022   Tobacco abuse 04/08/2022   Coronary artery disease 01/19/2020   Rupture of left distal biceps tendon 08/13/2018   Rectus diastasis 04/25/2015   Chest pain 03/29/2014   Thrombocytopenia (HCC) 03/29/2014   Past Medical History:  Diagnosis Date   CAD (coronary artery disease)    Nonobstructive by cardiac catheterization 2021 [facility] cardiology   Cardiomyopathy [facility])    Colon cancer (HCC)    Metastatic to liver and lymph nodes status post surgery and chemotherapy 2005   Erectile dysfunction    Essential hypertension    History of pneumonia    Incisional hernia    Mixed hyperlipidemia    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 Excell Seltzer & Dr. Laneta Simmers   Severe aortic stenosis    Family History  Problem Relation Age of Onset   AAA (abdominal aortic aneurysm) Mother    Cancer Father    Past Surgical History:  Procedure Laterality Date   COLON SURGERY     COLOSTOMY     COLOSTOMY REVERSAL     HERNIA REPAIR     INTRAOPERATIVE TRANSTHORACIC ECHOCARDIOGRAM N/A 04/17/2022   Procedure: INTRAOPERATIVE TRANSTHORACIC ECHOCARDIOGRAM;  Surgeon: Tonny Bollman, MD;  Location: [facility] OR;  Service: Open Heart Surgery;  Laterality: N/A;   LEFT HEART CATH AND  CORONARY ANGIOGRAPHY N/A 04/09/2022   Procedure: LEFT HEART CATH AND CORONARY ANGIOGRAPHY;  Surgeon: Orbie Pyo, MD;  Location: [facility] INVASIVE CV LAB;  Service: Cardiovascular;  Laterality: N/A;   LIVER RESECTION     TRANSCATHETER AORTIC VALVE REPLACEMENT, TRANSFEMORAL N/A 04/17/2022   Procedure: Transcatheter Aortic Valve Replacement, Transfemoral;  Surgeon: Tonny Bollman, MD;  Location: [facility] OR;  Service: Open Heart Surgery;  Laterality: N/A;   Social History   Occupational History   Not  on file  Tobacco Use   Smoking status: Every Day    Current packs/day: 1.50    Average packs/day: 1.5 packs/day for 45.0 years (67.5 ttl pk-yrs)    Types: Cigarettes   Smokeless tobacco: Never  Substance and Sexual Activity   Alcohol use: Yes    Comment: Occasional   Drug use: No   Sexual activity: Not on file

## 2023-08-11 ENCOUNTER — Other Ambulatory Visit: Payer: Self-pay | Admitting: Physician Assistant

## 2023-08-11 DIAGNOSIS — Z85038 Personal history of other malignant neoplasm of large intestine: Secondary | ICD-10-CM | POA: Diagnosis not present

## 2023-08-11 DIAGNOSIS — J449 Chronic obstructive pulmonary disease, unspecified: Secondary | ICD-10-CM | POA: Diagnosis not present

## 2023-08-11 DIAGNOSIS — I2510 Atherosclerotic heart disease of native coronary artery without angina pectoris: Secondary | ICD-10-CM | POA: Diagnosis not present

## 2023-10-14 ENCOUNTER — Ambulatory Visit: Payer: BC Managed Care – PPO

## 2023-10-14 ENCOUNTER — Ambulatory Visit (HOSPITAL_COMMUNITY): Payer: BC Managed Care – PPO

## 2023-10-16 IMAGING — DX DG HAND COMPLETE 3+V*L*
3 series · 3 of 3 positions shown · non-contrast
Comparison: None

CLINICAL DATA: LEFT hand pain, redness, and swelling for 2 days,
possible insect bite second MCP joint region

EXAM:
LEFT HAND - COMPLETE 3+ VIEW

[hand pa]
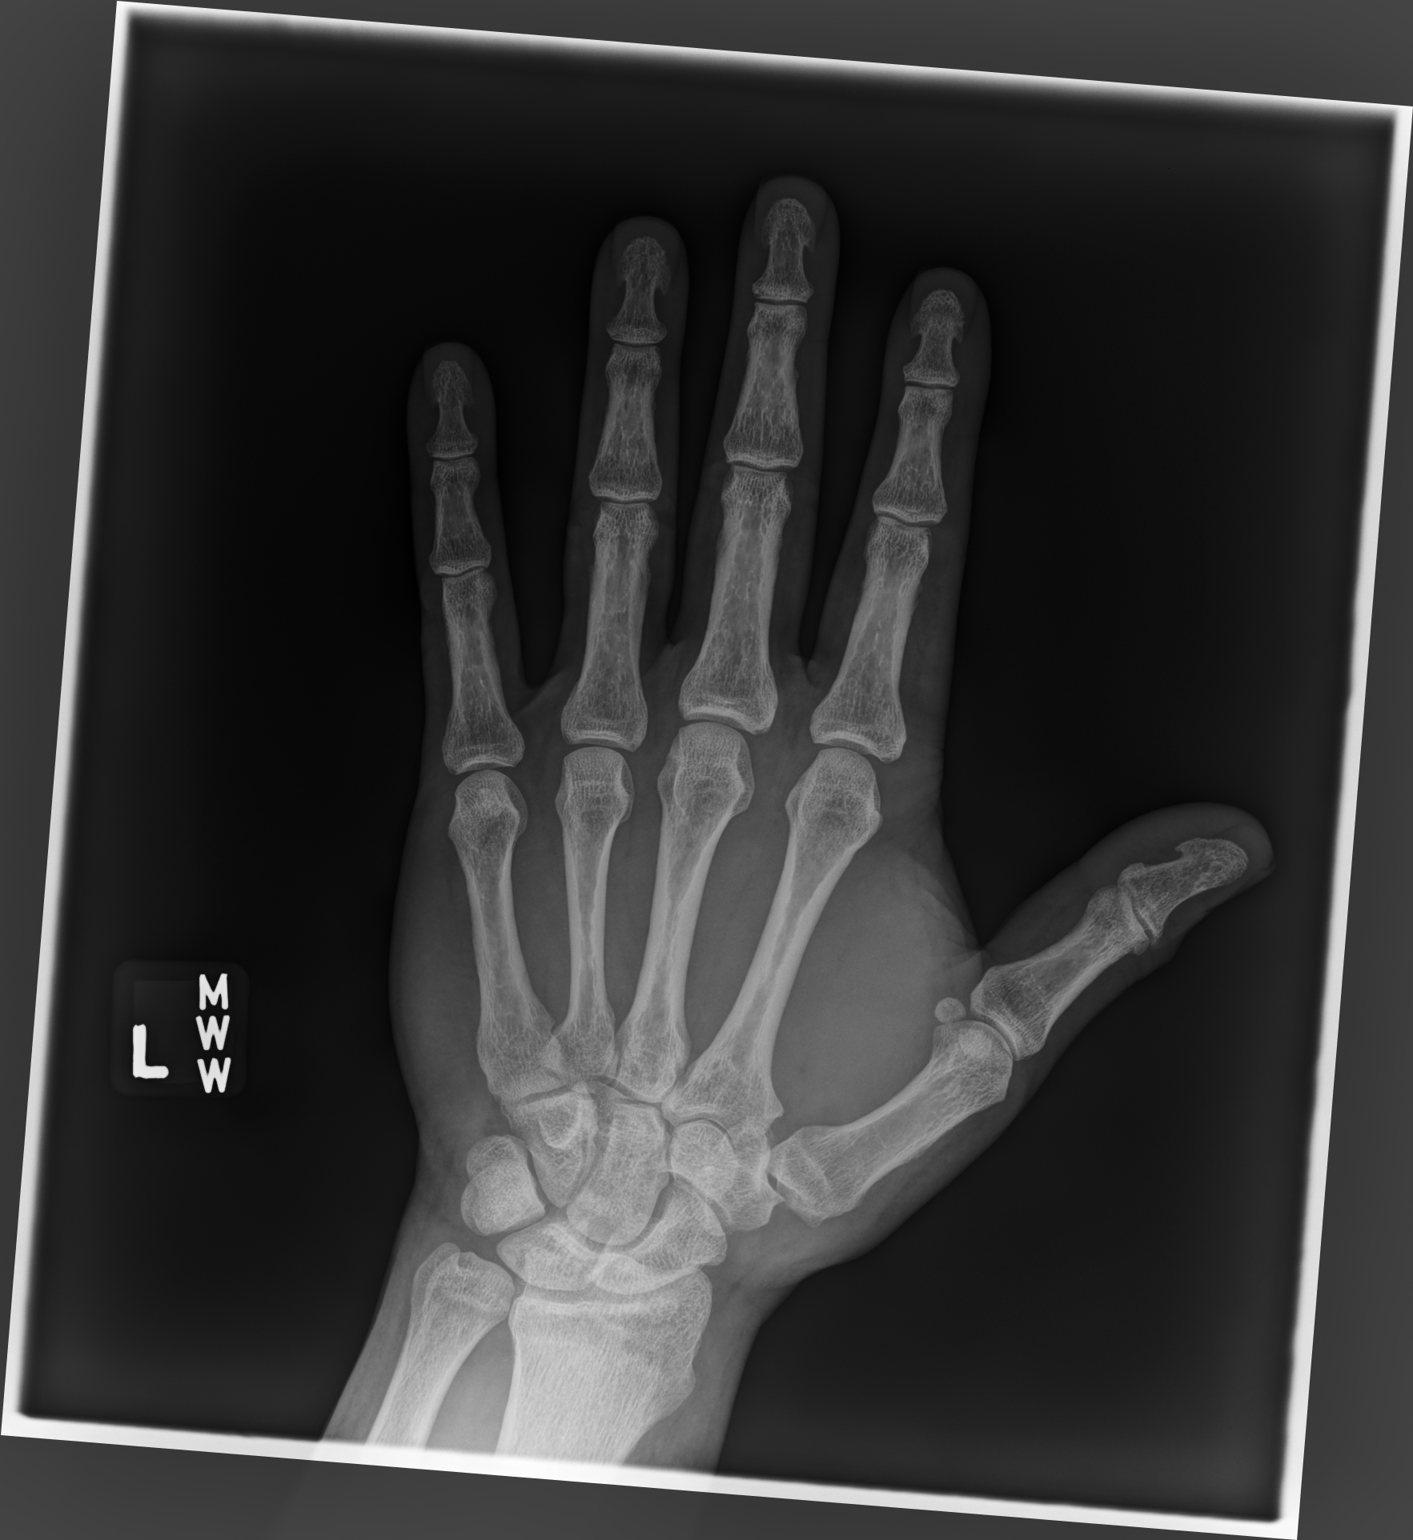

[hand mlo]
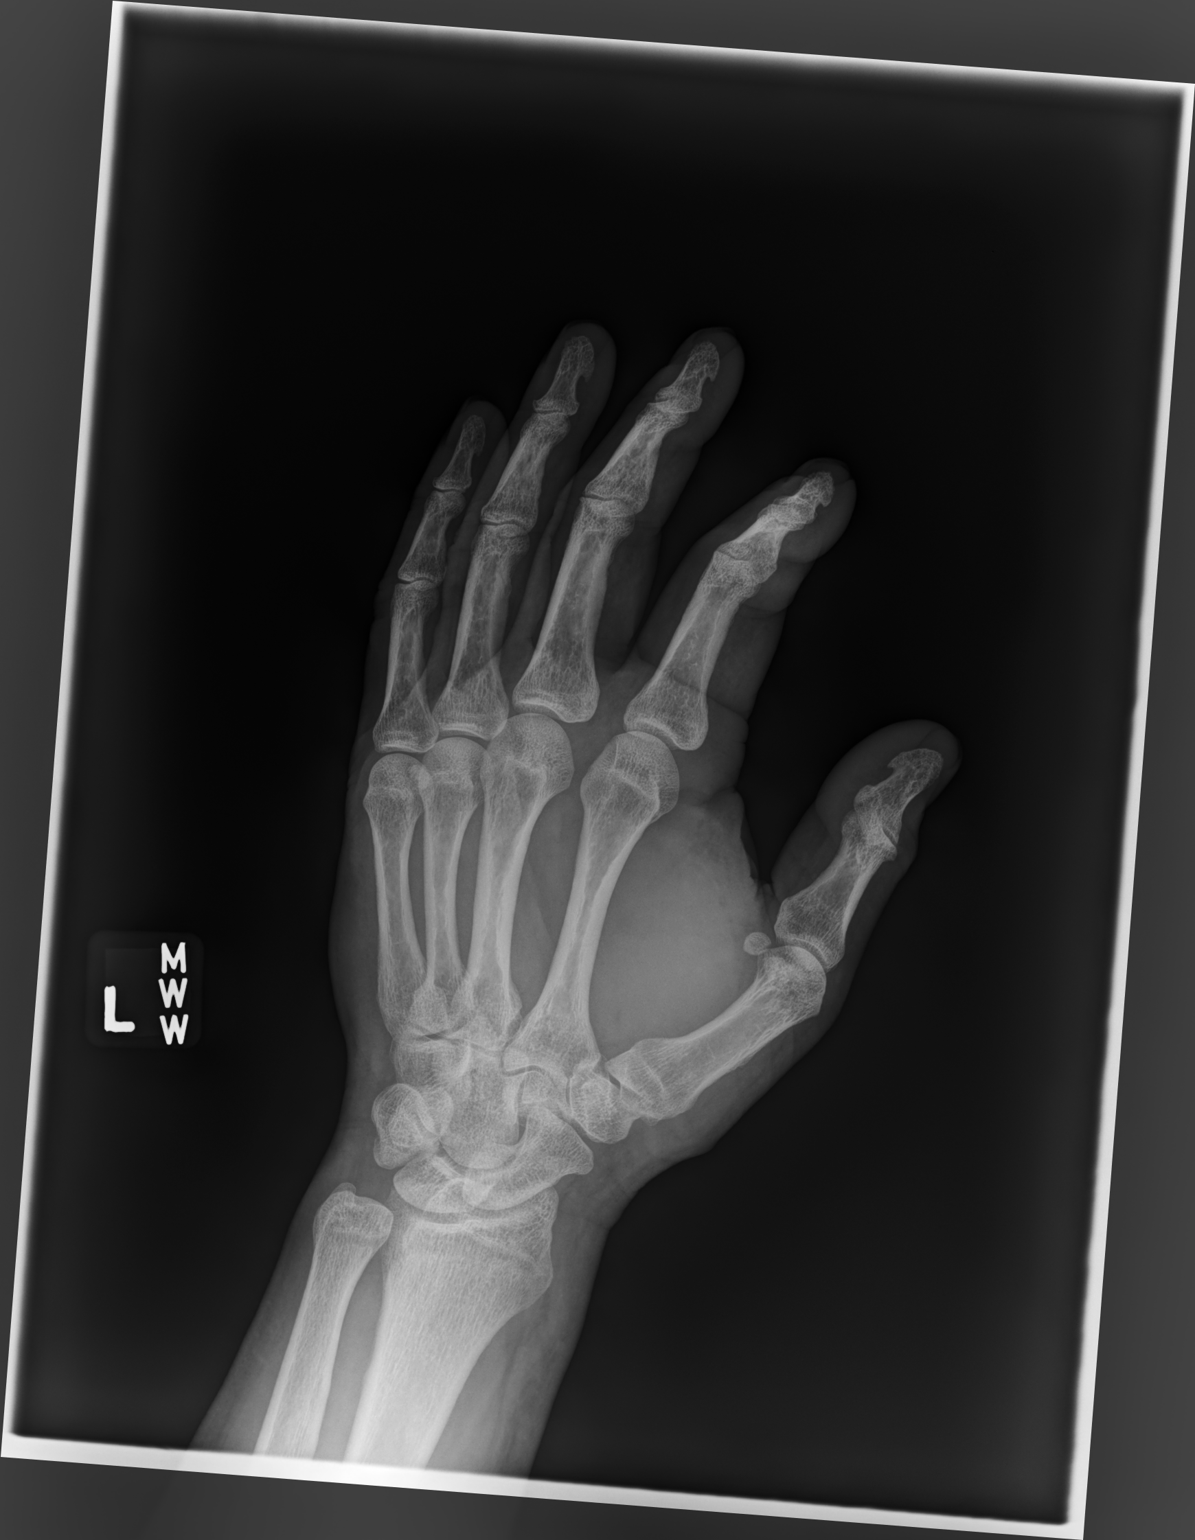

[hand lat]
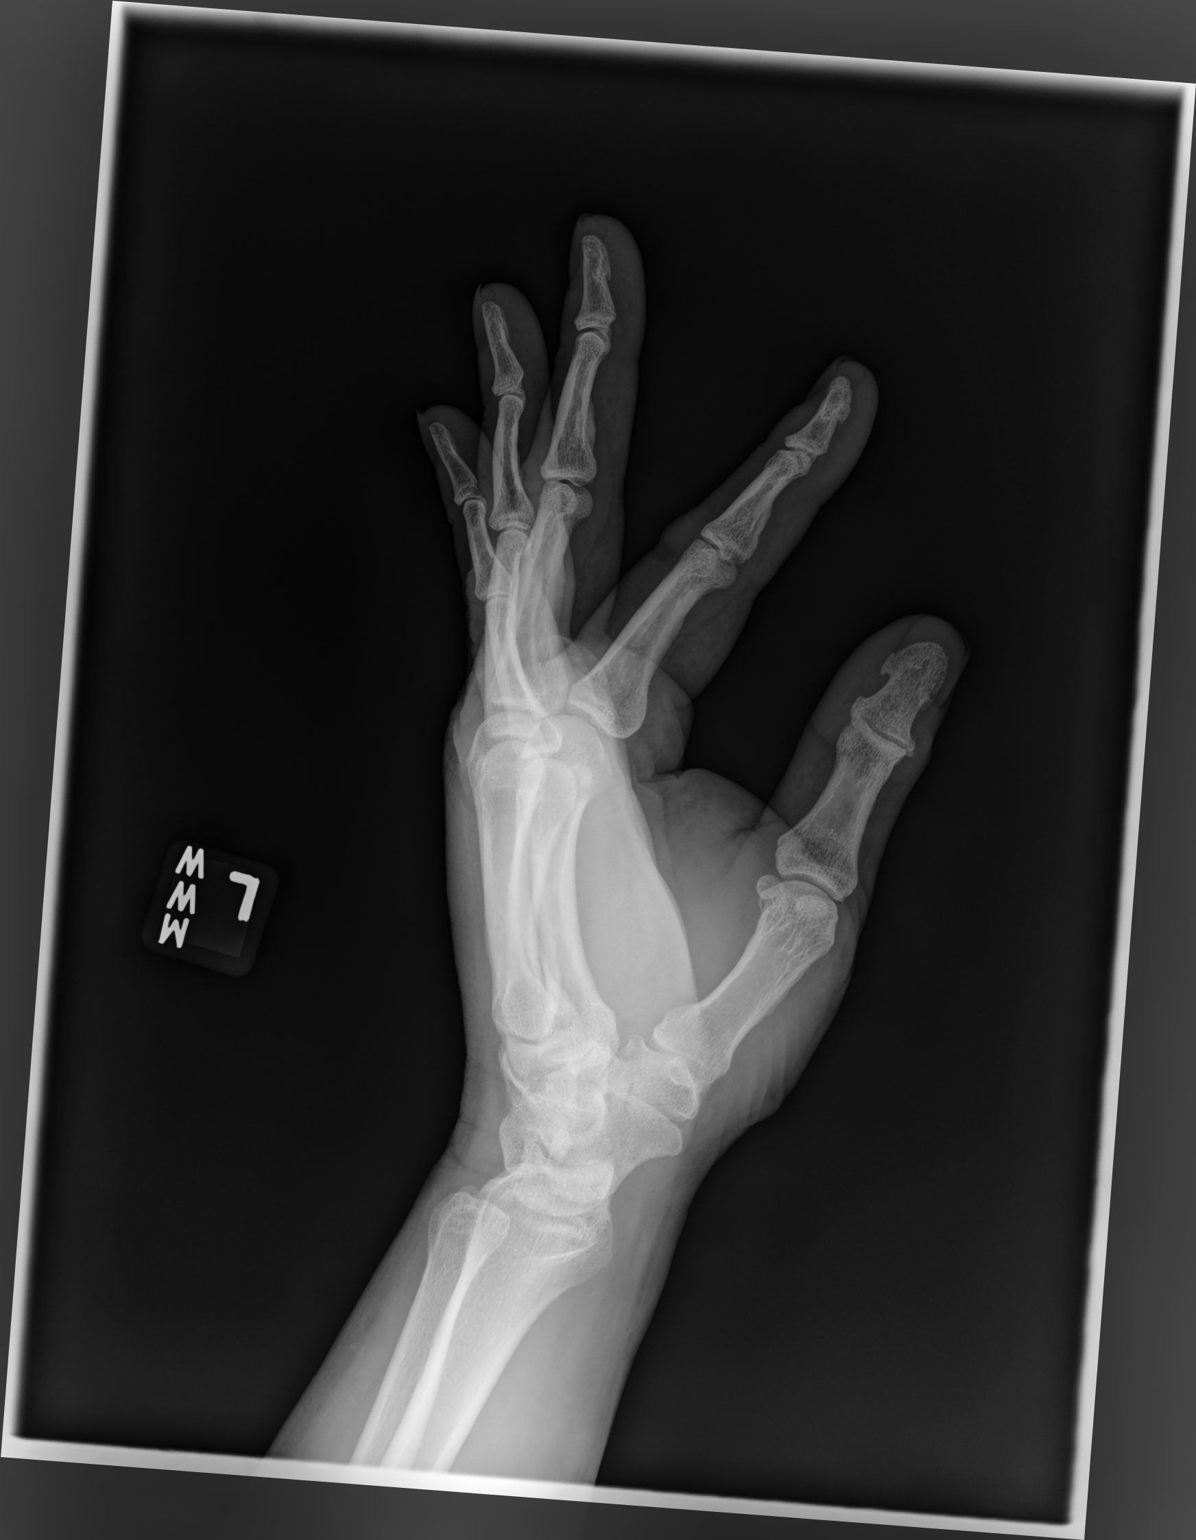

[3 of 3 positions shown; findings below may reference images not displayed]

FINDINGS: Osseous mineralization normal.

Joint spaces preserved though mild degenerative changes are noted at
the IP joint thumb.

No acute fracture, dislocation, or bone destruction.
IMPRESSION: No acute osseous abnormalities.

## 2023-10-23 ENCOUNTER — Telehealth: Payer: Self-pay | Admitting: Hematology and Oncology

## 2023-10-30 ENCOUNTER — Other Ambulatory Visit (HOSPITAL_COMMUNITY): Payer: BC Managed Care – PPO

## 2023-11-14 NOTE — Progress Notes (Unsigned)
 [facility]   MULTIDISCIPLINARY HEART VALVE CLINIC                                     Cardiology Office Note:    Date:  11/16/2023   ID:  Bob Ross, DOB Dec 25, 1965, MRN [id number]  PCP:  Wilmon Pali, FNP  [facility] Cardiologist:  Tonny Bollman, MD  [facility] Electrophysiologist:  None   Referring MD: Wilmon Pali, FNP   Follow up.  History of Present Illness:    Bob Ross is a 58 y.o. 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   He was admitted to [facility] in 03/2022 for acute chest pain and found to have a NSTEMI (pk troponin 1,146). LHC on 8/16 showed mild non-obstructive CAD but severe aortic stenosis and severely elevated LVEDP. Echo showed LVEF of 50-55% with hypokinesis of the anterolateral wall and posterior wall as well as severe AS. He was evaluated by the structural heart team and underwent successful TAVR with a 26 mm Edwards S3UR via the TF approach on 04/17/22 by Dr. Excell Seltzer and Dr. Laneta Simmers. Post op echo showed EF 60-65%, normally functioning TAVR with a mean gradient of 12.2 mmHg and no PVL. He was discharged on a baby aspirin.   The patient's TAVR was initially uncomplicated, but he developed an increase in his mean transvalvular gradient on follow-up echo from 12 mmHg to 23 mmHg. A CTA of the heart confirmed subacute leaflet thrombosis and he was started on apixaban. He did well with this with improvement in his gradients back to baseline. The patient subsequently was found to have a pulmonary embolism and it was recommended that he remain on  long-term anticoagulation. He is followed by hematology. Follow up CTA 10/02/22 showed marked improvement in HALT/HAM and leaflet motion. Echo 11/2022 showed EF 50-55% and stable TAVR with mean gradient of 12 mm hg. He was later switched from Eliquis 5mg  BID to 2.5mg  BID given issues with bruising and bleeding while at work. Echo 04/15/23 showed EF 60% with an increase in mean gradient back to 21 mm hg. Follow up recommended in 6 months given increase in gradients and lower dose of Eliquis.  Labs done at cancer center 05/2023: HG 17.6, Platelets 62, creat 1.18, K 4.1 (personally reviewed).   Today the patient presents to clinic for follow up. Here with his wife. No CP or SOB. No LE edema, orthopnea or PND. No dizziness or syncope. No blood in stool or urine. No palpitations. Bruising and bleeding has gotten better with the lower dose of Eliquis. Still smoking.   Past Medical History:  Diagnosis Date   CAD (coronary artery disease)    Nonobstructive by cardiac catheterization 2021 [facility] cardiology   Cardiomyopathy [facility])    Colon cancer [facility])    Metastatic to liver and lymph nodes status post surgery and chemotherapy 2005   Erectile dysfunction    Essential hypertension    History of pneumonia    Incisional hernia    Mixed hyperlipidemia    S/P  TAVR (transcatheter aortic valve replacement) 04/17/2022   s/p TAVR wtih a 26 mm Patsy Lager via the TF approach by Dr. Excell Seltzer & Dr. Laneta Simmers   Severe aortic stenosis     Past Surgical History:  Procedure Laterality Date   COLON SURGERY     COLOSTOMY     COLOSTOMY REVERSAL     HERNIA REPAIR     INTRAOPERATIVE TRANSTHORACIC ECHOCARDIOGRAM N/A 04/17/2022   Procedure: INTRAOPERATIVE TRANSTHORACIC ECHOCARDIOGRAM;  Surgeon: Tonny Bollman, MD;  Location[facility] OR;  Service: Open Heart Surgery;  Laterality: N/A;   LEFT HEART CATH AND CORONARY ANGIOGRAPHY N/A 04/09/2022   Procedure: LEFT HEART CATH AND CORONARY ANGIOGRAPHY;  Surgeon: Orbie Pyo, MD;   Location: [facility] INVASIVE CV LAB;  Service: Cardiovascular;  Laterality: N/A;   LIVER RESECTION     TRANSCATHETER AORTIC VALVE REPLACEMENT, TRANSFEMORAL N/A 04/17/2022   Procedure: Transcatheter Aortic Valve Replacement, Transfemoral;  Surgeon: Tonny Bollman, MD;  Location: [facility] OR;  Service: Open Heart Surgery;  Laterality: N/A;    Current Medications: Current Meds  Medication Sig   albuterol (VENTOLIN HFA) 108 (90 Base) MCG/ACT inhaler Inhale 2 puffs into the lungs every 4 (four) hours as needed for wheezing or shortness of breath.   amLODipine (NORVASC) 5 MG tablet Take 1 tablet (5 mg total) by mouth daily.   apixaban (ELIQUIS) 5 MG TABS tablet Take 1 tablet (5 mg total) by mouth 2 (two) times daily.   atorvastatin (LIPITOR) 40 MG tablet Take 1 tablet by mouth once daily   cholecalciferol (VITAMIN D3) 25 MCG (1000 UNIT) tablet Take 1,000 Units by mouth daily.   Cyanocobalamin (B-12 PO) Take 1 capsule by mouth daily.   Fluticasone-Umeclidin-Vilant (TRELEGY ELLIPTA) 100-62.5-25 MCG/ACT AEPB Inhale 1 puff into the lungs daily.   icosapent Ethyl (VASCEPA) 1 g capsule Take 2 capsules (2 g total) by mouth 2 (two) times daily.   metoprolol succinate (TOPROL-XL) 25 MG 24 hr tablet Take 1/2 (one-half) tablet by mouth once daily   senna-docusate (SENNA S) 8.6-50 MG tablet Take 1-2 tablets by mouth See admin instructions. Take 2 tablets in the morning and 2 tablet at night   traMADol (ULTRAM) 50 MG tablet Take 1 tablet (50 mg total) by mouth every 6 (six) hours as needed.   [DISCONTINUED] apixaban (ELIQUIS) 2.5 MG TABS tablet Take 1 tablet (2.5 mg total) by mouth 2 (two) times daily.     Allergies:   Tiotropium bromide monohydrate   Social History   Socioeconomic History   Marital status: Married    Spouse name: Not on file   Number of children: Not on file   Years of education: Not on file   Highest education level: Not on file  Occupational History   Not on file  Tobacco Use   Smoking  status: Every Day    Current packs/day: 1.50    Average packs/day: 1.5 packs/day for 45.0 years (67.5 ttl pk-yrs)    Types: Cigarettes   Smokeless tobacco: Never  Substance and Sexual Activity   Alcohol use: Yes    Comment: Occasional   Drug use: No   Sexual activity: Not on file  Other Topics Concern   Not on file  Social History Narrative   Not on file   Social Drivers of Health   Financial Resource Strain: Not on file  Food Insecurity: Not on file  Transportation Needs: Not on file  Physical Activity: Not on file  Stress: Not on file  Social Connections: Not on file  Family History: The patient's family history includes AAA (abdominal aortic aneurysm) in his mother; Cancer in his father.  ROS:   Please see the history of present illness.    All other systems reviewed and are negative.  EKGs/Labs/Other Studies Reviewed:     EKG:  EKG is NOT ordered today.    Recent Labs: 05/28/2023: ALT 25; BUN 13; Creatinine 1.18; Hemoglobin 17.6; Platelet Count 62; Potassium 4.1; Sodium 142  Recent Lipid Panel    Component Value Date/Time   CHOL 126 04/10/2022 0142   TRIG 309 (H) 04/10/2022 0142   HDL 36 (L) 04/10/2022 0142   CHOLHDL 3.5 04/10/2022 0142   VLDL 62 (H) 04/10/2022 0142   LDLCALC 28 04/10/2022 0142     Risk Assessment/Calculations:           Physical Exam:    VS:  BP 114/74   Pulse 63   Ht 5\' 11"  (1.803 m)   Wt 210 lb 3.2 oz (95.3 kg)   SpO2 98%   BMI 29.32 kg/m     Wt Readings from Last 3 Encounters:  11/16/23 210 lb 3.2 oz (95.3 kg)  05/28/23 210 lb 3.2 oz (95.3 kg)  04/15/23 211 lb 9.6 oz (96 kg)     GEN: Well nourished, well developed in no acute distress NECK: No JVD CARDIAC: RRR, 2/6 SEM @ RUSB. No rubs, gallops RESPIRATORY:  Clear to auscultation without rales, wheezing or rhonchi  ABDOMEN: Soft, non-tender, non-distended EXTREMITIES:  No edema; No deformity.    ASSESSMENT:    1. S/P TAVR (transcatheter aortic valve  replacement)   2. Heart failure with improved ejection fraction (HFimpEF) (HCC)   3. Essential hypertension   4. Hyperlipidemia, unspecified hyperlipidemia type   5. Chronic obstructive pulmonary disease, unspecified COPD type (HCC)   6. Tobacco abuse   7. Abdominal aortic aneurysm (AAA) without rupture, unspecified part (HCC)   8. History of pulmonary embolism     PLAN:    In order of problems listed above:  Severe AS s/p TAVR:  -- He has a history of HALT/HAM that resolved on full dose Eliquis.  -- His Eliquis dose was decreased to 2.5mg  BID due to easy bruising and bleeding and gradients went back up.  -- Echo today shows persistently elevated gradients ~25 mmhg.  -- Will have him go back on therapeutic dose Eliquis 5mg  BID and recheck an echo in 3 months.  -- Follow up with Dr. Excell Seltzer after echo.    NICM/HFimpEF:  -- EF normalized after TAVR.  -- He has no s/s of CHF.  -- Continue on Toprol XL 12.5mg  daily.   HTN: -- BP well controlled today.  -- Continue Toprol XL 12.5mg  daily. -- Continue amlodipine 5mg  daily.    HLD:  -- Continue atorvastatin 40mg  daily.  -- Continue Vascepa 2g BID.    COPD/Tobacco Abuse:  -- PFTs with severe COPD.  -- Still smoking 1.5 PPD.  -- Nicotine patches haven't helped him in the past, but he is willing to try again.  -- Failed Chantix and Wellbutrin in the past.  -- Spent 5 minutes discussing tobacco cessation and he will try OTC nicotine patches.    AAA:  -- Pre TAVR CTs showed a 3.1 cm infrarenal abdominal aortic aneurysm.  -- Recommended [place] q3 years. -- Repeat US due 03/2025.  Hx of PE:  -- Increase Eliquis 2.5mg  BID to 5mg  BID today.    Thrombocytopenia: --PLT 62 on last check at the cancer center in  05/2023 (personally reviewed).  -- Follows with Dr. Leonides Schanz- apt in April with labs.    Medication Adjustments/Labs and Tests Ordered: Current medicines are reviewed at length with the patient today.  Concerns regarding medicines  are outlined above.  Orders Placed This Encounter  Procedures   ECHOCARDIOGRAM COMPLETE   Meds ordered this encounter  Medications   DISCONTD: apixaban (ELIQUIS) 2.5 MG TABS tablet    Sig: Take 2 tablets (5 mg total) by mouth 2 (two) times daily.    Dispense:  180 tablet    Refill:  3    Dose increase   apixaban (ELIQUIS) 5 MG TABS tablet    Sig: Take 1 tablet (5 mg total) by mouth 2 (two) times daily.    Dispense:  60 tablet    Refill:  11    Patient Instructions  Medication Instructions:  Your physician has recommended you make the following change in your medication:  INCREASE ELIQUIS TO 5 MG TWICE DAILY  *If you need a refill on your cardiac medications before your next appointment, please call your pharmacy*   Lab Work: NONE If you have labs (blood work) drawn today and your tests are completely normal, you will receive your results only by: MyChart Message (if you have MyChart) OR A paper copy in the mail If you have any lab test that is abnormal or we need to change your treatment, we will call you to review the results.   Testing/Procedures: Your physician has requested that you have an echocardiogram. Echocardiography is a painless test that uses sound waves to create images of your heart. It provides your doctor with information about the size and shape of your heart and how well your heart's chambers and valves are working. This procedure takes approximately one hour. There are no restrictions for this procedure. Please do NOT wear cologne, perfume, aftershave, or lotions (deodorant is allowed). Please arrive 15 minutes prior to your appointment time.  Please note: We ask at that you not bring children with you during ultrasound (echo/ vascular) testing. Due to room size and safety concerns, children are not allowed in the ultrasound rooms during exams. Our front office staff cannot provide observation of children in our lobby area while testing is being conducted. An  adult accompanying a patient to their appointment will only be allowed in the ultrasound room at the discretion of the ultrasound technician under special circumstances. We apologize for any inconvenience.    Follow-Up: At [facility], you and your health needs are our priority.  As part of our continuing mission to provide you with exceptional heart care, we have created designated Provider Care Teams.  These Care Teams include your primary Cardiologist (physician) and Advanced Practice Providers (APPs -  Physician Assistants and Nurse Practitioners) who all work together to provide you with the care you need, when you need it.  We recommend signing up for the patient portal called "MyChart".  Sign up information is provided on this After Visit Summary.  MyChart is used to connect with patients for Virtual Visits (Telemedicine).  Patients are able to view lab/test results, encounter notes, upcoming appointments, etc.  Non-urgent messages can be sent to your provider as well.   To learn more about what you can do with MyChart, go to [email or web address].    Your next appointment:   3 month(s)  Provider:   Tonny Bollman, MD         Signed, Cline Crock, PA-C  11/16/2023 5:28 PM  [facility]

## 2023-11-16 ENCOUNTER — Ambulatory Visit: Payer: BC Managed Care – PPO | Attending: Physician Assistant

## 2023-11-16 ENCOUNTER — Ambulatory Visit (INDEPENDENT_AMBULATORY_CARE_PROVIDER_SITE_OTHER): Payer: BC Managed Care – PPO | Admitting: Physician Assistant

## 2023-11-16 VITALS — BP 114/74 | HR 63 | Ht 71.0 in | Wt 210.2 lb

## 2023-11-16 DIAGNOSIS — Z952 Presence of prosthetic heart valve: Secondary | ICD-10-CM | POA: Insufficient documentation

## 2023-11-16 DIAGNOSIS — F17200 Nicotine dependence, unspecified, uncomplicated: Secondary | ICD-10-CM | POA: Diagnosis not present

## 2023-11-16 DIAGNOSIS — J449 Chronic obstructive pulmonary disease, unspecified: Secondary | ICD-10-CM | POA: Insufficient documentation

## 2023-11-16 DIAGNOSIS — E785 Hyperlipidemia, unspecified: Secondary | ICD-10-CM

## 2023-11-16 DIAGNOSIS — I714 Abdominal aortic aneurysm, without rupture, unspecified: Secondary | ICD-10-CM | POA: Diagnosis not present

## 2023-11-16 DIAGNOSIS — I10 Essential (primary) hypertension: Secondary | ICD-10-CM

## 2023-11-16 DIAGNOSIS — Z86711 Personal history of pulmonary embolism: Secondary | ICD-10-CM | POA: Insufficient documentation

## 2023-11-16 DIAGNOSIS — Z720 Tobacco use: Secondary | ICD-10-CM | POA: Diagnosis not present

## 2023-11-16 DIAGNOSIS — I5032 Chronic diastolic (congestive) heart failure: Secondary | ICD-10-CM | POA: Diagnosis not present

## 2023-11-16 LAB — ECHOCARDIOGRAM COMPLETE
AR max vel: 0.86 cm2
AV Area VTI: 0.76 cm2
AV Area mean vel: 0.87 cm2
AV Mean grad: 25 mmHg
AV Peak grad: 39.9 mmHg
Ao pk vel: 3.16 m/s
Area-P 1/2: 3.56 cm2
S' Lateral: 3.6 cm

## 2023-11-16 MED ORDER — APIXABAN 2.5 MG PO TABS: 5.0000 mg | ORAL_TABLET | Freq: Two times a day (BID) | ORAL | 3 refills | Status: DC

## 2023-11-16 MED ORDER — APIXABAN 5 MG PO TABS: 5.0000 mg | ORAL_TABLET | Freq: Two times a day (BID) | ORAL | 11 refills | Status: AC

## 2023-11-16 NOTE — Patient Instructions (Addendum)
 Medication Instructions:  Your physician has recommended you make the following change in your medication:  INCREASE ELIQUIS TO 5 MG TWICE DAILY  *If you need a refill on your cardiac medications before your next appointment, please call your pharmacy*   Lab Work: NONE If you have labs (blood work) drawn today and your tests are completely normal, you will receive your results only by: MyChart Message (if you have MyChart) OR A paper copy in the mail If you have any lab test that is abnormal or we need to change your treatment, we will call you to review the results.   Testing/Procedures: Your physician has requested that you have an echocardiogram. Echocardiography is a painless test that uses sound waves to create images of your heart. It provides your doctor with information about the size and shape of your heart and how well your heart's chambers and valves are working. This procedure takes approximately one hour. There are no restrictions for this procedure. Please do NOT wear cologne, perfume, aftershave, or lotions (deodorant is allowed). Please arrive 15 minutes prior to your appointment time.  Please note: We ask at that you not bring children with you during ultrasound (echo/ vascular) testing. Due to room size and safety concerns, children are not allowed in the ultrasound rooms during exams. Our front office staff cannot provide observation of children in our lobby area while testing is being conducted. An adult accompanying a patient to their appointment will only be allowed in the ultrasound room at the discretion of the ultrasound technician under special circumstances. We apologize for any inconvenience.    Follow-Up: At [facility], you and your health needs are our priority.  As part of our continuing mission to provide you with exceptional heart care, we have created designated Provider Care Teams.  These Care Teams include your primary Cardiologist (physician) and  Advanced Practice Providers (APPs -  Physician Assistants and Nurse Practitioners) who all work together to provide you with the care you need, when you need it.  We recommend signing up for the patient portal called "MyChart".  Sign up information is provided on this After Visit Summary.  MyChart is used to connect with patients for Virtual Visits (Telemedicine).  Patients are able to view lab/test results, encounter notes, upcoming appointments, etc.  Non-urgent messages can be sent to your provider as well.   To learn more about what you can do with MyChart, go to [email or web address].    Your next appointment:   3 month(s)  Provider:   Tonny Bollman, MD

## 2023-11-24 ENCOUNTER — Inpatient Hospital Stay: Payer: BC Managed Care – PPO | Admitting: Hematology and Oncology

## 2023-11-24 ENCOUNTER — Inpatient Hospital Stay: Payer: BC Managed Care – PPO | Attending: Hematology and Oncology

## 2023-11-24 ENCOUNTER — Other Ambulatory Visit: Payer: Self-pay | Admitting: Hematology and Oncology

## 2023-11-24 DIAGNOSIS — D696 Thrombocytopenia, unspecified: Secondary | ICD-10-CM

## 2023-11-24 DIAGNOSIS — F17210 Nicotine dependence, cigarettes, uncomplicated: Secondary | ICD-10-CM | POA: Insufficient documentation

## 2023-11-24 DIAGNOSIS — Z809 Family history of malignant neoplasm, unspecified: Secondary | ICD-10-CM | POA: Insufficient documentation

## 2023-11-24 DIAGNOSIS — Z85038 Personal history of other malignant neoplasm of large intestine: Secondary | ICD-10-CM | POA: Insufficient documentation

## 2023-11-24 DIAGNOSIS — I2699 Other pulmonary embolism without acute cor pulmonale: Secondary | ICD-10-CM | POA: Insufficient documentation

## 2023-11-24 DIAGNOSIS — I252 Old myocardial infarction: Secondary | ICD-10-CM | POA: Insufficient documentation

## 2023-11-24 NOTE — Progress Notes (Unsigned)
 No show

## 2023-11-25 ENCOUNTER — Telehealth: Payer: Self-pay | Admitting: Hematology and Oncology

## 2023-11-25 NOTE — Telephone Encounter (Signed)
 LM for patient to call and reschedule to 4/1 appt.

## 2023-11-26 ENCOUNTER — Ambulatory Visit: Payer: BC Managed Care – PPO | Admitting: Hematology and Oncology

## 2023-11-26 ENCOUNTER — Other Ambulatory Visit: Payer: BC Managed Care – PPO

## 2023-11-26 ENCOUNTER — Telehealth: Payer: Self-pay | Admitting: Hematology and Oncology

## 2023-12-02 ENCOUNTER — Other Ambulatory Visit

## 2023-12-02 ENCOUNTER — Ambulatory Visit: Admitting: Hematology and Oncology

## 2023-12-08 NOTE — Progress Notes (Unsigned)
 [facility] Telephone:([phone]) [phone]   Fax:([phone]  PROGRESS NOTE  Patient Care Team: Wilmon Pali, FNP as PCP - General (Family Medicine) Tonny Bollman, MD as PCP - Cardiology (Cardiology)  Hematological/Oncological History # Acute Pulmonary Embolism 04/17/2022: underwent TAVR after presenting with an NSTEMI and work-up revealed severe aortic stenosis.  06/02/2022: underwent cardiac CT did reveal a leaflet thrombosis along with an acute pulmonary embolus of the distal right pulmonary artery with extension into the right upper lobe lobar artery.  Patient was initiated on Eliquis therapy  06/25/2022: Establish care with [facility] health hematology.  Interval History:  Bob Ross 58 y.o. male with medical history significant for pulmonary embolism/thrombocytopenia who presents for a follow up visit. The patient's last visit was on 05/28/2023. In the interim since the last visit he has continued on Eliquis therapy.  On exam today Bob Ross reports he has been well overall in the interim since her last visit.  After his most recent echocardiogram was recommended he increase his Eliquis dose to 5 mg twice daily.  He reports has had no signs or symptoms concerning for clot such as leg pain, leg swelling, chest pain, shortness of breath.  He is tolerating Eliquis well with no bleeding, bruising, or dark stools.  He denies any blood in the urine or stool.  He reports medication is costing him $90 for a 90-day supply at this time.  He reports that he is having some runny nose from the pollen but no sore throat or cough.  He is not having any other infectious symptoms such as fevers, chills, sweats.. Otherwise he has been his baseline level of health and has no questions concerns or complaints today.  He notes that he feels well and denies any fevers, chills, sweats, nausea, vomiting or diarrhea.  A full 10 point ROS is otherwise negative.  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 Excell Seltzer & Dr. Laneta Simmers   Severe aortic stenosis     SURGICAL HISTORY: Past Surgical History:  Procedure Laterality Date   COLON SURGERY     COLOSTOMY     COLOSTOMY REVERSAL     HERNIA REPAIR     INTRAOPERATIVE TRANSTHORACIC ECHOCARDIOGRAM N/A 04/17/2022   Procedure: INTRAOPERATIVE TRANSTHORACIC ECHOCARDIOGRAM;  Surgeon: Tonny Bollman, MD;  Location: [facility] OR;  Service: Open Heart Surgery;  Laterality: N/A;   LEFT HEART CATH AND CORONARY ANGIOGRAPHY N/A 04/09/2022   Procedure: LEFT HEART CATH AND CORONARY ANGIOGRAPHY;  Surgeon: Orbie Pyo, MD;  Location: [facility] INVASIVE CV LAB;  Service: Cardiovascular;  Laterality: N/A;   LIVER RESECTION     TRANSCATHETER AORTIC VALVE REPLACEMENT, TRANSFEMORAL N/A 04/17/2022   Procedure: Transcatheter Aortic Valve Replacement, Transfemoral;  Surgeon: Tonny Bollman, MD;  Location: [facility] OR;  Service: Open Heart Surgery;  Laterality: N/A;    SOCIAL HISTORY: Social History   Socioeconomic History   Marital status: Married    Spouse name: Not on file  Number of children: Not on file   Years of education: Not on file   Highest education level: Not on file  Occupational History   Not on file  Tobacco Use   Smoking status: Every Day    Current packs/day: 1.50    Average packs/day: 1.5 packs/day for 45.0 years (67.5 ttl pk-yrs)    Types: Cigarettes   Smokeless tobacco: Never  Substance and Sexual Activity   Alcohol use: Yes    Comment: Occasional   Drug use: No    Sexual activity: Not on file  Other Topics Concern   Not on file  Social History Narrative   Not on file   Social Drivers of Health   Financial Resource Strain: Not on file  Food Insecurity: Not on file  Transportation Needs: Not on file  Physical Activity: Not on file  Stress: Not on file  Social Connections: Not on file  Intimate Partner Violence: Not on file    FAMILY HISTORY: Family History  Problem Relation Age of Onset   AAA (abdominal aortic aneurysm) Mother    Cancer Father     ALLERGIES:  is allergic to tiotropium bromide monohydrate.  MEDICATIONS:  Current Outpatient Medications  Medication Sig Dispense Refill   albuterol (VENTOLIN HFA) 108 (90 Base) MCG/ACT inhaler Inhale 2 puffs into the lungs every 4 (four) hours as needed for wheezing or shortness of breath. 1 each 3   amLODipine (NORVASC) 5 MG tablet Take 1 tablet (5 mg total) by mouth daily. 90 tablet 3   apixaban (ELIQUIS) 5 MG TABS tablet Take 1 tablet (5 mg total) by mouth 2 (two) times daily. 60 tablet 11   atorvastatin (LIPITOR) 40 MG tablet Take 1 tablet by mouth once daily 90 tablet 3   cholecalciferol (VITAMIN D3) 25 MCG (1000 UNIT) tablet Take 1,000 Units by mouth daily.     Cyanocobalamin (B-12 PO) Take 1 capsule by mouth daily.     Fluticasone-Umeclidin-Vilant (TRELEGY ELLIPTA) 100-62.5-25 MCG/ACT AEPB Inhale 1 puff into the lungs daily.     icosapent Ethyl (VASCEPA) 1 g capsule Take 2 capsules (2 g total) by mouth 2 (two) times daily. 120 capsule 10   metoprolol succinate (TOPROL-XL) 25 MG 24 hr tablet Take 1/2 (one-half) tablet by mouth once daily 45 tablet 2   senna-docusate (SENNA S) 8.6-50 MG tablet Take 1-2 tablets by mouth See admin instructions. Take 2 tablets in the morning and 2 tablet at night     traMADol (ULTRAM) 50 MG tablet Take 1 tablet (50 mg total) by mouth every 6 (six) hours as needed. 15 tablet 0   No current facility-administered medications for this visit.    REVIEW OF  SYSTEMS:   Constitutional: ( - ) fevers, ( - )  chills , ( - ) night sweats Eyes: ( - ) blurriness of vision, ( - ) double vision, ( - ) watery eyes Ears, nose, mouth, throat, and face: ( - ) mucositis, ( - ) sore throat Respiratory: ( - ) cough, ( - ) dyspnea, ( - ) wheezes Cardiovascular: ( - ) palpitation, ( - ) chest discomfort, ( - ) lower extremity swelling Gastrointestinal:  ( - ) nausea, ( - ) heartburn, ( - ) change in bowel habits Skin: ( - ) abnormal skin rashes Lymphatics: ( - ) new lymphadenopathy, ( - ) easy bruising Neurological: ( - ) numbness, ( - ) tingling, ( - ) new weaknesses Behavioral/Psych: ( - ) mood change, ( - )  new changes  All other systems were reviewed with the patient and are negative.  PHYSICAL EXAMINATION:  Vitals:   12/09/23 0829  BP: (!) 146/94  Pulse: 70  Resp: 14  Temp: (!) 97.3 F (36.3 C)  SpO2: 98%    Filed Weights   12/09/23 0829  Weight: 211 lb 8 oz (95.9 kg)     GENERAL: Well-appearing middle-aged Caucasian male alert, no distress and comfortable SKIN: skin color, texture, turgor are normal, no rashes or significant lesions EYES: conjunctiva are pink and non-injected, sclera clear LUNGS: clear to auscultation and percussion with normal breathing effort HEART: regular rate & rhythm and no murmurs and no lower extremity edema Musculoskeletal: no cyanosis of digits and no clubbing  PSYCH: alert & oriented x 3, fluent speech NEURO: no focal motor/sensory deficits  LABORATORY DATA:  I have reviewed the data as listed    Latest Ref Rng & Units 12/09/2023    7:52 AM 05/28/2023    9:09 AM 11/18/2022    9:47 AM  CBC  WBC 4.0 - 10.5 K/uL 7.9  7.8  7.0   Hemoglobin 13.0 - 17.0 g/dL 16.1  09.6  04.5   Hematocrit 39.0 - 52.0 % 52.6  53.0  53.1   Platelets 150 - 400 K/uL 65  62  74        Latest Ref Rng & Units 12/09/2023    7:52 AM 05/28/2023    9:09 AM 11/18/2022    9:47 AM  CMP  Glucose 70 - 99 mg/dL 98  85  74   BUN 6 - 20 mg/dL  10  13  12    Creatinine 0.61 - 1.24 mg/dL 4.09  8.11  9.14   Sodium 135 - 145 mmol/L 142  142  141   Potassium 3.5 - 5.1 mmol/L 4.4  4.1  4.3   Chloride 98 - 111 mmol/L 107  107  107   CO2 22 - 32 mmol/L 31  29  30    Calcium 8.9 - 10.3 mg/dL 9.4  9.5  9.7   Total Protein 6.5 - 8.1 g/dL 7.2  7.0  7.3   Total Bilirubin 0.0 - 1.2 mg/dL 0.8  0.9  0.9   Alkaline Phos 38 - 126 U/L 95  92  88   AST 15 - 41 U/L 26  25  25    ALT 0 - 44 U/L 32  25  21     RADIOGRAPHIC STUDIES: ECHOCARDIOGRAM COMPLETE Result Date: 11/16/2023    ECHOCARDIOGRAM REPORT   Patient Name:   Bob Ross     Date of Exam: 11/16/2023 Medical Rec #:  [id number]     Height:       71.5 in Accession #:    [id number]    Weight:       210.2 lb Date of Birth:  [birth year]      BSA:          2.165 m Patient Age:    57 years      BP:           114/74 mmHg Patient Gender: M             HR:           71 bpm. Exam Location:  [address] Procedure: 2D Echo, 3D Echo, Cardiac Doppler and Color Doppler (Both Spectral            and Color Flow Doppler were utilized during procedure). Indications:    Z95.2  Status Post TAVR  History:        Patient has prior history of Echocardiogram examinations, most                 recent 04/15/2023. CAD and Previous Myocardial Infarction, Aortic                 Valve Disease; Risk Factors:Current Smoker, Dyslipidemia and                 Hypertension. Aortic Stenosis status post TAVR (26mm Edwards                 S3UR- 04/17/22), History of Colon CA with METS to LIver and Lymph                 Nodes (2005, Surgical Resection and Chemotherapy).                 Aortic Valve: valve is present in the aortic position.  Sonographer:    Ewing Holiday RDCS Referring Phys: GINA L COLLINS IMPRESSIONS  1. Left ventricular ejection fraction, by estimation, is 55 to 60%. The left ventricle has normal function. The left ventricle has no regional wall motion abnormalities. There is mild left ventricular hypertrophy. Left ventricular  diastolic parameters were normal.  2. Right ventricular systolic function is normal. The right ventricular size is normal.  3. The mitral valve is grossly normal. Trivial mitral valve regurgitation. No evidence of mitral stenosis.  4. The aortic valve has been repaired/replaced. Aortic valve regurgitation is trivial. Moderate aortic valve stenosis. There is a valve present in the aortic position. Aortic valve mean gradient measures 25.0 mmHg.  5. The inferior vena cava is normal in size with greater than 50% respiratory variability, suggesting right atrial pressure of 3 mmHg. Comparison(s): Changes from prior study are noted. TAVR valve mean gradients continue to rise, from 12 in 11/2022 to 21 in 03/2023 to 25 mmHg today. Given prior history of HALT/HAM, consider gated cardiac CT for further evaluation. Recommendations communicated to Abagail Hoar, PA. FINDINGS  Left Ventricle: Left ventricular ejection fraction, by estimation, is 55 to 60%. The left ventricle has normal function. The left ventricle has no regional wall motion abnormalities. 3D ejection fraction reviewed and evaluated as part of the interpretation. Alternate measurement of EF is felt to be most reflective of LV function. The left ventricular internal cavity size was normal in size. There is mild left ventricular hypertrophy. Left ventricular diastolic parameters were normal. Right Ventricle: The right ventricular size is normal. Right vetricular wall thickness was not well visualized. Right ventricular systolic function is normal. Left Atrium: Left atrial size was normal in size. Right Atrium: Right atrial size was normal in size. Pericardium: There is no evidence of pericardial effusion. Mitral Valve: The mitral valve is grossly normal. Trivial mitral valve regurgitation. No evidence of mitral valve stenosis. Tricuspid Valve: The tricuspid valve is grossly normal. Tricuspid valve regurgitation is trivial. No evidence of tricuspid stenosis. Aortic  Valve: Prior TAVR gradient 21 mmHg 03/2023, 12 in 11/2022. The aortic valve has been repaired/replaced. Aortic valve regurgitation is trivial. Moderate aortic stenosis is present. Aortic valve mean gradient measures 25.0 mmHg. Aortic valve peak gradient measures 39.9 mmHg. Aortic valve area, by VTI measures 0.76 cm. There is a valve present in the aortic position. Pulmonic Valve: The pulmonic valve was not well visualized. Pulmonic valve regurgitation is not visualized. Aorta: The aortic root, ascending aorta, aortic arch and descending aorta are all structurally normal, with  no evidence of dilitation or obstruction. Venous: The inferior vena cava is normal in size with greater than 50% respiratory variability, suggesting right atrial pressure of 3 mmHg. IAS/Shunts: The atrial septum is grossly normal. Additional Comments: 3D was performed not requiring image post processing on an independent workstation and was normal.  LEFT VENTRICLE PLAX 2D LVIDd:         5.25 cm   Diastology LVIDs:         3.60 cm   LV e' medial:    7.40 cm/s LV PW:         1.10 cm   LV E/e' medial:  7.5 LV IVS:        0.75 cm   LV e' lateral:   6.85 cm/s LVOT diam:     2.40 cm   LV E/e' lateral: 8.1 LV SV:         59 LV SV Index:   27 LVOT Area:     4.52 cm                           3D Volume EF:                          3D EF:        63 %                          LV EDV:       135 ml                          LV ESV:       51 ml                          LV SV:        84 ml RIGHT VENTRICLE RV Basal diam:  3.70 cm RV S prime:     12.90 cm/s TAPSE (M-mode): 2.6 cm LEFT ATRIUM             Index        RIGHT ATRIUM           Index LA diam:        3.40 cm 1.57 cm/m   RA Pressure: 3.00 mmHg LA Vol (A2C):   54.5 ml 25.17 ml/m  RA Area:     15.20 cm LA Vol (A4C):   33.3 ml 15.38 ml/m  RA Volume:   43.10 ml  19.91 ml/m LA Biplane Vol: 46.1 ml 21.29 ml/m  AORTIC VALVE AV Area (Vmax):    0.86 cm AV Area (Vmean):   0.87 cm AV Area (VTI):     0.76  cm AV Vmax:           316.00 cm/s AV Vmean:          219.750 cm/s AV VTI:            0.766 m AV Peak Grad:      39.9 mmHg AV Mean Grad:      25.0 mmHg LVOT Vmax:         59.95 cm/s LVOT Vmean:        42.050 cm/s LVOT VTI:          0.130 m LVOT/AV VTI ratio: 0.17  AORTA Ao Root diam: 3.20 cm Ao Asc diam:  3.70 cm MITRAL VALVE               TRICUSPID VALVE MV Area (PHT): cm         Estimated RAP:  3.00 mmHg MV Decel Time: 213 msec MV E velocity: 55.55 cm/s  SHUNTS MV A velocity: 78.10 cm/s  Systemic VTI:  0.13 m MV E/A ratio:  0.71        Systemic Diam: 2.40 cm Jodelle Red MD Electronically signed by Jodelle Red MD Signature Date/Time: 11/16/2023/4:02:13 PM    Final     ASSESSMENT & PLAN Bob Ross is a 59 y.o. male who presents to the hematology clinic for evaluation of acute pulmonary embolism. We reviewed possible etiologies for venous thromboembolisms including prolonged travel/immobility, surgery (particular abdominal or orthropedic), trauma,  and pregnancy/ estrogen containing birth control. After a detailed history and review of the records there is no clear provoking factor for this patient's VTE.  Patients with unprovoked VTEs have up to 25% recurrence after 5 years and 36% at 10 years, with 4% of these clots being fatal ([phone]). Therefore the formal recommendation for unprovoked VTE's is lifelong anticoagulation, as the cause may not be transient or reversible. We recommend 6 months or full strength anticoagulation with a re-evaluation after that time.  The patient's will then have a choice of maintenance dose DOAC (preferred, recommended), 81mg  ASA PO daily (non-preferred), or no further anticoagulation (not recommended).    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  Dose  increased per cardiology.  Continue with this dosage. --patient denies any bleeding, bruising, or dark stools on this medication. It is well tolerated. No difficulties accessing/affording the medication --Labs today show white blood cell count 7.9, hemoglobin 17.7, MCV 90.5, platelet 65.  Creatinine 1.25.  Okay to continue on full-strength Eliquis therapy as long as platelets are greater than 50 --RTC in 6 months' time with strict return precautions for overt signs of bleeding.   #Thrombocytopenia -- Nutritional and viral workup were both negative. -- Patient has a low immature platelet fraction, does not support the diagnosis of ITP -- Recommend pursuing a ultrasound of the abdomen to assess for liver disease/splenomegaly.  Patient declined to have this performed.  We do have prior imaging of the abdomen from August 2023 which shows no evidence of cirrhosis or splenomegaly. 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  Patient reports a large portion of his liver was removed --Patient has large ventral hernia secondary to his liver operation. -- Continue to monitor  No orders of the defined types were placed in this encounter.   All questions were answered. The patient knows to call the clinic with any problems, questions or concerns.  A total of more than 25 minutes were spent on this encounter with face-to-face time and non-face-to-face time, including preparing to see the patient, ordering tests and/or medications, counseling the patient and coordination of care as outlined above.   Ulysees Barns, MD Department of Hematology/Oncology [facility] Phone: [phone] Pager: [phone] Email: [email or web address]  12/09/2023 8:50 AM

## 2023-12-09 ENCOUNTER — Inpatient Hospital Stay (HOSPITAL_BASED_OUTPATIENT_CLINIC_OR_DEPARTMENT_OTHER): Admitting: Hematology and Oncology

## 2023-12-09 ENCOUNTER — Inpatient Hospital Stay

## 2023-12-09 VITALS — BP 146/94 | HR 70 | Temp 97.3°F | Resp 14 | Wt 211.5 lb

## 2023-12-09 DIAGNOSIS — D696 Thrombocytopenia, unspecified: Secondary | ICD-10-CM

## 2023-12-09 DIAGNOSIS — I2699 Other pulmonary embolism without acute cor pulmonale: Secondary | ICD-10-CM

## 2023-12-09 DIAGNOSIS — F17210 Nicotine dependence, cigarettes, uncomplicated: Secondary | ICD-10-CM | POA: Diagnosis not present

## 2023-12-09 DIAGNOSIS — I252 Old myocardial infarction: Secondary | ICD-10-CM | POA: Diagnosis not present

## 2023-12-09 DIAGNOSIS — Z809 Family history of malignant neoplasm, unspecified: Secondary | ICD-10-CM | POA: Diagnosis not present

## 2023-12-09 DIAGNOSIS — Z85038 Personal history of other malignant neoplasm of large intestine: Secondary | ICD-10-CM | POA: Diagnosis not present

## 2023-12-09 LAB — CBC WITH DIFFERENTIAL (CANCER CENTER ONLY)
Abs Immature Granulocytes: 0.03 10*3/uL (ref 0.00–0.07)
Basophils Absolute: 0 10*3/uL (ref 0.0–0.1)
Basophils Relative: 1 %
Eosinophils Absolute: 0.2 10*3/uL (ref 0.0–0.5)
Eosinophils Relative: 2 %
HCT: 52.6 % — ABNORMAL HIGH (ref 39.0–52.0)
Hemoglobin: 17.7 g/dL — ABNORMAL HIGH (ref 13.0–17.0)
Immature Granulocytes: 0 %
Lymphocytes Relative: 33 %
Lymphs Abs: 2.6 10*3/uL (ref 0.7–4.0)
MCH: 30.5 pg (ref 26.0–34.0)
MCHC: 33.7 g/dL (ref 30.0–36.0)
MCV: 90.5 fL (ref 80.0–100.0)
Monocytes Absolute: 0.7 10*3/uL (ref 0.1–1.0)
Monocytes Relative: 9 %
Neutro Abs: 4.3 10*3/uL (ref 1.7–7.7)
Neutrophils Relative %: 55 %
Platelet Count: 65 10*3/uL — ABNORMAL LOW (ref 150–400)
RBC: 5.81 MIL/uL (ref 4.22–5.81)
RDW: 13.7 % (ref 11.5–15.5)
WBC Count: 7.9 10*3/uL (ref 4.0–10.5)
nRBC: 0 % (ref 0.0–0.2)

## 2023-12-09 LAB — CMP (CANCER CENTER ONLY)
ALT: 32 U/L (ref 0–44)
AST: 26 U/L (ref 15–41)
Albumin: 4.2 g/dL (ref 3.5–5.0)
Alkaline Phosphatase: 95 U/L (ref 38–126)
Anion gap: 4 — ABNORMAL LOW (ref 5–15)
BUN: 10 mg/dL (ref 6–20)
CO2: 31 mmol/L (ref 22–32)
Calcium: 9.4 mg/dL (ref 8.9–10.3)
Chloride: 107 mmol/L (ref 98–111)
Creatinine: 1.25 mg/dL — ABNORMAL HIGH (ref 0.61–1.24)
GFR, Estimated: >60 mL/min (ref >60)
Glucose, Bld: 98 mg/dL (ref 70–99)
Potassium: 4.4 mmol/L (ref 3.5–5.1)
Sodium: 142 mmol/L (ref 135–145)
Total Bilirubin: 0.8 mg/dL (ref 0.0–1.2)
Total Protein: 7.2 g/dL (ref 6.5–8.1)

## 2023-12-16 ENCOUNTER — Other Ambulatory Visit: Payer: Self-pay | Admitting: Cardiovascular Disease

## 2024-02-05 ENCOUNTER — Other Ambulatory Visit: Payer: Self-pay | Admitting: Cardiovascular Disease

## 2024-02-08 ENCOUNTER — Other Ambulatory Visit (HOSPITAL_COMMUNITY)

## 2024-02-09 DIAGNOSIS — M5430 Sciatica, unspecified side: Secondary | ICD-10-CM | POA: Diagnosis not present

## 2024-02-09 DIAGNOSIS — Z23 Encounter for immunization: Secondary | ICD-10-CM | POA: Diagnosis not present

## 2024-02-09 DIAGNOSIS — Z7901 Long term (current) use of anticoagulants: Secondary | ICD-10-CM | POA: Diagnosis not present

## 2024-02-09 DIAGNOSIS — J449 Chronic obstructive pulmonary disease, unspecified: Secondary | ICD-10-CM | POA: Diagnosis not present

## 2024-02-09 DIAGNOSIS — I2510 Atherosclerotic heart disease of native coronary artery without angina pectoris: Secondary | ICD-10-CM | POA: Diagnosis not present

## 2024-02-16 ENCOUNTER — Other Ambulatory Visit (HOSPITAL_COMMUNITY)

## 2024-02-22 DIAGNOSIS — Z1211 Encounter for screening for malignant neoplasm of colon: Secondary | ICD-10-CM | POA: Diagnosis not present

## 2024-02-22 DIAGNOSIS — K5909 Other constipation: Secondary | ICD-10-CM | POA: Diagnosis not present

## 2024-02-22 DIAGNOSIS — Z85038 Personal history of other malignant neoplasm of large intestine: Secondary | ICD-10-CM | POA: Diagnosis not present

## 2024-03-16 ENCOUNTER — Ambulatory Visit
Admission: RE | Admit: 2024-03-16 | Discharge: 2024-03-16 | Disposition: A | Discharge status: Home / Self Care | Source: Ambulatory Visit | Attending: Internal Medicine | Admitting: Internal Medicine

## 2024-03-16 ENCOUNTER — Ambulatory Visit: Payer: Self-pay

## 2024-03-16 ENCOUNTER — Encounter: Payer: Self-pay | Admitting: Cardiovascular Disease

## 2024-03-16 ENCOUNTER — Ambulatory Visit (INDEPENDENT_AMBULATORY_CARE_PROVIDER_SITE_OTHER): Admitting: Cardiovascular Disease

## 2024-03-16 VITALS — BP 132/86 | HR 70 | Ht 71.0 in

## 2024-03-16 DIAGNOSIS — I10 Essential (primary) hypertension: Secondary | ICD-10-CM

## 2024-03-16 DIAGNOSIS — Z952 Presence of prosthetic heart valve: Secondary | ICD-10-CM

## 2024-03-16 DIAGNOSIS — Z720 Tobacco use: Secondary | ICD-10-CM | POA: Diagnosis not present

## 2024-03-16 LAB — ECHOCARDIOGRAM COMPLETE
AR max vel: 2.16 cm2
AV Area VTI: 1.99 cm2
AV Area mean vel: 1.91 cm2
AV Mean grad: 21 mmHg
AV Peak grad: 32.5 mmHg
Ao pk vel: 2.85 m/s
Area-P 1/2: 3.65 cm2
S' Lateral: 3.83 cm

## 2024-03-16 NOTE — Patient Instructions (Signed)
 Medication Instructions:  NO Changes *If you need a refill on your cardiac medications before your next appointment, please call your pharmacy*  Lab Work: None If you have labs (blood work) drawn today and your tests are completely normal, you will receive your results only by: MyChart Message (if you have MyChart) OR A paper copy in the mail If you have any lab test that is abnormal or we need to change your treatment, we will call you to review the results.  Testing/Procedures: Echo before 1 year follow-up.  Follow-Up: At [facility], you and your health needs are our priority.  As part of our continuing mission to provide you with exceptional heart care, our providers are all part of one team.  This team includes your primary Cardiologist (physician) and Advanced Practice Providers or APPs (Physician Assistants and Nurse Practitioners) who all work together to provide you with the care you need, when you need it.  Your next appointment:   1 year(s)  Provider:   Ozell Fell, MD    We recommend signing up for the patient portal called MyChart.  Sign up information is provided on this After Visit Summary.  MyChart is used to connect with patients for Virtual Visits (Telemedicine).  Patients are able to view lab/test results, encounter notes, upcoming appointments, etc.  Non-urgent messages can be sent to your provider as well.   To learn more about what you can do with MyChart, go to [email or web address].   Other Instructions None

## 2024-03-16 NOTE — Assessment & Plan Note (Signed)
 Cessation counseling done.  We discussed nicotine  replacement.  Patient has had limited success with this in the past.  He is going to try hypnotism.  I emphasized the importance of complete tobacco cessation with him.  Overall, I think Bob Ross will need to remain on oral anticoagulation.  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  His TAVR valve function is normal on therapeutic anticoagulation and this will be continued.

## 2024-03-16 NOTE — Progress Notes (Signed)
 Cardiology Office Note:    Date:  03/16/2024   ID:  Bob Ross, DOB 1966/01/23, MRN [id number]  PCP:  Gerome Tillman CROME, FNP   [facility] Providers Cardiologist:  Ozell Fell, MD     Referring MD: Gerome Tillman CROME, FNP   Chief Complaint  Patient presents with   Follow-up    Aortic Stenosis    History of Present Illness:    Bob Ross is a 58 y.o. male with a hx of aortic stenosis status post TAVR in 2023.  The patient has a complicated medical history with COPD and heavy tobacco abuse, metastatic colon cancer treated with radical surgery and chemotherapy status post multiple abdominal surgeries and residual abdominal wall hernia, and pulmonary embolism.  He presented with acute heart failure and non-STEMI in 2023 and was found to have severe aortic stenosis.  After multidisciplinary review he underwent TAVR, initially with a mean gradient of 12 mmHg.  He then had evidence of subacute leaflet thrombosis treated with oral anticoagulation and subsequent improvement in his mean gradients.  He has now been on and off of anticoagulation with worsening gradients on the lower dose of apixaban .  When he was last seen in March 2025 his apixaban  was increased back to full dose and he presents today for an echocardiogram and office visit.  The patient is doing fairly well.  He does have some persistent shortness of breath with activity and relates this to his smoking.  He has no orthopnea, PND, chest pain, chest pressure, or leg swelling.  He is not having any major problems with bleeding or bruising.   Current Medications: Current Meds  Medication Sig   albuterol  (VENTOLIN  HFA) 108 (90 Base) MCG/ACT inhaler Inhale 2 puffs into the lungs every 4 (four) hours as needed for wheezing or shortness of breath.   apixaban  (ELIQUIS ) 5 MG TABS tablet Take 1 tablet (5 mg total) by mouth 2 (two) times daily.   atorvastatin  (LIPITOR ) 40 MG tablet Take 1 tablet by mouth once daily   cholecalciferol  (VITAMIN D3) 25 MCG (1000 UNIT) tablet Take 1,000 Units by mouth daily.   Cyanocobalamin  (B-12 PO) Take 1 capsule by mouth daily.   Fluticasone-Umeclidin-Vilant (TRELEGY ELLIPTA) 100-62.5-25 MCG/ACT AEPB Inhale 1 puff into the lungs daily.   metoprolol  succinate (TOPROL -XL) 25 MG 24 hr tablet Take 1/2 (one-half) tablet by mouth once daily (Patient taking differently: Take 12.5 mg by mouth daily.)   senna-docusate (SENNA S) 8.6-50 MG tablet Take 1-2 tablets by mouth See admin instructions. Take 2 tablets in the morning and 2 tablet at night   traMADol  (ULTRAM ) 50 MG tablet Take 1 tablet (50 mg total) by mouth every 6 (six) hours as needed.     Allergies:   Tiotropium bromide monohydrate   ROS:   Please see the history of present illness.    All other systems reviewed and are negative.  EKGs/Labs/Other Studies Reviewed:    The following studies were reviewed today: Cardiac Studies & Procedures   ______________________________________________________________________________________________ CARDIAC CATHETERIZATION  CARDIAC CATHETERIZATION 04/09/2022  Conclusion 1.  Mild obstructive coronary artery disease. 2.  Severely elevated LVEDP of 29 mmHg. 3.  Heavily calcified aortic valve with a mean gradient of 52 mmHg and a peak to peak gradient of 62 mmHg consistent with severe aortic stenosis; aortic waveforms demonstrated parvus and tardus. 4.  Preserved left ventricular ejection fraction.  Recommendation: Echocardiogram to evaluate for severe aortic stenosis which will inform therapy moving forward.  Findings Coronary Findings Diagnostic  Dominance: Right  Left Anterior Descending There is mild diffuse disease throughout the vessel.  Right Coronary Artery There is moderate diffuse disease throughout the vessel.  Intervention  No interventions have been documented.     ECHOCARDIOGRAM  ECHOCARDIOGRAM COMPLETE 03/16/2024  Narrative ECHOCARDIOGRAM REPORT    Patient Name:    Bob Ross     Date of Exam: 03/16/2024 Medical Rec #:  [id number]     Height:       71.0 in Accession #:    [id number]    Weight:       211.5 lb Date of Birth:  1966/01/30      BSA:          2.159 m Patient Age:    57 years      BP:           135/90 mmHg Patient Gender: M             HR:           55 bpm. Exam Location:  [address]  Procedure: 2D Echo, Cardiac Doppler, Color Doppler and 3D Echo (Both Spectral and Color Flow Doppler were utilized during procedure).  Indications:    S/P TAVR (transcatheter aortic valve replacement) [Z95.2] Follow up gradienys after restarting higher dose eliquis .  History:        Patient has prior history of Echocardiogram examinations, most recent 11/16/2023. CAD; Risk Factors:Hypertension and Dyslipidemia. Aortic Valve: 26 mm Edwards Sapien prosthetic, stented (TAVR) valve is present in the aortic position. Procedure Date: 04/17/2022.  Sonographer:    Augustin Seals RDCS Referring Phys: [id number] KATHRYN R THOMPSON  IMPRESSIONS   1. Left ventricular ejection fraction, by estimation, is 55 to 60%. The left ventricle has normal function. The left ventricle has no regional wall motion abnormalities. Left ventricular diastolic parameters were normal. 2. Right ventricular systolic function is normal. The right ventricular size is normal. 3. Left atrial size was mildly dilated. 4. The mitral valve is normal in structure. No evidence of mitral valve regurgitation. No evidence of mitral stenosis. 5. Post TAVR with 26 mm Sapien valve no PVL mean gradient 18 peak 34 mmHg AVA 2.1 cm2 gradients lower than seen on TTE 11/16/23 mean 25->21 mmHg peak 39.9->32.5 mmHg . The aortic valve has been repaired/replaced. Aortic valve regurgitation is not visualized. No aortic stenosis is present. There is a 26 mm Edwards Sapien prosthetic (TAVR) valve present in the aortic position. Procedure Date: 04/17/2022. 6. Aortic dilatation noted. There is mild dilatation of the  ascending aorta, measuring 38 mm. 7. The inferior vena cava is normal in size with greater than 50% respiratory variability, suggesting right atrial pressure of 3 mmHg.  FINDINGS Left Ventricle: Left ventricular ejection fraction, by estimation, is 55 to 60%. The left ventricle has normal function. The left ventricle has no regional wall motion abnormalities. Strain was performed and the global longitudinal strain is indeterminate. The left ventricular internal cavity size was normal in size. There is no left ventricular hypertrophy. Left ventricular diastolic parameters were normal.  Right Ventricle: The right ventricular size is normal. No increase in right ventricular wall thickness. Right ventricular systolic function is normal.  Left Atrium: Left atrial size was mildly dilated.  Right Atrium: Right atrial size was normal in size.  Pericardium: There is no evidence of pericardial effusion.  Mitral Valve: The mitral valve is normal in structure. No evidence of mitral valve regurgitation. No evidence of mitral valve stenosis.  Tricuspid Valve: The tricuspid valve is  normal in structure. Tricuspid valve regurgitation is not demonstrated. No evidence of tricuspid stenosis.  Aortic Valve: Post TAVR with 26 mm Sapien valve no PVL mean gradient 18 peak 34 mmHg AVA 2.1 cm2 gradients lower than seen on TTE 11/16/23 mean 25->21 mmHg peak 39.9->32.5 mmHg. The aortic valve has been repaired/replaced. Aortic valve regurgitation is not visualized. No aortic stenosis is present. Aortic valve mean gradient measures 21.0 mmHg. Aortic valve peak gradient measures 32.5 mmHg. Aortic valve area, by VTI measures 1.99 cm. There is a 26 mm Edwards Sapien prosthetic, stented (TAVR) valve present in the aortic position. Procedure Date: 04/17/2022.  Pulmonic Valve: The pulmonic valve was normal in structure. Pulmonic valve regurgitation is not visualized. No evidence of pulmonic stenosis.  Aorta: Aortic dilatation  noted. There is mild dilatation of the ascending aorta, measuring 38 mm.  Venous: The inferior vena cava is normal in size with greater than 50% respiratory variability, suggesting right atrial pressure of 3 mmHg.  IAS/Shunts: No atrial level shunt detected by color flow Doppler.  Additional Comments: 3D was performed not requiring image post processing on an independent workstation and was normal.   LEFT VENTRICLE PLAX 2D LVIDd:         5.50 cm   Diastology LVIDs:         3.83 cm   LV e' medial:    6.40 cm/s LV PW:         1.00 cm   LV E/e' medial:  11.7 LV IVS:        1.00 cm   LV e' lateral:   11.80 cm/s LVOT diam:     2.30 cm   LV E/e' lateral: 6.4 LV SV:         137 LV SV Index:   63 LVOT Area:     4.15 cm  3D Volume EF: 3D EF:        58 % LV EDV:       139 ml LV ESV:       58 ml LV SV:        81 ml  RIGHT VENTRICLE             IVC RV Basal diam:  4.10 cm     IVC diam: 1.20 cm RV Mid diam:    3.30 cm RV S prime:     11.60 cm/s TAPSE (M-mode): 2.4 cm  LEFT ATRIUM             Index        RIGHT ATRIUM           Index LA diam:        3.70 cm 1.71 cm/m   RA Area:     13.20 cm LA Vol (A2C):   44.3 ml 20.52 ml/m  RA Volume:   30.40 ml  14.08 ml/m LA Vol (A4C):   17.2 ml 7.97 ml/m LA Biplane Vol: 28.7 ml 13.29 ml/m AORTIC VALVE AV Area (Vmax):    2.16 cm AV Area (Vmean):   1.91 cm AV Area (VTI):     1.99 cm AV Vmax:           285.20 cm/s AV Vmean:          216.000 cm/s AV VTI:            0.690 m AV Peak Grad:      32.5 mmHg AV Mean Grad:      21.0 mmHg LVOT Vmax:  148.00 cm/s LVOT Vmean:        99.100 cm/s LVOT VTI:          0.330 m LVOT/AV VTI ratio: 0.48  AORTA Ao Root diam: 3.10 cm Ao Asc diam:  3.80 cm  MITRAL VALVE MV Area (PHT): 3.65 cm    SHUNTS MV Decel Time: 208 msec    Systemic VTI:  0.33 m MV E velocity: 75.10 cm/s  Systemic Diam: 2.30 cm MV A velocity: 72.10 cm/s MV E/A ratio:  1.04  Maude Emmer MD Electronically signed by  Maude Emmer MD Signature Date/Time: 03/16/2024/10:25:35 AM    Final      CT SCANS  CT CORONARY MORPH W/CTA COR W/SCORE 10/01/2022  Addendum 10/02/2022  9:13 PM ADDENDUM REPORT: 10/02/2022 21:10  EXAM: OVER-READ INTERPRETATION  CT CHEST  The following report is an over-read performed by radiologist Dr. Franky Leff [facility] on 10/02/2022. This over-read does not include interpretation of cardiac or coronary anatomy or pathology. The coronary CTA interpretation by the cardiologist is attached.  COMPARISON:  06/02/2022  FINDINGS: Heart is normal size. Aorta normal caliber. Scattered calcifications. Prior TAVR. Calcified subcarinal and right paratracheal lymph nodes as well as right hilar lymph nodes. Linear scarring in the lung bases. No acute confluent opacity or effusion. No acute findings in the upper abdomen. Chest wall soft tissues are unremarkable. No acute bony abnormality.  IMPRESSION: No acute extra cardiac abnormality.  Old granulomatous disease.  Scattered aortic atherosclerosis.   Electronically Signed By: Franky Crease M.D. On: 10/02/2022 21:10  Narrative CLINICAL DATA:  Post TAVR  EXAM: Cardiac CTA  TECHNIQUE: Gated cardiac CTA performed with no beta blocker or nitro for post TAVR valve evaluation Retrospective exam done to evaluate leaflet motion  Comparison CT done 06/02/22 Patient received 80 cc contrast Scanned at 120 kV with average HR of 58 bpm  FINDINGS: Calcium  Score: LM/3 vessel involvement  LM 67.6  LAD 345  LCX 85.7  RCA 412  Total 910  Aorta: No aneurysm. Ascending thoracic aorta 3.4 cm Mild calcific atherosclerosis Arch vessels not visualized  The is a 26 mm Sapien 3 stented TAVR valve in good position. No peri valvular leak Compared to CTA done 06/02/22 leaflet motion is improved. The previously described severe HALT/HAM is also  Much improved which corresponds to improved gradients on recent echo.  There is still some hypo-attenuation that the base of each leaflet commissure  IMPRESSION: 1. LM/ 3 vessel calcium  with score 910 which is 99 th percentile for age/sex  2.  Normal ascending thoracic aorta 3.4 cm  3. Marked improvement in HALT/HAM on valve leaflets with improved leaflet motion Still with mild hypo attenuation at base of each commissure Consider 3 more months of anticoagulation and then  Follow gradients by echo and only repeat CTA if gradients go back up after stopping DOAC  Maude Emmer  Electronically Signed: By: Maude Emmer M.D. On: 10/01/2022 10:30   CT SCANS  CT CORONARY MORPH W/CTA COR W/SCORE 06/02/2022  Addendum 06/02/2022  3:20 PM ADDENDUM REPORT: 06/02/2022 15:18  CLINICAL DATA:  S/p TAVR. Increased gradients across the prosthesis. 26 mm S3 04/17/2022.  EXAM: Cardiac TAVR CT  TECHNIQUE: A non-contrast, gated CT scan was obtained with axial slices of 3 mm through the heart for aortic valve calcium  scoring. A 120 kV retrospective, gated, contrast cardiac scan was obtained. Gantry rotation speed was 250 msecs and collimation was 0.6 mm. Nitroglycerin  was not given. The 3D data set was  reconstructed in 5% intervals of the 0-95% of the R-R cycle. Systolic and diastolic phases were analyzed on a dedicated workstation using MPR, MIP, and VRT modes. The patient received 100 cc of contrast.  FINDINGS: Image quality: Excellent.  Noise artifact is: Limited.  Aortic valve prosthesis: A 26 mm S3 TAVR is present. There is severe HALT with HAM of the leaflets of the NCC/LCC. The HALT covers all of these leaflets. There is HALT at the base of the leaflet of the RCC. There is no evidence of paravalvular leak. This is consistent with leaflet thrombosis.  Coronary Arteries: Normal coronary origin. Right dominance. The study was performed without use of NTG and is insufficient for plaque evaluation. Please refer to recent cardiac catheterization for  coronary assessment. 3-vessel coronary calcifications noted.  Cardiac Morphology:  Right Atrium: Right atrial size is within normal limits.  Right Ventricle: The right ventricular cavity is within normal limits.  Left Atrium: Left atrial size is normal in size with no left atrial appendage filling defect.  Left Ventricle: The ventricular cavity size is within normal limits.  Pulmonary arteries: Normal in size.  Pulmonary veins: Normal pulmonary venous drainage.  Pericardium: Normal thickness with no significant effusion or calcium  present.  Mitral Valve: The mitral valve is normal structure without significant calcification.  Aorta: Normal caliber.  Extra-cardiac findings: See attached radiology report for non-cardiac structures.  IMPRESSION: 1. 26 mm S3 with severe HALT/HAM consistent with leaflet thrombosis.  2. 3-vessel coronary calcifications.  Darryle T. Barbaraann, MD   Electronically Signed By: Darryle Barbaraann M.D. On: 06/02/2022 15:18  Narrative EXAM: OVER-READ INTERPRETATION  CT CHEST  The following report is a limited chest CT over-read performed by radiologist Dr. Rea Marc of [facility] on 06/02/2022. This over-read does not include interpretation of cardiac or coronary anatomy or pathology. The coronary CTA interpretation by the cardiologist is attached.  COMPARISON:  Cardiac CT dated April 10, 2022  FINDINGS: Vascular: Acute pulmonary embolus of the distal right pulmonary artery with extension into the right upper lobe lobar artery, finding is new when compared with prior heart CT dated April 10, 2022. No CT evidence of right heart strain (RV/LV ratio of 0.7). Interval transcatheter aortic valve replacement. Normal caliber thoracic aorta with mild atherosclerotic disease.  Mediastinum/Nodes: Esophagus is unremarkable. No pathologically enlarged lymph nodes seen in the visualized chest. Calcified mediastinal lymph nodes,  likely sequela of prior granulomatous infection  Lungs/Pleura: Trace right pleural effusion and right basilar atelectasis. Linear opacities of the lower right lung, likely due to scarring or atelectasis. Calcified granulomas of the right lower lobe.  Upper Abdomen: No acute abnormality.  Musculoskeletal: No chest wall mass or suspicious bone lesions identified.  IMPRESSION: 1. Acute pulmonary embolus of the distal right pulmonary artery with extension into the right upper lobe lobar artery. No CT evidence of right heart strain. 2. Trace right pleural effusion and right basilar atelectasis. 3. Aortic Atherosclerosis (ICD10-I70.0).  Critical Value/emergent results were called by telephone at the time of interpretation on 06/02/2022 at 1:11 pm to provider Kate Minus, NP, who verbally acknowledged these results.  Electronically Signed: By: Rea Marc M.D. On: 06/02/2022 13:14   CT SCANS  CT CORONARY MORPH W/CTA COR W/SCORE 04/10/2022  Addendum 04/10/2022 10:12 PM ADDENDUM REPORT: 04/10/2022 22:10  CLINICAL DATA:  83 -year-old male with severe aortic stenosis being evaluated for a TAVR procedure.  EXAM: Cardiac TAVR CT  TECHNIQUE: The patient was scanned on a [employer]. A 120  kV retrospective scan was triggered in the descending thoracic aorta at 111 HU's. Gantry rotation speed was 250 msecs and collimation was .6 mm. No beta blockade or nitro were given. The 3D data set was reconstructed in 5% intervals of the R-R cycle. Systolic and diastolic phases were analyzed on a dedicated work station using MPR, MIP and VRT modes. The patient received 100 cc of contrast.  FINDINGS: Aortic Root:  Aortic valve: Bicuspid aortic valve with fusion of right and noncoronary cusps  Aortic valve calcium  score: 4864  Aortic annulus:  Diameter: 28mm x 28mm  Perimeter: 86mm  Area: 579 mm^2  Calcifications: Moderate calcification adjacent to right  coronary cusp  Coronary height: Min Left - 14mm, Max Left - 21mm; Min Right - 14mm  Sinotubular height: Left cusp - 25mm; Right cusp - 20mm; Noncoronary cusp - 22mm  LVOT (as measured 3 mm below the annulus):  Diameter: 30mm x 26mm  Area: 614mm^2  Calcifications: Moderate calcification inferior to RCC  Aortic sinus width: Left cusp - 35mm; Right cusp - 31mm; Noncoronary cusp - 31mm  Sinotubular junction width: 31mm x 29mm  Optimum Fluoroscopic Angle for Delivery: RAO 3 CRA 9  Cardiac:  Right atrium: Normal size  Right ventricle: Normal size  Pulmonary arteries: Normal size  Pulmonary veins: Normal configuration  Left atrium: Mild enlargement  Left ventricle: Normal size  Pericardium: Normal thickness  Coronary arteries: Coronary calcium  score 757 (98th percentile)  IMPRESSION: 1. Bicuspid aortic valve with fusion of right and noncoronary cusps. Severe calcifications (AV calcium  score 4864)  2. Aortic annulus measures 28mm x 28mm with perimeter 86mm and area 579 mm^2. Moderate annular calcifications adjacent to right coronary cusp and extending into LVOT. Annular measurements are suitable for delivery of 29mm Edwards Sapien 3 valve  3. Sufficient coronary to annulus distance, measuring 14mm to left main and 14mm to RCA  4. Optimum Fluoroscopic Angle for Delivery:  RAO 3 CRA 9  5. Coronary calcium  score 757 (98th percentile)   Electronically Signed By: Lonni Nanas M.D. On: 04/10/2022 22:10  Narrative EXAM: OVER-READ INTERPRETATION  CT CHEST  The following report is a limited chest CT over-read performed by radiologist Dr. Rea Marc of [facility] on 04/10/2022. This over-read does not include interpretation of cardiac or coronary anatomy or pathology. The cardiac TAVR interpretation by the cardiologist is attached.  FINDINGS: Extracardiac findings will be described separately under dictation for contemporaneously  obtained CTA chest, abdomen and pelvis.  IMPRESSION: Please see separate dictation for contemporaneously obtained CTA chest, abdomen and pelvis dated 04/10/2022 for full description of relevant extracardiac findings.  Electronically Signed: By: Rea Marc M.D. On: 04/10/2022 15:48     ______________________________________________________________________________________________      EKG:   EKG Interpretation Date/Time:  Wednesday March 16 2024 10:50:36 EDT Ventricular Rate:  70 PR Interval:  178 QRS Duration:  108 QT Interval:  416 QTC Calculation: 449 R Axis:   -44  Text Interpretation: Sinus rhythm with marked sinus arrhythmia Left axis deviation ST & T wave abnormality, consider inferolateral ischemia When compared with ECG of 18-Apr-2022 05:50, Incomplete right bundle branch block is no longer Present Confirmed by Wonda Sharper [phone]) on 03/16/2024 11:00:02 AM    Recent Labs: 12/09/2023: ALT 32; BUN 10; Creatinine 1.25; Hemoglobin 17.7; Platelet Count 65; Potassium 4.4; Sodium 142  Recent Lipid Panel    Component Value Date/Time   CHOL 126 04/10/2022 0142   TRIG 309 (H) 04/10/2022 0142   HDL 36 (L)  04/10/2022 0142   CHOLHDL 3.5 04/10/2022 0142   VLDL 62 (H) 04/10/2022 0142   LDLCALC 28 04/10/2022 0142     Risk Assessment/Calculations:                Physical Exam:    VS:  BP 132/86   Pulse 70   Ht 5' 11 (1.803 m)   SpO2 93%   BMI 29.50 kg/m     Wt Readings from Last 3 Encounters:  12/09/23 211 lb 8 oz (95.9 kg)  11/16/23 210 lb 3.2 oz (95.3 kg)  05/28/23 210 lb 3.2 oz (95.3 kg)     GEN:  Well nourished, well developed in no acute distress HEENT: Normal except for poor dentition NECK: No JVD; No carotid bruits LYMPHATICS: No lymphadenopathy CARDIAC: RRR, 2/6 systolic murmur at the right upper sternal border RESPIRATORY:  Clear to auscultation without rales, wheezing or rhonchi  ABDOMEN: Soft, non-tender, non-distended MUSCULOSKELETAL:   No edema; No deformity  SKIN: Warm and dry NEUROLOGIC:  Alert and oriented x 3 PSYCHIATRIC:  Normal affect   Assessment & Plan S/P TAVR (transcatheter aortic valve replacement) Patient with NYHA functional class II limitation from exertional dyspnea, likely related primarily to his heavy tobacco use.  Today's echo is reviewed with an LVEF of 55 to 60%.  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  These numbers are improved from previous.  There is no aortic regurgitation.  I recommended that the patient continue on apixaban  5 mg twice daily.  I like to see him back in 1 year for follow-up evaluation with an echocardiogram before his visit. Essential hypertension Blood pressure is controlled on amlodipine  and metoprolol  succinate.  Will continue the same. Tobacco abuse Cessation counseling done.  We discussed nicotine  replacement.  Patient has had limited success with this in the past.  He is going to try hypnotism.  I emphasized the importance of complete tobacco cessation with him.  Overall, I think Mr. Brandow will need to remain on oral anticoagulation.  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  His TAVR valve function is normal on therapeutic anticoagulation and this will be continued.            Medication Adjustments/Labs and Tests Ordered: Current medicines are reviewed at length with the patient today.  Concerns regarding medicines are outlined above.  Orders Placed This Encounter  Procedures   EKG 12-Lead   No orders of the defined types were placed in this encounter.   Patient Instructions  Medication Instructions:  NO Changes *If you need a refill on your cardiac medications before your next appointment,  please call your pharmacy*  Lab Work: None If you have labs (blood work) drawn today and your tests are completely normal, you will receive your results only by: MyChart Message (if you have MyChart) OR A paper copy in the mail If you have any lab test that is abnormal or we need to change your treatment, we will call you to review the results.  Testing/Procedures: Echo before 1 year follow-up.  Follow-Up: At [facility], you and your health needs are our priority.  As part of our continuing mission to provide you with exceptional heart care, our providers are all part of one  team.  This team includes your primary Cardiologist (physician) and Advanced Practice Providers or APPs (Physician Assistants and Nurse Practitioners) who all work together to provide you with the care you need, when you need it.  Your next appointment:   1 year(s)  Provider:   Ozell Fell, MD    We recommend signing up for the patient portal called MyChart.  Sign up information is provided on this After Visit Summary.  MyChart is used to connect with patients for Virtual Visits (Telemedicine).  Patients are able to view lab/test results, encounter notes, upcoming appointments, etc.  Non-urgent messages can be sent to your provider as well.   To learn more about what you can do with MyChart, go to [email or web address].   Other Instructions None       Signed, Ozell Fell, MD  03/16/2024 11:14 AM    [facility]

## 2024-03-16 NOTE — Assessment & Plan Note (Signed)
 Patient with NYHA functional class II limitation from exertional dyspnea, likely related primarily to his heavy tobacco use.  Today's echo is reviewed with an LVEF of 55 to 60%.  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  These numbers are improved from previous.  There is no aortic regurgitation.  I recommended that the patient continue on apixaban  5 mg twice daily.  I like to see him back in 1 year for follow-up evaluation with an echocardiogram before his visit.

## 2024-03-17 DIAGNOSIS — Z888 Allergy status to other drugs, medicaments and biological substances status: Secondary | ICD-10-CM | POA: Diagnosis not present

## 2024-03-17 DIAGNOSIS — Z7901 Long term (current) use of anticoagulants: Secondary | ICD-10-CM | POA: Diagnosis not present

## 2024-03-17 DIAGNOSIS — Z860100 Personal history of colon polyps, unspecified: Secondary | ICD-10-CM | POA: Diagnosis not present

## 2024-03-17 DIAGNOSIS — J449 Chronic obstructive pulmonary disease, unspecified: Secondary | ICD-10-CM | POA: Diagnosis not present

## 2024-03-17 DIAGNOSIS — I10 Essential (primary) hypertension: Secondary | ICD-10-CM | POA: Diagnosis not present

## 2024-03-17 DIAGNOSIS — Z85038 Personal history of other malignant neoplasm of large intestine: Secondary | ICD-10-CM | POA: Diagnosis not present

## 2024-03-17 DIAGNOSIS — E785 Hyperlipidemia, unspecified: Secondary | ICD-10-CM | POA: Diagnosis not present

## 2024-03-17 DIAGNOSIS — Z1211 Encounter for screening for malignant neoplasm of colon: Secondary | ICD-10-CM | POA: Diagnosis not present

## 2024-03-17 DIAGNOSIS — Z7982 Long term (current) use of aspirin: Secondary | ICD-10-CM | POA: Diagnosis not present

## 2024-03-17 DIAGNOSIS — I2510 Atherosclerotic heart disease of native coronary artery without angina pectoris: Secondary | ICD-10-CM | POA: Diagnosis not present

## 2024-03-17 DIAGNOSIS — K635 Polyp of colon: Secondary | ICD-10-CM | POA: Diagnosis not present

## 2024-04-15 ENCOUNTER — Encounter: Payer: Self-pay | Admitting: Radiology

## 2024-04-21 ENCOUNTER — Other Ambulatory Visit: Payer: Self-pay

## 2024-04-21 ENCOUNTER — Emergency Department (HOSPITAL_COMMUNITY)

## 2024-04-21 ENCOUNTER — Encounter (HOSPITAL_COMMUNITY): Payer: Self-pay | Admitting: Emergency Medicine

## 2024-04-21 ENCOUNTER — Emergency Department (HOSPITAL_COMMUNITY)
Admission: EM | Admit: 2024-04-21 | Discharge: 2024-04-21 | Disposition: A | Discharge status: Home / Self Care | Attending: Emergency Medicine | Admitting: Emergency Medicine

## 2024-04-21 DIAGNOSIS — M25461 Effusion, right knee: Secondary | ICD-10-CM | POA: Insufficient documentation

## 2024-04-21 DIAGNOSIS — M25561 Pain in right knee: Secondary | ICD-10-CM | POA: Diagnosis present

## 2024-04-21 DIAGNOSIS — I2510 Atherosclerotic heart disease of native coronary artery without angina pectoris: Secondary | ICD-10-CM | POA: Diagnosis not present

## 2024-04-21 DIAGNOSIS — Z7951 Long term (current) use of inhaled steroids: Secondary | ICD-10-CM | POA: Diagnosis not present

## 2024-04-21 DIAGNOSIS — Z7901 Long term (current) use of anticoagulants: Secondary | ICD-10-CM | POA: Diagnosis not present

## 2024-04-21 DIAGNOSIS — J449 Chronic obstructive pulmonary disease, unspecified: Secondary | ICD-10-CM | POA: Diagnosis not present

## 2024-04-21 LAB — CBC WITH DIFFERENTIAL/PLATELET
Basophils Absolute: 0 K/uL (ref 0.0–0.1)
Basophils Relative: 0 %
Eosinophils Absolute: 0.1 K/uL (ref 0.0–0.5)
Eosinophils Relative: 1 %
HCT: 47.5 % (ref 39.0–52.0)
Hemoglobin: 15.9 g/dL (ref 13.0–17.0)
Lymphocytes Relative: 28 %
Lymphs Abs: 2.5 K/uL (ref 0.7–4.0)
MCH: 30.8 pg (ref 26.0–34.0)
MCHC: 33.5 g/dL (ref 30.0–36.0)
MCV: 92.1 fL (ref 80.0–100.0)
Monocytes Absolute: 0.8 K/uL (ref 0.1–1.0)
Monocytes Relative: 9 %
Neutro Abs: 5.6 K/uL (ref 1.7–7.7)
Neutrophils Relative %: 62 %
Platelets: 74 K/uL — ABNORMAL LOW (ref 150–400)
RBC: 5.16 MIL/uL (ref 4.22–5.81)
RDW: 13.7 % (ref 11.5–15.5)
Smear Review: DECREASED
WBC: 9 K/uL (ref 4.0–10.5)
nRBC: 0 % (ref 0.0–0.2)

## 2024-04-21 LAB — GRAM STAIN: Gram Stain: NONE SEEN

## 2024-04-21 LAB — BASIC METABOLIC PANEL WITH GFR
Anion gap: 14 (ref 5–15)
BUN: 14 mg/dL (ref 6–20)
CO2: 18 mmol/L — ABNORMAL LOW (ref 22–32)
Calcium: 8.4 mg/dL — ABNORMAL LOW (ref 8.9–10.3)
Chloride: 102 mmol/L (ref 98–111)
Creatinine, Ser: 0.96 mg/dL (ref 0.61–1.24)
GFR, Estimated: >60 mL/min (ref >60)
Glucose, Bld: 96 mg/dL (ref 70–99)
Potassium: 4.1 mmol/L (ref 3.5–5.1)
Sodium: 134 mmol/L — ABNORMAL LOW (ref 135–145)

## 2024-04-21 LAB — SYNOVIAL CELL COUNT + DIFF, W/ CRYSTALS
Crystals, Fluid: NONE SEEN
Eosinophils-Synovial: 0 % (ref 0–1)
Lymphocytes-Synovial Fld: 1 % (ref 0–20)
Monocyte-Macrophage-Synovial Fluid: 4 % — ABNORMAL LOW (ref 50–90)
Neutrophil, Synovial: 95 % — ABNORMAL HIGH (ref 0–25)
WBC, Synovial: 3192 /mm3 — ABNORMAL HIGH (ref 0–200)

## 2024-04-21 LAB — PROTEIN, PLEURAL OR PERITONEAL FLUID: Total protein, fluid: 4 g/dL

## 2024-04-21 LAB — GLUCOSE, PLEURAL OR PERITONEAL FLUID: Glucose, Fluid: 72 mg/dL

## 2024-04-21 MED ORDER — OXYCODONE-ACETAMINOPHEN 5-325 MG PO TABS: 1.0000 | ORAL_TABLET | Freq: Four times a day (QID) | ORAL | 0 refills | Status: DC | PRN

## 2024-04-21 MED ORDER — ONDANSETRON HCL 4 MG/2ML IJ SOLN
4.0000 mg | Freq: Once | INTRAMUSCULAR | Status: AC
  Administered 2024-04-21: 4 mg via INTRAVENOUS
  Filled 2024-04-21: qty 2

## 2024-04-21 MED ORDER — MORPHINE SULFATE (PF) 4 MG/ML IV SOLN
4.0000 mg | Freq: Once | INTRAVENOUS | Status: AC
  Administered 2024-04-21: 4 mg via INTRAVENOUS
  Filled 2024-04-21: qty 1

## 2024-04-21 MED ORDER — OXYCODONE-ACETAMINOPHEN 5-325 MG PO TABS
1.0000 | ORAL_TABLET | Freq: Once | ORAL | Status: AC
  Administered 2024-04-21: 1 via ORAL
  Filled 2024-04-21: qty 1

## 2024-04-21 MED ORDER — LIDOCAINE HCL (PF) 1 % IJ SOLN
10.0000 mL | Freq: Once | INTRAMUSCULAR | Status: AC
  Administered 2024-04-21: 10 mL
  Filled 2024-04-21: qty 10

## 2024-04-21 MED ORDER — HYDROMORPHONE HCL 1 MG/ML IJ SOLN
1.0000 mg | Freq: Once | INTRAMUSCULAR | Status: AC
  Administered 2024-04-21: 1 mg via INTRAVENOUS
  Filled 2024-04-21: qty 1

## 2024-04-21 NOTE — ED Notes (Signed)
 Immediately after pushing zofran  and dilaudid , pt arm from site down to wrist and a little above iv site. Pt denies any other symptoms. PA aware.

## 2024-04-21 NOTE — Discharge Instructions (Addendum)
 You may apply ice packs on and off to your knee.  Call your orthopedic provider tomorrow to arrange follow-up appointment.  Return to the emergency department for any new or worsening symptoms.

## 2024-04-21 NOTE — ED Provider Notes (Signed)
 [name] Provider Note   CSN: [id number] Arrival date & time: 04/21/24  1130     Patient presents with: Knee Pain   Bob Ross is a 58 y.o. male.    Knee Pain Associated symptoms: no fever        Bob Ross is a 58 y.o. male with past medical history significant for coronary artery disease, NSTEMI, aortic stenosis with TAVR 2023, COPD who presents to the Emergency Department complaining of pain and swelling of his right knee x 1 week.  He states pain began gradually but worsened last evening.  He has noticed swelling of his knee today.  He is unable to fully extend the right leg due to knee pain.  Unable to bear weight.  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  He denies any fever or chills nausea vomiting numbness or weakness of the extremity also denies any recent dental work or procedures.  He is anticoagulated on Eliquis   Prior to Admission medications   Medication Sig Start Date End Date Taking? Authorizing Provider  albuterol  (VENTOLIN  HFA) 108 (90 Base) MCG/ACT inhaler Inhale 2 puffs into the lungs every 4 (four) hours as needed for wheezing or shortness of breath. 03/29/21   Cleotilde Rogue, MD  amLODipine  (NORVASC ) 5 MG tablet Take 1 tablet by mouth once daily 12/17/23   Wonda Sharper, MD  apixaban  (ELIQUIS ) 5 MG TABS tablet Take 1 tablet (5 mg total) by mouth 2 (two) times daily. 11/16/23   Sebastian Lamarr SAUNDERS, PA-C  atorvastatin  (LIPITOR ) 40 MG tablet Take 1 tablet by mouth once daily 05/15/23   Sebastian Lamarr SAUNDERS, PA-C  cholecalciferol (VITAMIN D3) 25 MCG (1000 UNIT) tablet Take 1,000 Units by mouth daily.    [provider]  Cyanocobalamin  (B-12 PO) Take 1 capsule by mouth daily.    [provider]  Fluticasone-Umeclidin-Vilant (TRELEGY ELLIPTA) 100-62.5-25 MCG/ACT AEPB Inhale 1 puff into the lungs daily.    [provider]   icosapent  Ethyl (VASCEPA ) 1 g capsule Take 2 capsules (2 g total) by mouth 2 (two) times daily. Patient not taking: Reported on 03/16/2024 03/02/23   Wonda Sharper, MD  metoprolol  succinate (TOPROL -XL) 25 MG 24 hr tablet Take 1/2 (one-half) tablet by mouth once daily Patient taking differently: Take 12.5 mg by mouth daily. 08/11/23   Wonda Sharper, MD  senna-docusate (SENNA S) 8.6-50 MG tablet Take 1-2 tablets by mouth See admin instructions. Take 2 tablets in the morning and 2 tablet at night    [provider]  traMADol  (ULTRAM ) 50 MG tablet Take 1 tablet (50 mg total) by mouth every 6 (six) hours as needed. 11/19/21   Christopher Savannah, PA-C    Allergies: Tiotropium bromide monohydrate    Review of Systems  Constitutional:  Negative for chills and fever.  Respiratory:  Negative for shortness of breath.   Cardiovascular:  Negative for palpitations.  Gastrointestinal:  Negative for nausea and vomiting.  Musculoskeletal:  Positive for arthralgias (right knee pain and swelling).  Skin:  Negative for color change and rash.  Neurological:  Negative for dizziness, weakness and numbness.    Updated Vital Signs BP 122/82   Pulse 84   Temp 98.2 F (36.8 C) (Oral)   Resp 20   SpO2 94%   Physical Exam Vitals and nursing note reviewed.  Constitutional:      General: He  is not in acute distress.    Appearance: He is not toxic-appearing.     Comments: Pt appears uncomfortable  Cardiovascular:     Rate and Rhythm: Normal rate and regular rhythm.     Pulses: Normal pulses.  Pulmonary:     Effort: Pulmonary effort is normal.  Musculoskeletal:        General: Swelling and tenderness present. No signs of injury.     Right knee: Swelling present. No erythema. Decreased range of motion. Tenderness present.     Comments: Diffuse ttp of the right knee.  Edema and excessive warmth.  No erythema.  Pt unable to extend, holding knee in flexed position.  Skin:    General: Skin is warm.      Capillary Refill: Capillary refill takes less than 2 seconds.     Findings: No erythema.  Neurological:     General: No focal deficit present.     Mental Status: He is alert.     Sensory: No sensory deficit.     Motor: No weakness.     (all labs ordered are listed, but only abnormal results are displayed) Labs Reviewed  CBC WITH DIFFERENTIAL/PLATELET - Abnormal; Notable for the following components:      Result Value   Platelets 74 (*)    All other components within normal limits  BASIC METABOLIC PANEL WITH GFR - Abnormal; Notable for the following components:   Sodium 134 (*)    CO2 18 (*)    Calcium  8.4 (*)    All other components within normal limits  SYNOVIAL CELL COUNT + DIFF, W/ CRYSTALS - Abnormal; Notable for the following components:   Color, Synovial YELLOW (*)    Appearance-Synovial CLOUDY (*)    WBC, Synovial 3,192 (*)    Neutrophil, Synovial 95 (*)    Monocyte-Macrophage-Synovial Fluid 4 (*)    All other components within normal limits  GRAM STAIN  BODY FLUID CULTURE W GRAM STAIN  GLUCOSE, PLEURAL OR PERITONEAL FLUID  URIC ACID, BODY FLUID  PROTEIN, PLEURAL OR PERITONEAL FLUID    EKG: None  Radiology: DG Knee Complete 4 Views Right Result Date: 04/21/2024 CLINICAL DATA:  pain. EXAM: RIGHT KNEE - COMPLETE 4+ VIEW COMPARISON:  03/18/2022. FINDINGS: No acute fracture or dislocation. No aggressive osseous lesion. Redemonstration of peripheral sclerotic lesion in the distal right femur, unchanged since the prior study and favored to represent benign etiology such as bone island. The knee joint appears within normal limits. No significant arthritis. No knee effusion or focal soft tissue swelling. No radiopaque foreign bodies. IMPRESSION: No acute osseous abnormality of the right knee joint. Electronically Signed   By: Ree Molt M.D.   On: 04/21/2024 12:38     .Joint Aspiration/Arthrocentesis  Date/Time: 04/21/2024 5:40 PM  Performed by: Herlinda Milling,  PA-C Authorized by: Herlinda Milling, PA-C   Consent:    Consent obtained:  Verbal   Consent given by:  Patient   Risks, benefits, and alternatives were discussed: yes     Risks discussed:  Bleeding, infection and pain Universal protocol:    Test results available: yes     Imaging studies available: yes     Site/side marked: yes     Immediately prior to procedure, a time out was called: yes     Patient identity confirmed:  Verbally with patient and arm band Location:    Location:  Knee   Knee:  R knee Anesthesia:    Anesthesia method:  Local infiltration  Local anesthetic:  Lidocaine  1% w/o epi Procedure details:    Preparation: Patient was prepped and draped in usual sterile fashion     Needle gauge:  18 G   Ultrasound guidance: no     Approach:  Lateral   Aspirate amount:  40 cc   Aspirate characteristics:  Cloudy and yellow   Steroid injected: no     Specimen collected: yes   Post-procedure details:    Dressing:  Adhesive bandage   Procedure completion:  Tolerated well, no immediate complications    Medications Ordered in the ED  HYDROmorphone  (DILAUDID ) injection 1 mg (1 mg Intravenous Given 04/21/24 1410)  ondansetron  (ZOFRAN ) injection 4 mg (4 mg Intravenous Given 04/21/24 1410)  oxyCODONE -acetaminophen  (PERCOCET/ROXICET) 5-325 MG per tablet 1 tablet (1 tablet Oral Given 04/21/24 1611)  lidocaine  (PF) (XYLOCAINE ) 1 % injection 10 mL (10 mLs Other Given 04/21/24 1741)  morphine  (PF) 4 MG/ML injection 4 mg (4 mg Intravenous Given 04/21/24 1737)                                    Medical Decision Making Patient here with gradually worsening pain and swelling to his right knee.  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   No known injury, fever or chills or pain of his hip or ankle.   I suspect this is a bursitis.  Septic joint also considered although patient denies any recent procedures or recent dental work.  Ligamentous  injury, fracture, dislocation also considered but felt less likely.  Amount and/or Complexity of Data Reviewed Labs: ordered.    Details: Labs unremarkable Radiology: ordered.    Details: XR knee shows no osseous abnormality of the right knee joint Discussion of management or test interpretation with external provider(s): 1420.  I was notified by nursing staff that after medication given IV, he began having localized redness.  IV was placed in right antecubital fossa.  Exam, there was some mild erythema distal to the IV site and at the [facility] area.  No bruising or swelling noted.  Patient denies any itching burning tingling or numbness of his lips face or tongue.  No facial or oral swelling.  On recheck after procedure, patient now able to range of motion the knee and has ambulated in the department, knee pain is much improved.  Low clinical suspicion for septic joint.  Patient also seen by Dr. Charlyn and care plan discussed.  Patient to follow-up with local orthopedics.  Return precautions were discussed.  Risk Prescription drug management.        Final diagnoses:  Effusion of right knee    ED Discharge Orders     None          Herlinda Milling, PA-C 04/23/24 1022    Francesca Elsie CROME, MD 04/23/24 [phone]

## 2024-04-21 NOTE — ED Triage Notes (Addendum)
 Right knee pain x 1 week. States now getting worse. Pt screaming in pain when put on bed. Having to keep bent.  Swelling noted without redness or warmth.

## 2024-04-22 LAB — URIC ACID, BODY FLUID: Uric Acid Body Fluid: 7.2 mg/dL

## 2024-04-22 MED FILL — Oxycodone w/ Acetaminophen Tab 5-325 MG: ORAL | Qty: 6 | Status: AC

## 2024-04-25 LAB — BODY FLUID CULTURE W GRAM STAIN: Culture: NO GROWTH

## 2024-04-27 ENCOUNTER — Encounter: Payer: Self-pay | Admitting: Orthopaedic Surgery

## 2024-04-27 ENCOUNTER — Ambulatory Visit: Admitting: Orthopaedic Surgery

## 2024-04-27 VITALS — BP 121/75 | HR 98 | Ht 71.0 in | Wt 220.0 lb

## 2024-04-27 DIAGNOSIS — M25561 Pain in right knee: Secondary | ICD-10-CM

## 2024-04-27 MED ORDER — OXYCODONE-ACETAMINOPHEN 5-325 MG PO TABS: ORAL_TABLET | ORAL | 0 refills | Status: DC

## 2024-04-27 NOTE — Patient Instructions (Signed)
Out of work until follow up  

## 2024-04-27 NOTE — Progress Notes (Signed)
 My knee hurts bad.  He began having knee pain on the right about a week ago.  Last Thursday he had significant swelling, could not stand on it, could not extend the knee.  He went to the ER on 04-21-24 and was seen.  X-rays were negative.  He had a large effusion. Gram stain negative, cultures of joint fluid negative, no crystals.  He was asked to come here.  He has effusion of the right knee.  He cannot extend past 15 degrees.  He has had some locking.  He has medial pain.  He has crepitus.  He can flex to 90.  He is on crutches.  He has no redness.  I am very concerned about meniscus tear.  I will get MRI of the knee.    He has locking, unable to extend.  Encounter Diagnosis  Name Primary?   Acute pain of right knee Yes   I have reviewed the [place]  Controlled Substance Reporting System web site prior to prescribing narcotic medicine for this patient.  Return in one week.  Get MRI of the right knee.  Call if any problem.  Precautions discussed.  Electronically Signed Lemond Stable, MD 9/3/20252:12 PM

## 2024-04-29 ENCOUNTER — Ambulatory Visit (HOSPITAL_COMMUNITY)
Admission: RE | Admit: 2024-04-29 | Discharge: 2024-04-29 | Disposition: A | Discharge status: Home / Self Care | Source: Ambulatory Visit | Attending: Orthopaedic Surgery | Admitting: Orthopaedic Surgery

## 2024-04-29 DIAGNOSIS — M25561 Pain in right knee: Secondary | ICD-10-CM | POA: Diagnosis present

## 2024-05-02 ENCOUNTER — Other Ambulatory Visit: Payer: Self-pay | Admitting: Physician Assistant

## 2024-05-04 ENCOUNTER — Encounter: Payer: Self-pay | Admitting: Orthopedic Surgery

## 2024-05-04 ENCOUNTER — Encounter: Payer: Self-pay | Admitting: Orthopaedic Surgery

## 2024-05-04 ENCOUNTER — Ambulatory Visit: Admitting: Orthopaedic Surgery

## 2024-05-04 DIAGNOSIS — M25561 Pain in right knee: Secondary | ICD-10-CM | POA: Diagnosis not present

## 2024-05-04 MED ORDER — OXYCODONE-ACETAMINOPHEN 5-325 MG PO TABS: ORAL_TABLET | ORAL | 0 refills | Status: AC

## 2024-05-04 NOTE — Patient Instructions (Signed)
 Schedule appt with Dr. Margrette ASAP  OOW note for another 2 weeks.

## 2024-05-04 NOTE — Progress Notes (Signed)
 My knee is really hurting.  He had MRI of the right knee showing: IMPRESSION: 1. Moderate to large knee joint effusion with suspected synovitis. 2. Moderate sized Baker's cyst likely with some surrounding leakage. 3. Distal patellar tendinopathy. 4. 0.7 cm subcortical cystic lesion along the anteromedial margin of the medial tibial plateau with surrounding marrow edema, possibilities include osteochondral lesion with surrounding marrow edema or degenerative subcortical cystic lesion. This immediately underlies the anterior horn of the medial meniscus. 5. No definite medial meniscal tear is identified to explain the patient's knee locking. The anterior horn is smaller than the posterior horn but subject to limitations of motion artifact, no flap or bucket-handle is observed. 6. Diffuse subcutaneous edema around the knee also infiltrating the popliteal space.   I have explained the findings to him.  I will have him see Dr. Margrette for further followup.  He is using crutch.  He has right medial knee pain.  He has pain putting weight on the leg.  He has slight edema.  NV intact.  ROM is 0 to 95 with pain.    I have independently reviewed the MRI.    Encounter Diagnosis  Name Primary?   Acute pain of right knee Yes   To see Dr. Margrette.  I have reviewed the   Controlled Substance Reporting System web site prior to prescribing narcotic medicine for this patient.  I have informed the patient I will be retiring from medical practice and from this office on May 26, 2024.  The patient has been offered continuing care with Dr. Margrette or Dr. Onesimo of this office.  The patient may choose another provider and the records will be forwarded after proper signature and notification.  Patient understands and agrees.  Call if any problem.  Precautions discussed.  Electronically Signed Lemond Stable, MD 9/10/20251:46 PM

## 2024-05-11 ENCOUNTER — Encounter: Payer: Self-pay | Admitting: Orthopedic Surgery

## 2024-05-11 ENCOUNTER — Ambulatory Visit (HOSPITAL_COMMUNITY)
Admission: RE | Admit: 2024-05-11 | Discharge: 2024-05-11 | Disposition: A | Discharge status: Home / Self Care | Source: Ambulatory Visit | Attending: Orthopedic Surgery | Admitting: Orthopedic Surgery

## 2024-05-11 ENCOUNTER — Ambulatory Visit: Admitting: Orthopedic Surgery

## 2024-05-11 VITALS — BP 121/75 | Ht 71.0 in | Wt 220.0 lb

## 2024-05-11 DIAGNOSIS — Z86711 Personal history of pulmonary embolism: Secondary | ICD-10-CM | POA: Insufficient documentation

## 2024-05-11 DIAGNOSIS — E785 Hyperlipidemia, unspecified: Secondary | ICD-10-CM | POA: Insufficient documentation

## 2024-05-11 DIAGNOSIS — M7989 Other specified soft tissue disorders: Secondary | ICD-10-CM | POA: Diagnosis present

## 2024-05-11 DIAGNOSIS — Z7901 Long term (current) use of anticoagulants: Secondary | ICD-10-CM | POA: Insufficient documentation

## 2024-05-11 DIAGNOSIS — J449 Chronic obstructive pulmonary disease, unspecified: Secondary | ICD-10-CM | POA: Insufficient documentation

## 2024-05-11 DIAGNOSIS — C189 Malignant neoplasm of colon, unspecified: Secondary | ICD-10-CM | POA: Insufficient documentation

## 2024-05-11 DIAGNOSIS — M25561 Pain in right knee: Secondary | ICD-10-CM

## 2024-05-11 DIAGNOSIS — F17200 Nicotine dependence, unspecified, uncomplicated: Secondary | ICD-10-CM | POA: Insufficient documentation

## 2024-05-11 DIAGNOSIS — E559 Vitamin D deficiency, unspecified: Secondary | ICD-10-CM | POA: Insufficient documentation

## 2024-05-11 DIAGNOSIS — M179 Osteoarthritis of knee, unspecified: Secondary | ICD-10-CM | POA: Insufficient documentation

## 2024-05-11 DIAGNOSIS — M8440XA Pathological fracture, unspecified site, initial encounter for fracture: Secondary | ICD-10-CM | POA: Diagnosis not present

## 2024-05-11 DIAGNOSIS — K469 Unspecified abdominal hernia without obstruction or gangrene: Secondary | ICD-10-CM | POA: Insufficient documentation

## 2024-05-11 DIAGNOSIS — K769 Liver disease, unspecified: Secondary | ICD-10-CM | POA: Insufficient documentation

## 2024-05-11 DIAGNOSIS — Z85038 Personal history of other malignant neoplasm of large intestine: Secondary | ICD-10-CM | POA: Insufficient documentation

## 2024-05-11 DIAGNOSIS — I214 Non-ST elevation (NSTEMI) myocardial infarction: Secondary | ICD-10-CM | POA: Insufficient documentation

## 2024-05-11 DIAGNOSIS — C220 Liver cell carcinoma: Secondary | ICD-10-CM | POA: Insufficient documentation

## 2024-05-11 DIAGNOSIS — M5430 Sciatica, unspecified side: Secondary | ICD-10-CM | POA: Insufficient documentation

## 2024-05-11 DIAGNOSIS — Z905 Acquired absence of kidney: Secondary | ICD-10-CM | POA: Insufficient documentation

## 2024-05-11 MED ORDER — OXYCODONE-ACETAMINOPHEN 5-325 MG PO TABS
1.0000 | ORAL_TABLET | Freq: Four times a day (QID) | ORAL | 0 refills | Status: DC | PRN
Start: 2024-05-11 — End: 2024-05-16

## 2024-05-11 NOTE — Progress Notes (Signed)
   BP 121/75 Comment: 04/27/24  Ht 5' 11 (1.803 m)   Wt 220 lb (99.8 kg)   BMI 30.68 kg/m   Body mass index is 30.68 kg/m.  Chief Complaint  Patient presents with   Knee Pain    Right     No diagnosis found.  What pharmacy do you use ? _______[facility] ____________________  DOI/DOS/ Date: 04/18/24  Unchanged Unable to WB has medial knee pain

## 2024-05-11 NOTE — Progress Notes (Signed)
   BP 121/75 Comment: 04/27/24  Ht 5' 11 (1.803 m)   Wt 220 lb (99.8 kg)   BMI 30.68 kg/m   Body mass index is 30.68 kg/m.  Chief Complaint  Patient presents with   Knee Pain    Right     Encounter Diagnoses  Name Primary?   Right leg swelling    Posterior right knee pain    Insufficiency fracture Yes   58 year old male previously seen by Dr. Brenna note he does have some cardiac history  On Eliquis    Presents for evaluation of ongoing pain in his right knee.  Please see Dr. Janae notes for detailed history  Basically he started having pain in his right knee no significant trauma this progressively increased to where he could not bear weight he had a large effusion.  The knee was aspirated large amount of clear yellow fluid.  He did have an x-ray and an MRI it shows an insufficiency fracture of the medial tibial condyle  Evaluation shows a swollen right leg he says his calf and leg been swollen for 3 weeks. He is not able to weight-bear. Knee held in flexion.  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

## 2024-05-11 NOTE — Patient Instructions (Addendum)
 Go today now for US  at [facility] of work until follow up on 06/22/24

## 2024-05-16 ENCOUNTER — Telehealth: Payer: Self-pay | Admitting: Orthopedic Surgery

## 2024-05-16 ENCOUNTER — Other Ambulatory Visit: Payer: Self-pay | Admitting: Orthopedic Surgery

## 2024-05-16 DIAGNOSIS — M8440XA Pathological fracture, unspecified site, initial encounter for fracture: Secondary | ICD-10-CM

## 2024-05-16 MED ORDER — OXYCODONE-ACETAMINOPHEN 5-325 MG PO TABS: 1.0000 | ORAL_TABLET | Freq: Four times a day (QID) | ORAL | 0 refills | Status: DC | PRN

## 2024-05-16 NOTE — Telephone Encounter (Signed)
 Dr. Areatha pt - pt lvm stating he was seen last week and nothing was sent in for him.  He is wanting pain meds sent to [facility].

## 2024-05-18 NOTE — Telephone Encounter (Signed)
 Spoke w/the pt, he is checking on his refill from last week.

## 2024-05-25 ENCOUNTER — Ambulatory Visit: Admitting: Hematology and Oncology

## 2024-05-25 ENCOUNTER — Other Ambulatory Visit

## 2024-06-01 ENCOUNTER — Other Ambulatory Visit: Payer: Self-pay | Admitting: Hematology and Oncology

## 2024-06-01 ENCOUNTER — Inpatient Hospital Stay (HOSPITAL_BASED_OUTPATIENT_CLINIC_OR_DEPARTMENT_OTHER): Admitting: Hematology and Oncology

## 2024-06-01 ENCOUNTER — Inpatient Hospital Stay: Attending: Hematology and Oncology

## 2024-06-01 VITALS — BP 138/86 | HR 87 | Temp 98.0°F | Resp 15 | Wt 211.4 lb

## 2024-06-01 DIAGNOSIS — F17210 Nicotine dependence, cigarettes, uncomplicated: Secondary | ICD-10-CM | POA: Insufficient documentation

## 2024-06-01 DIAGNOSIS — Z809 Family history of malignant neoplasm, unspecified: Secondary | ICD-10-CM | POA: Diagnosis not present

## 2024-06-01 DIAGNOSIS — Z7901 Long term (current) use of anticoagulants: Secondary | ICD-10-CM | POA: Insufficient documentation

## 2024-06-01 DIAGNOSIS — I2699 Other pulmonary embolism without acute cor pulmonale: Secondary | ICD-10-CM

## 2024-06-01 DIAGNOSIS — D696 Thrombocytopenia, unspecified: Secondary | ICD-10-CM | POA: Insufficient documentation

## 2024-06-01 LAB — CBC WITH DIFFERENTIAL (CANCER CENTER ONLY)
Abs Immature Granulocytes: 0.03 K/uL (ref 0.00–0.07)
Basophils Absolute: 0 K/uL (ref 0.0–0.1)
Basophils Relative: 1 %
Eosinophils Absolute: 0.1 K/uL (ref 0.0–0.5)
Eosinophils Relative: 1 %
HCT: 50.5 % (ref 39.0–52.0)
Hemoglobin: 17 g/dL (ref 13.0–17.0)
Immature Granulocytes: 0 %
Lymphocytes Relative: 28 %
Lymphs Abs: 2.4 K/uL (ref 0.7–4.0)
MCH: 29.6 pg (ref 26.0–34.0)
MCHC: 33.7 g/dL (ref 30.0–36.0)
MCV: 88 fL (ref 80.0–100.0)
Monocytes Absolute: 0.6 K/uL (ref 0.1–1.0)
Monocytes Relative: 7 %
Neutro Abs: 5.3 K/uL (ref 1.7–7.7)
Neutrophils Relative %: 63 %
Platelet Count: 126 K/uL — ABNORMAL LOW (ref 150–400)
RBC: 5.74 MIL/uL (ref 4.22–5.81)
RDW: 13.2 % (ref 11.5–15.5)
WBC Count: 8.4 K/uL (ref 4.0–10.5)
nRBC: 0 % (ref 0.0–0.2)

## 2024-06-01 LAB — CMP (CANCER CENTER ONLY)
ALT: 25 U/L (ref 0–44)
AST: 22 U/L (ref 15–41)
Albumin: 3.9 g/dL (ref 3.5–5.0)
Alkaline Phosphatase: 119 U/L (ref 38–126)
Anion gap: 5 (ref 5–15)
BUN: 14 mg/dL (ref 6–20)
CO2: 31 mmol/L (ref 22–32)
Calcium: 10 mg/dL (ref 8.9–10.3)
Chloride: 104 mmol/L (ref 98–111)
Creatinine: 1.22 mg/dL (ref 0.61–1.24)
GFR, Estimated: >60 mL/min (ref >60)
Glucose, Bld: 117 mg/dL — ABNORMAL HIGH (ref 70–99)
Potassium: 4.2 mmol/L (ref 3.5–5.1)
Sodium: 140 mmol/L (ref 135–145)
Total Bilirubin: 0.6 mg/dL (ref 0.0–1.2)
Total Protein: 7.8 g/dL (ref 6.5–8.1)

## 2024-06-01 NOTE — Progress Notes (Signed)
 [facility] [phone]   [phone]  PROGRESS NOTE  Patient Care Team: Gerome Tillman CROME, FNP as PCP - General (Family Medicine) Wonda Sharper, MD as PCP - Cardiology (Cardiology)  Hematological/Oncological History # Acute Pulmonary Embolism 04/17/2022: underwent TAVR after presenting with an NSTEMI and work-up revealed severe aortic stenosis.  06/02/2022: underwent cardiac CT did reveal a leaflet thrombosis along with an acute pulmonary embolus of the distal right pulmonary artery with extension into the right upper lobe lobar artery.  Patient was initiated on Eliquis  therapy  06/25/2022: Establish care with [facility] hemat[facility].  Interval History:  Bob Ross 58 y.o. male with medical history significant for pulmonary embolism/thrombocytopenia who presents for a follow up visit. The patient's last visit was on 12/09/2023. In the interim since the last visit he has continued on Eliquis  therapy.  On exam today Bob Ross reports he has been well overall in interim since her last visit and faithfully taking his Eliquis  therapy.  He notes that he unfortunately did fracture a bone in his leg and developed a Bakers cyst.  He reports that he did not have any swollen redness or bruising at the time he developed this fracture.  He is unsure for how the fracture occurred.  He notes that he is not having any other bleeding with the Eliquis  such as nosebleeds, gum bleeding, bruising, or blood in the urine/stool.  He reports that he pays about $40 per month per the medication.  He reports that he does not have any signs or symptoms concerning for recurrent VTE such as leg pain, leg swelling, chest pain, or shortness of breath.  Overall he feels well and has no questions concerns or complaints today.  Full 10 point ROS otherwise negative.  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 Wonda & Dr. Lucas   Severe aortic stenosis     SURGICAL HISTORY: Past Surgical History:  Procedure Laterality Date   COLON SURGERY     COLOSTOMY     COLOSTOMY REVERSAL     HERNIA REPAIR     INTRAOPERATIVE TRANSTHORACIC ECHOCARDIOGRAM N/A 04/17/2022   Procedure: INTRAOPERATIVE TRANSTHORACIC ECHOCARDIOGRAM;  Surgeon: Wonda Sharper, MD;  Location: [facility] OR;  Service: Open Heart Surgery;  Laterality: N/A;   LEFT HEART CATH AND CORONARY ANGIOGRAPHY N/A 04/09/2022   Procedure: LEFT HEART CATH AND CORONARY ANGIOGRAPHY;  Surgeon: Wendel Lurena POUR, MD;  Location: [facility] INVASIVE CV LAB;  Service: Cardiovascular;  Laterality: N/A;   LIVER RESECTION     TRANSCATHETER AORTIC VALVE REPLACEMENT, TRANSFEMORAL N/A 04/17/2022   Procedure: Transcatheter Aortic Valve Replacement, Transfemoral;  Surgeon: Wonda Sharper, MD;  Location: [facility] OR;  Service: Open Heart Surgery;  Laterality: N/A;    SOCIAL HISTORY: Social History   Socioeconomic History   Marital status: Married    Spouse name: Not on file   Number of children: Not on file   Years of education: Not on file  Highest education level: Not on file  Occupational History   Not on file  Tobacco Use   Smoking status: Every Day    Current packs/day: 1.50    Average packs/day: 1.5 packs/day for 45.0 years (67.5 ttl pk-yrs)    Types: Cigarettes   Smokeless tobacco: Never  Substance and Sexual Activity   Alcohol use: Yes    Comment: Occasional   Drug use: No   Sexual activity: Not on file  Other Topics Concern   Not on file  Social History Narrative    Not on file   Social Drivers of Health   Financial Resource Strain: Not on file  Food Insecurity: Not on file  Transportation Needs: Not on file  Physical Activity: Not on file  Stress: Not on file  Social Connections: Not on file  Intimate Partner Violence: Not on file    FAMILY HISTORY: Family History  Problem Relation Age of Onset   AAA (abdominal aortic aneurysm) Mother    Cancer Father     ALLERGIES:  is allergic to tiotropium bromide monohydrate.  MEDICATIONS:  Current Outpatient Medications  Medication Sig Dispense Refill   albuterol  (VENTOLIN  HFA) 108 (90 Base) MCG/ACT inhaler Inhale 2 puffs into the lungs every 4 (four) hours as needed for wheezing or shortness of breath. 1 each 3   amLODipine  (NORVASC ) 5 MG tablet Take 1 tablet by mouth once daily 90 tablet 3   apixaban  (ELIQUIS ) 5 MG TABS tablet Take 1 tablet (5 mg total) by mouth 2 (two) times daily. 60 tablet 11   atorvastatin  (LIPITOR ) 40 MG tablet Take 1 tablet by mouth once daily 90 tablet 3   cholecalciferol (VITAMIN D3) 25 MCG (1000 UNIT) tablet Take 1,000 Units by mouth daily.     Cyanocobalamin  (B-12 PO) Take 1 capsule by mouth daily.     Fluticasone-Umeclidin-Vilant (TRELEGY ELLIPTA) 100-62.5-25 MCG/ACT AEPB Inhale 1 puff into the lungs daily.     icosapent  Ethyl (VASCEPA ) 1 g capsule Take 2 capsules (2 g total) by mouth 2 (two) times daily. (Patient not taking: Reported on 03/16/2024) 120 capsule 10   metoprolol  succinate (TOPROL -XL) 25 MG 24 hr tablet Take 1/2 (one-half) tablet by mouth once daily (Patient taking differently: Take 12.5 mg by mouth daily.) 45 tablet 2   oxyCODONE -acetaminophen  (PERCOCET/ROXICET) 5-325 MG tablet One tablet every four hours as need for pain,  5 day limit. 25 tablet 0   oxyCODONE -acetaminophen  (PERCOCET/ROXICET) 5-325 MG tablet Take 1 tablet by mouth every 6 (six) hours as needed for severe pain (pain score 7-10). 6 tablet 0   senna-docusate (SENNA S) 8.6-50 MG tablet Take 1-2  tablets by mouth See admin instructions. Take 2 tablets in the morning and 2 tablet at night     traMADol  (ULTRAM ) 50 MG tablet Take 1 tablet (50 mg total) by mouth every 6 (six) hours as needed. 15 tablet 0   No current facility-administered medications for this visit.    REVIEW OF SYSTEMS:   Constitutional: ( - ) fevers, ( - )  chills , ( - ) night sweats Eyes: ( - ) blurriness of vision, ( - ) double vision, ( - ) watery eyes Ears, nose, mouth, throat, and face: ( - ) mucositis, ( - ) sore throat Respiratory: ( - ) cough, ( - ) dyspnea, ( - ) wheezes Cardiovascular: ( - ) palpitation, ( - ) chest discomfort, ( - ) lower extremity swelling Gastrointestinal:  ( - ) nausea, ( - ) heartburn, ( - )  change in bowel habits Skin: ( - ) abnormal skin rashes Lymphatics: ( - ) new lymphadenopathy, ( - ) easy bruising Neurological: ( - ) numbness, ( - ) tingling, ( - ) new weaknesses Behavioral/Psych: ( - ) mood change, ( - ) new changes  All other systems were reviewed with the patient and are negative.  PHYSICAL EXAMINATION:  Vitals:   06/01/24 1437  BP: 138/86  Pulse: 87  Resp: 15  Temp: 98 F (36.7 C)  SpO2: 98%    Filed Weights   06/01/24 1437  Weight: 211 lb 6.4 oz (95.9 kg)   GENERAL: Well-appearing middle-aged Caucasian male alert, no distress and comfortable SKIN: skin color, texture, turgor are normal, no rashes or significant lesions EYES: conjunctiva are pink and non-injected, sclera clear LUNGS: clear to auscultation and percussion with normal breathing effort HEART: regular rate & rhythm and no murmurs and no lower extremity edema Musculoskeletal: no cyanosis of digits and no clubbing  PSYCH: alert & oriented x 3, fluent speech NEURO: no focal motor/sensory deficits  LABORATORY DATA:  I have reviewed the data as listed    Latest Ref Rng & Units 06/01/2024    1:48 PM 04/21/2024    1:59 PM 12/09/2023    7:52 AM  CBC  WBC 4.0 - 10.5 K/uL 8.4  9.0  7.9   Hemoglobin  13.0 - 17.0 g/dL 82.9  84.0  82.2   Hematocrit 39.0 - 52.0 % 50.5  47.5  52.6   Platelets 150 - 400 K/uL 126  74  65        Latest Ref Rng & Units 06/01/2024    1:48 PM 04/21/2024    1:59 PM 12/09/2023    7:52 AM  CMP  Glucose 70 - 99 mg/dL 882  96  98   BUN 6 - 20 mg/dL 14  14  10    Creatinine 0.61 - 1.24 mg/dL 8.77  9.03  8.74   Sodium 135 - 145 mmol/L 140  134  142   Potassium 3.5 - 5.1 mmol/L 4.2  4.1  4.4   Chloride 98 - 111 mmol/L 104  102  107   CO2 22 - 32 mmol/L 31  18  31    Calcium  8.9 - 10.3 mg/dL 89.9  8.4  9.4   Total Protein 6.5 - 8.1 g/dL 7.8   7.2   Total Bilirubin 0.0 - 1.2 mg/dL 0.6   0.8   Alkaline Phos 38 - 126 U/L 119   95   AST 15 - 41 U/L 22   26   ALT 0 - 44 U/L 25   32     RADIOGRAPHIC STUDIES: US  Venous Img Lower Unilateral Right (DVT) Result Date: 05/11/2024 CLINICAL DATA:  Right leg pain and edema EXAM: RIGHT LOWER EXTREMITY VENOUS DOPPLER ULTRASOUND TECHNIQUE: Gray-scale sonography with graded compression, as well as color Doppler and duplex ultrasound were performed to evaluate the lower extremity deep venous systems from the level of the common femoral vein and including the common femoral, femoral, profunda femoral, popliteal and calf veins including the posterior tibial, peroneal and gastrocnemius veins when visible. Spectral Doppler was utilized to evaluate flow at rest and with distal augmentation maneuvers in the common femoral, femoral and popliteal veins. COMPARISON:  None Available. FINDINGS: Contralateral Common Femoral Vein: Respiratory phasicity is normal and symmetric with the symptomatic side. No evidence of thrombus. Normal compressibility. Common Femoral Vein: No evidence of thrombus. Normal compressibility, respiratory phasicity and response to augmentation. Saphenofemoral  Junction: No evidence of thrombus. Normal compressibility and flow on color Doppler imaging. Profunda Femoral Vein: No evidence of thrombus. Normal compressibility and flow on  color Doppler imaging. Femoral Vein: No evidence of thrombus. Normal compressibility, respiratory phasicity and response to augmentation. Popliteal Vein: No evidence of thrombus. Normal compressibility, respiratory phasicity and response to augmentation. Calf Veins: No evidence of thrombus. Normal compressibility and flow on color Doppler imaging. Other Findings: Complex right popliteal fossa Baker's cyst measures 5.0 x 1.2 x 2.8 cm with internal mixed echogenicity debris or age-indeterminate hemorrhage. IMPRESSION: 1. Negative for right lower extremity DVT. 2. Complex right popliteal fossa Baker's cyst. Electronically Signed   By: CHRISTELLA.  Shick M.D.   On: 05/11/2024 12:48    ASSESSMENT & PLAN Milo Schreier is a 58 y.o. male who presents to the hematology clinic for evaluation of acute pulmonary embolism. We reviewed possible etiologies for venous thromboembolisms including prolonged travel/immobility, surgery (particular abdominal or orthropedic), trauma,  and pregnancy/ estrogen containing birth control. After a detailed history and review of the records there is no clear provoking factor for this patient's VTE.  Patients with unprovoked VTEs have up to 25% recurrence after 5 years and 36% at 10 years, with 4% of these clots being fatal ([phone]). Therefore the formal recommendation for unprovoked VTE's is lifelong anticoagulation, as the cause may not be transient or reversible. We recommend 6 months or full strength anticoagulation with a re-evaluation after that time.  The patient's will then have a choice of maintenance dose DOAC (preferred, recommended), 81mg  ASA PO daily (non-preferred), or no further anticoagulation (not recommended).    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  Dose increased per  cardiology.  Continue with this dosage. --patient denies any bleeding, bruising, or dark stools on this medication. It is well tolerated. No difficulties accessing/affording the medication --Labs today show white blood cell count 8.4, Hgb 17.0, MCV 88.0, Plt 126. Creatinine 1.22.  Okay to continue on full-strength Eliquis  therapy as long as platelets are greater than 50 --RTC in 6 months' time with strict return precautions for overt signs of bleeding.   #Thrombocytopenia -- Nutritional and viral workup were both negative. -- Patient has a low immature platelet fraction, does not support the diagnosis of ITP -- Recommend pursuing a ultrasound of the abdomen to assess for liver disease/splenomegaly.  Patient declined to have this performed.  We do have prior imaging of the abdomen from August 2023 which shows no evidence of cirrhosis or splenomegaly. 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  Patient reports a large portion of his liver was removed --Patient has large ventral hernia secondary to his liver operation. -- Continue to monitor  No orders of the defined types were placed in this encounter.   All questions were answered. The patient knows to call the clinic with any problems, questions or concerns.  A total of more than 25 minutes were spent on this encounter with face-to-face time and non-face-to-face time, including preparing to see the patient, ordering tests and/or medications, counseling the patient and coordination of care as outlined above.   Norleen IVAR Kidney, MD Department of Hematology/Oncology [facility] at [facility] Phone: [phone] Pager: [phone] Email:  [email or web address]  06/01/2024 2:46 PM

## 2024-06-06 ENCOUNTER — Other Ambulatory Visit: Payer: Self-pay | Admitting: Cardiovascular Disease

## 2024-06-22 ENCOUNTER — Encounter: Payer: Self-pay | Admitting: Orthopedic Surgery

## 2024-06-22 ENCOUNTER — Ambulatory Visit: Admitting: Orthopedic Surgery

## 2024-06-22 DIAGNOSIS — M8440XA Pathological fracture, unspecified site, initial encounter for fracture: Secondary | ICD-10-CM | POA: Diagnosis not present

## 2024-06-22 DIAGNOSIS — M25461 Effusion, right knee: Secondary | ICD-10-CM | POA: Diagnosis not present

## 2024-06-22 MED ORDER — METHYLPREDNISOLONE ACETATE 40 MG/ML IJ SUSP
40.0000 mg | Freq: Once | INTRAMUSCULAR | Status: AC
  Administered 2024-06-22: 40 mg via INTRA_ARTICULAR

## 2024-06-22 MED ORDER — METHYLPREDNISOLONE 4 MG PO TBPK: ORAL_TABLET | ORAL | 0 refills | Status: AC

## 2024-06-22 MED ORDER — OXYCODONE-ACETAMINOPHEN 5-325 MG PO TABS: 1.0000 | ORAL_TABLET | Freq: Four times a day (QID) | ORAL | 0 refills | Status: AC | PRN

## 2024-06-22 NOTE — Patient Instructions (Signed)
 Wear the Ace wrap for 3 days  Apply ice to the knee for 20 minutes 3 times a day until you see the doctor in 4 weeks   Take the Medrol  to help with the swelling you will take that for 5 days  Take Percocet for pain when needed  Wear the brace for all weightbearing activities  Joint Steroid Injection A joint steroid injection is a procedure to relieve swelling and pain in a joint. Steroids are medicines that reduce inflammation. In this procedure, your health care provider uses a syringe and a needle to inject a steroid medicine into a painful and inflamed joint. A pain-relieving medicine (anesthetic) may be injected along with the steroid. In some cases, your health care provider may use an imaging technique such as ultrasound or fluoroscopy to guide the injection. Joints that are often treated with steroid injections include the knee, shoulder, hip, and spine. These injections may also be used in the elbow, ankle, and joints of the hands or feet. You may have joint steroid injections as part of your treatment for inflammation caused by: Gout. Rheumatoid arthritis. Advanced wear-and-tear arthritis (osteoarthritis). Tendinitis. Bursitis. Joint steroid injections may be repeated, but having them too often can damage a joint or the skin over the joint. You should not have joint steroid injections less than 6 weeks apart or more than four times a year. Tell a health care provider about: Any allergies you have. All medicines you are taking, including vitamins, herbs, eye drops, creams, and over-the-counter medicines. Any problems you or family members have had with anesthetic medicines. Any blood disorders you have. Any surgeries you have had. Any medical conditions you have. Whether you are pregnant or may be pregnant. What are the risks? Generally, this is a safe treatment. However, problems may occur, including: Infection. Bleeding. Allergic reactions to medicines. Damage to the joint  or tissues around the joint. Thinning of skin or loss of skin color over the joint. Temporary flushing of the face or chest. Temporary increase in pain. Temporary increase in blood sugar. Failure to relieve inflammation or pain. What happens before the treatment? Medicines Ask your health care provider about: Changing or stopping your regular medicines. This is especially important if you are taking diabetes medicines or blood thinners. Taking medicines such as aspirin  and ibuprofen. These medicines can thin your blood. Do not take these medicines unless your health care provider tells you to take them. Taking over-the-counter medicines, vitamins, herbs, and supplements. General instructions You may have imaging tests of your joint. Ask your health care provider if you can drive yourself home after the procedure. What happens during the treatment?  Your health care provider will position you for the injection and locate the injection site over your joint. The skin over the joint will be cleaned with a germ-killing soap. Your health care provider may: Spray a numbing solution (topical anesthetic) over the injection site. Inject a local anesthetic under the skin above your joint. The needle will be placed through your skin into your joint. Your health care provider may use imaging to guide the needle to the right spot for the injection. If imaging is used, a special contrast dye may be injected to confirm that the needle is in the correct location. The steroid medicine will be injected into your joint. Anesthetic may be injected along with the steroid. This may be a medicine that relieves pain for a short time (short-acting anesthetic) or for a longer time (long-acting anesthetic). The needle  will be removed, and an adhesive bandage (dressing) will be placed over the injection site. The procedure may vary among health care providers and hospitals. What can I expect after the treatment? You  will be able to go home after the treatment. It is normal to feel slight flushing for a few days after the injection. After the treatment, it is common to have an increase in joint pain after the anesthetic has worn off. This may happen about an hour after a short-acting anesthetic or about 8 hours after a longer-acting anesthetic. You should begin to feel relief from joint pain and swelling after 24 to 48 hours. Contact your health care provider if you do not begin to feel relief after 2 days. Follow these instructions at home: Injection site care Leave the adhesive dressing over your injection site in place until your health care provider says you can remove it. Check your injection site every day for signs of infection. Check for: More redness, swelling, or pain. Fluid or blood. Warmth. Pus or a bad smell. Activity Return to your normal activities as told by your health care provider. Ask your health care provider what activities are safe for you. You may be asked to limit activities that put stress on the joint for a few days. Do joint exercises as told by your health care provider. Do not take baths, swim, or use a hot tub until your health care provider approves. Ask your health care provider if you may take showers. You may only be allowed to take sponge baths. Managing pain, stiffness, and swelling  If directed, put ice on the joint. To do this: Put ice in a plastic bag. Place a towel between your skin and the bag. Leave the ice on for 20 minutes, 2-3 times a day. Remove the ice if your skin turns bright red. This is very important. If you cannot feel pain, heat, or cold, you have a greater risk of damage to the area. Raise (elevate) your joint above the level of your heart when you are sitting or lying down. General instructions Take over-the-counter and prescription medicines only as told by your health care provider. Do not use any products that contain nicotine  or tobacco, such  as cigarettes, e-cigarettes, and chewing tobacco. These can delay joint healing. If you need help quitting, ask your health care provider. If you have diabetes, be aware that your blood sugar may be slightly elevated for several days after the injection. Keep all follow-up visits. This is important. Contact a health care provider if you have: Chills or a fever. Any signs of infection at your injection site. Increased pain or swelling or no relief after 2 days. Summary A joint steroid injection is a treatment to relieve pain and swelling in a joint. Steroids are medicines that reduce inflammation. Your health care provider may add an anesthetic along with the steroid. You may have joint steroid injections as part of your arthritis treatment. Joint steroid injections may be repeated, but having them too often can damage a joint or the skin over the joint. Contact your health care provider if you have a fever, chills, or signs of infection, or if you get no relief from joint pain or swelling. This information is not intended to replace advice given to you by your health care provider. Make sure you discuss any questions you have with your health care provider. Document Revised: 01/20/2020 Document Reviewed: 01/20/2020 Elsevier Patient Education  2024 [employer].

## 2024-06-22 NOTE — Progress Notes (Signed)
    06/22/2024   Chief Complaint  Patient presents with   Knee Pain    Right     Encounter Diagnosis  Name Primary?   Insufficiency fracture Yes    What pharmacy do you use ? _____[facility] [place]____  DOI/DOS/ Date:    Unchanged pain and swelling continue/ wearing brace

## 2024-06-22 NOTE — Progress Notes (Signed)
   Patient: Bob Ross           Date of Birth: 07/17/1966           MRN: [id number] Visit Date: 06/22/2024 Requested by: Gerome Tillman CROME, FNP [address] [place],  [place] [address] PCP: Gerome Tillman CROME, FNP  Encounter Diagnoses  Name Primary?   Insufficiency fracture    Effusion, right knee Yes    Assessment and plan:  Continue out of work next 5 weeks I will be out the fourth week Continue brace Ice 3 times a day Ace wrap for 3 days Medrol  5 days Percocet  58 year old male on Eliquis  secondary to an aortic mechanical valve Has made some improvement with restricted activity and hinged knee brace for insufficiency fracture medial  tibia  We did an aspiration injection  We obtained 24 cc of yellow fluid And injected 40 mg of Depo-Medrol   Procedure :  Aspiration and injection  verbal consent was obtained to aspirate and inject the  right  knee joint   Timeout was completed to confirm the site of aspiration and injection   Medication:  40 mg of Depo-Medrol  and 1% lidocaine  3 cc  Anesthesia was provided by ethyl chloride and the skin was prepped with alcohol.  After cleaning the skin with alcohol an 18-gauge needle was used to aspirate the right knee joint.  Fluid obtained: 24cc  Color:  yel  We followed this by injection of 40 mg of Depo-Medrol  and 3 cc 1% lidocaine .  There were no complications. A sterile bandage was applied.   Meds ordered this encounter  Medications   methylPREDNISolone  acetate (DEPO-MEDROL ) injection 40 mg   methylPREDNISolone  (MEDROL  DOSEPAK) 4 MG TBPK tablet    Sig: 4 mg 6 days    Dispense:  21 tablet    Refill:  0   oxyCODONE -acetaminophen  (PERCOCET/ROXICET) 5-325 MG tablet    Sig: Take 1 tablet by mouth every 6 (six) hours as needed for up to 5 days for severe pain (pain score 7-10).    Dispense:  20 tablet    Refill:  0     Chief Complaint  Patient presents with   Knee Pain    Right     History:  58 year old male history  of aortic valve on Eliquis  recurrent pain swelling insufficiency fracture right knee  MRI and x-rays were done by Dr. Brenna I took over the care from Dr. Brenna upon his retirement  After 6 weeks of protected weightbearing of the brace and activity modification including being out of work he has made some improvement in his medial knee pain but today comes in complaining of swelling recurrent effusion and giving way sensation as if the knee will go into hyperextension  Focused exam findings:  Moderate joint effusion right knee patient does not go into hyperextension he actually cannot reach full extension he has a slight flexion contracture with an effusion and flexion up to 110 degrees active before he gets tightness in the knee.  I do not detect any instability in the knee

## 2024-06-27 ENCOUNTER — Encounter: Payer: Self-pay | Admitting: Radiology

## 2024-07-27 ENCOUNTER — Ambulatory Visit (INDEPENDENT_AMBULATORY_CARE_PROVIDER_SITE_OTHER): Admitting: Orthopedic Surgery

## 2024-07-27 ENCOUNTER — Ambulatory Visit: Admitting: Orthopedic Surgery

## 2024-07-27 DIAGNOSIS — M25461 Effusion, right knee: Secondary | ICD-10-CM | POA: Diagnosis not present

## 2024-07-27 DIAGNOSIS — M8440XA Pathological fracture, unspecified site, initial encounter for fracture: Secondary | ICD-10-CM | POA: Diagnosis not present

## 2024-07-27 DIAGNOSIS — M1711 Unilateral primary osteoarthritis, right knee: Secondary | ICD-10-CM | POA: Diagnosis not present

## 2024-07-27 NOTE — Progress Notes (Signed)
    07/27/2024   Chief Complaint  Patient presents with   Knee Pain    Right- feeling better able to hang christmas light up on ladder has form to fill out to return to work     No diagnosis found.  What pharmacy do you use ? ____[facility]_______________________  DOI/DOS/ Date: 04/21/2024  Did you get better, worse or no change (Answer below)   Improved

## 2024-07-27 NOTE — Addendum Note (Signed)
 Addended byBETHA JENEAN GREIG LELON on: 07/27/2024 10:23 AM   Modules accepted: Orders

## 2024-07-27 NOTE — Progress Notes (Signed)
   Chief Complaint  Patient presents with   Knee Pain    Right- feeling better able to hang christmas light up on ladder has form to fill out to return to work     58 year old male right knee pain insufficiency stress reaction medial tibia arthritis without meniscal tear treated with bracing rest activity modification made significant improvement including return to full range of motion  Asymptomatic now on the medial side with some mild soreness laterally  Knee Pain     PHYSICAL EXAM:  Focused right knee Full range of motion No instability   Assessment and Plan:   Encounter Diagnoses  Name Primary?   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

## 2024-07-27 NOTE — Patient Instructions (Signed)

## 2024-09-12 ENCOUNTER — Telehealth: Payer: Self-pay | Admitting: Orthopedic Surgery

## 2024-09-12 NOTE — Telephone Encounter (Signed)
 "  Message Received: Today Surrett, Hope H  [place], Jemari Hallum W, [place]; [place], April, [place] Hey,  I don't see any insurance on this pt's chart.  I tried calling all numbers on the account, I get This call can't be completed at this time  He hasn't used mychart in almost a year.  The follow appointment looks to be a 7 week follow appointment w/possible gel injection.  He may have to find out at this appointment that he's not getting it that day.  Let me know if there's anything else I can do.  Thanks,  [facility]       Previous Messages  Referral Referral # [id number] Referral Information  Referral # Creation Date Referral Status Status Update   [id number] 07/27/2024 Pending Review 07/27/2024: Status History     Status Reason Referral Type Referral Reasons Referral Class  Coverage Expired-Auto Pend Consultation none Internal     To Provider Specialty To Provider To Location/Place of Service To Department  none none none none    To Vendor Referred By By Location/Place of Service By Department  none Margrette Taft BRAVO, MD [facility] [place] [facility] [place]     Priority Start Date Expiration Date Referral Entered By  Routine 07/27/2024 07/27/2025 Jenean Greig ORN, RT     Visits Requested Visits Authorized Visits Completed Visits Scheduled  1 1      Procedure Information  Service Details Procedure Modifiers Provider Requested Approved  [id number] - AMB REQUEST FOR INJECTION MEDICATION none  1 1   Diagnosis Information  Diagnosis  M17.11 (ICD-10-CM) - Primary osteoarthritis of right knee   Referral Notes Number of Notes: 10 . Type Date User Summary Attachment  General 09/12/2024  9:27 AM Shakir Petrosino, Greig ORN, RT Auto: Referral message -  Note: ----- Message ----- From: Jenean Greig ORN, RT Sent: 09/12/2024   9:27 AM EST To: Hope H Surrett   No but thank you for trying ----- Message ----- From: Wallene Slice H Sent: 09/12/2024   8:39 AM EST To: Greig ORN Jenean, RT; April Jackson, RMA   Hey,   I don't see any insurance on this pt's chart.  I tried calling all numbers on the account, I get This call can't be completed at this time  He hasn't used mychart in almost a year.  The follow appointment looks to be a 7 week follow appointment w/possible gel injection.  He may have to find out at this appointment that he's not getting it that day.  Let me know if there's anything else I can do.   Thanks,   Hope ----- Message ----- From: Jenean Greig ORN, RT Sent: 09/12/2024   8:18 AM EST To: Hope H Surrett   Im in [place] today can you check insurance, he is scheduled for HA but they are not approved ----- Message ----- From: Leonce Earing, RMA Sent: 09/09/2024   1:14 PM EST To: Greig ORN Jenean, RT   Does he have a new insurance? ----- Message ----- From: Jenean Greig ORN, RT Sent: 09/09/2024  11:46 AM EST To: April Jackson, RMA   He is coming in next week Is there approval for the HA injections? . Type Date User Summary Attachment  General 09/12/2024  8:39 AM Surrett, Hope H Auto: Referral message -  Note: ----- Message ----- From: Wallene Slice H Sent: 09/12/2024   8:39 AM EST To: Greig ORN Jenean, RT; April Jackson, RMA   Hey,   I don't see any  insurance on this pt's chart.  I tried calling all numbers on the account, I get This call can't be completed at this time  He hasn't used mychart in almost a year.  The follow appointment looks to be a 7 week follow appointment w/possible gel injection.  He may have to find out at this appointment that he's not getting it that day.  Let me know if there's anything else I can do.   Thanks,   Hope ----- Message ----- From: Jenean Greig ORN, RT Sent: 09/12/2024   8:18 AM EST To: Hope H Surrett   Im in [place] today can you check insurance, he is scheduled for HA but they are not approved ----- Message ----- From: Leonce Earing, RMA Sent: 09/09/2024   1:14 PM EST To: Greig ORN Jenean, RT   Does he have a new insurance? ----- Message ----- From: Jenean Greig ORN, RT Sent: 09/09/2024  11:46 AM EST To: April Jackson, RMA   He is coming in next week Is there approval for the HA injections? . Type Date User Summary Attachment  General 09/12/2024  8:18 AM Alexys Lobello, Greig ORN, RT Auto: Referral message -  Note: ----- Message ----- From: Jenean Greig ORN, RT Sent: 09/12/2024   8:18 AM EST To: Hope H Surrett   Im in [place] today can you check insurance, he is scheduled for HA but they are not approved ----- Message ----- From: Leonce Earing, RMA Sent: 09/09/2024   1:14 PM EST To: Greig ORN Jenean, RT   Does he have a new insurance? ----- Message ----- From: Jenean Greig ORN, RT Sent: 09/09/2024  11:46 AM EST To: April Jackson, RMA   He is coming in next week Is there approval for the HA injections? . Type Date User Summary Attachment  General 09/09/2024  1:14 PM Leonce Earing, [place] Auto: Referral message -  Note: ----- Message ----- From: Leonce Earing, RMA Sent: 09/09/2024   1:14 PM EST To: Greig ORN Jenean, RT   Does he have a new insurance? ----- Message ----- From: Jenean Greig ORN, RT Sent: 09/09/2024  11:46 AM EST To: April Jackson, RMA   He is coming in next week Is there approval for the HA injections? . Type Date User Summary Attachment  General 09/09/2024  1:30 PM Leonce Earing, RMA VOB submitted for Orthovisc, right knee -  Note: VOB submitted for Orthovisc, right knee   . Type Date User Summary Attachment  General 09/09/2024 11:46 AM Dhanya Bogle, Greig ORN, RT Auto: Referral message -  Note: ----- Message ----- From: Jenean Greig ORN, RT Sent: 09/09/2024  11:46 AM EST To: April Jackson, RMA   He is coming in next week Is there approval for the HA injections? . Type Date User Summary Attachment  General 07/27/2024  4:16 PM Leonce Earing, [place] Auto: Referral message -  Note: ----- Message ----- From: Leonce Earing, RMA Sent:  07/27/2024   4:16 PM EST To: Greig ORN Jenean, RT   Noted.  Thank you. ----- Message ----- From: Jenean Greig ORN, RT Sent: 07/27/2024  10:24 AM EST To: April Jackson, RMA   He wants this end of January . Type Date User Summary Attachment  General 07/27/2024  4:14 PM Leonce Earing, RMA Will submit in January, 2026 for gel injection -  Note: Will submit in January, 2026 for gel injection . Type Date User Summary Attachment  General 07/27/2024 10:24 AM Michaila Kenney, Greig ORN, RT Auto: Referral message -  Note: ----- Message -----  From: Jenean Greig ORN, RT Sent: 07/27/2024  10:24 AM EST To: April Jackson, [place]   He wants this end of January . Type Date User Summary Attachment  Provider Comments 07/27/2024 10:23 AM Jenean Greig ORN, RT Provider Comments -  Note: Right knee Hyaluronic acid injections Referral Order  Order  Ambulatory request for injection medication (Order # [id number]) on 07/27/2024  View Encounter     "

## 2024-09-14 ENCOUNTER — Ambulatory Visit (INDEPENDENT_AMBULATORY_CARE_PROVIDER_SITE_OTHER): Admitting: Orthopedic Surgery

## 2024-09-14 ENCOUNTER — Encounter: Payer: Self-pay | Admitting: Orthopedic Surgery

## 2024-09-14 DIAGNOSIS — M8440XA Pathological fracture, unspecified site, initial encounter for fracture: Secondary | ICD-10-CM | POA: Diagnosis not present

## 2024-09-14 DIAGNOSIS — M1711 Unilateral primary osteoarthritis, right knee: Secondary | ICD-10-CM | POA: Diagnosis not present

## 2024-09-14 MED ORDER — METHYLPREDNISOLONE ACETATE 40 MG/ML IJ SUSP
40.0000 mg | Freq: Once | INTRAMUSCULAR | Status: AC
  Administered 2024-09-14: 40 mg via INTRA_ARTICULAR

## 2024-09-14 NOTE — Progress Notes (Signed)
" ° ° °  09/14/2024   Chief Complaint  Patient presents with   Knee Pain    right    No diagnosis found.  What pharmacy do you use ? ___[facility]________  DOI/DOS/  Did you get better, worse or no change (Answer below)   Worse   Unable to get approval for hyaluronic acid injections for this visit did not have his insurance information on file.   "

## 2024-09-14 NOTE — Patient Instructions (Signed)

## 2024-09-14 NOTE — Progress Notes (Signed)
" ° °  Chief Complaint  Patient presents with   Knee Pain    right    59 year old male with OA right knee considered for HA injection however his insurance changed we did not have the information and it was never approved  Severe pain after 11,000 steps at work yesterday including stair climbing complains of quadriceps pain at the insertion of the patella as well as global knee pain  Knee Pain     PHYSICAL EXAM:   Tenderness over the right knee at the quadriceps tendon mild effusion decreased range of motion  Assessment and Plan:   Encounter Diagnoses  Name Primary?   Insufficiency fracture Yes   Primary osteoarthritis of right knee    Inject right knee  Procedure note right knee injection   verbal consent was obtained to inject right knee joint  Timeout was completed to confirm the site of injection  The medications used were depomedrol 40 mg and 1% lidocaine  3 cc Anesthesia was provided by ethyl chloride and the skin was prepped with alcohol.  After cleaning the skin with alcohol a 20-gauge needle was used to inject the right knee joint. There were no complications. A sterile bandage was applied.   "

## 2024-10-03 ENCOUNTER — Ambulatory Visit: Admitting: Orthopedic Surgery

## 2024-10-12 ENCOUNTER — Ambulatory Visit: Admitting: Orthopedic Surgery

## 2024-10-21 ENCOUNTER — Ambulatory Visit: Admitting: Orthopedic Surgery

## 2024-11-30 ENCOUNTER — Inpatient Hospital Stay: Admitting: Hematology and Oncology

## 2024-11-30 ENCOUNTER — Inpatient Hospital Stay

## 2025-03-16 ENCOUNTER — Other Ambulatory Visit (HOSPITAL_COMMUNITY)
